# Patient Record
Sex: Female | Born: 1935
Health system: Southern US, Community
[De-identification: ages and names within clinical notes are randomized; demographics above are authoritative.]

## PROBLEM LIST (undated history)

## (undated) DIAGNOSIS — I10 Essential (primary) hypertension: Secondary | ICD-10-CM

## (undated) DIAGNOSIS — K5730 Diverticulosis of large intestine without perforation or abscess without bleeding: Secondary | ICD-10-CM

## (undated) DIAGNOSIS — M1990 Unspecified osteoarthritis, unspecified site: Secondary | ICD-10-CM

## (undated) DIAGNOSIS — E039 Hypothyroidism, unspecified: Secondary | ICD-10-CM

## (undated) DIAGNOSIS — E119 Type 2 diabetes mellitus without complications: Secondary | ICD-10-CM

## (undated) DIAGNOSIS — E7800 Pure hypercholesterolemia, unspecified: Secondary | ICD-10-CM

## (undated) DIAGNOSIS — K859 Acute pancreatitis without necrosis or infection, unspecified: Secondary | ICD-10-CM

## (undated) HISTORY — PX: ABDOMINAL HYSTERECTOMY: SHX81

## (undated) HISTORY — PX: OTHER SURGICAL HISTORY: SHX169

## (undated) HISTORY — PX: ESOPHAGOGASTRODUODENOSCOPY: SHX1529

## (undated) HISTORY — PX: EXCISION OF BREAST BIOPSY: SHX5822

## (undated) HISTORY — PX: VEIN SURGERY: SHX48

## (undated) HISTORY — PX: APPENDECTOMY: SHX54

---

## 1997-02-10 HISTORY — PX: CHOLECYSTECTOMY: SHX55

## 1997-05-15 ENCOUNTER — Ambulatory Visit (HOSPITAL_COMMUNITY): Admission: RE | Admit: 1997-05-15 | Discharge: 1997-05-15 | Payer: Self-pay | Admitting: Specialist

## 1997-07-03 ENCOUNTER — Ambulatory Visit (HOSPITAL_COMMUNITY): Admission: RE | Admit: 1997-07-03 | Discharge: 1997-07-03 | Payer: Self-pay | Admitting: Specialist

## 1999-02-25 ENCOUNTER — Encounter: Admission: RE | Admit: 1999-02-25 | Discharge: 1999-02-25 | Payer: Self-pay | Admitting: Family Medicine

## 1999-02-25 ENCOUNTER — Encounter: Payer: Self-pay | Admitting: Family Medicine

## 1999-12-10 ENCOUNTER — Encounter: Payer: Self-pay | Admitting: Family Medicine

## 1999-12-10 ENCOUNTER — Encounter: Admission: RE | Admit: 1999-12-10 | Discharge: 1999-12-10 | Payer: Self-pay | Admitting: Family Medicine

## 2000-03-16 ENCOUNTER — Ambulatory Visit (HOSPITAL_COMMUNITY): Admission: RE | Admit: 2000-03-16 | Discharge: 2000-03-16 | Payer: Self-pay | Admitting: Family Medicine

## 2000-03-16 ENCOUNTER — Encounter: Payer: Self-pay | Admitting: Family Medicine

## 2000-05-05 ENCOUNTER — Encounter: Admission: RE | Admit: 2000-05-05 | Discharge: 2000-05-05 | Payer: Self-pay | Admitting: Family Medicine

## 2000-05-05 ENCOUNTER — Encounter: Payer: Self-pay | Admitting: Family Medicine

## 2000-12-11 HISTORY — PX: COLONOSCOPY: SHX174

## 2001-04-21 ENCOUNTER — Ambulatory Visit (HOSPITAL_COMMUNITY): Admission: RE | Admit: 2001-04-21 | Discharge: 2001-04-21 | Payer: Self-pay | Admitting: Family Medicine

## 2001-04-21 ENCOUNTER — Encounter: Payer: Self-pay | Admitting: Family Medicine

## 2002-05-16 ENCOUNTER — Ambulatory Visit (HOSPITAL_COMMUNITY): Admission: RE | Admit: 2002-05-16 | Discharge: 2002-05-16 | Payer: Self-pay | Admitting: Family Medicine

## 2002-05-16 ENCOUNTER — Encounter: Payer: Self-pay | Admitting: Family Medicine

## 2003-02-01 ENCOUNTER — Encounter: Admission: RE | Admit: 2003-02-01 | Discharge: 2003-02-01 | Payer: Self-pay | Admitting: Internal Medicine

## 2003-06-14 ENCOUNTER — Ambulatory Visit (HOSPITAL_COMMUNITY): Admission: RE | Admit: 2003-06-14 | Discharge: 2003-06-14 | Payer: Self-pay | Admitting: Obstetrics & Gynecology

## 2004-06-17 ENCOUNTER — Ambulatory Visit (HOSPITAL_COMMUNITY): Admission: RE | Admit: 2004-06-17 | Discharge: 2004-06-17 | Payer: Self-pay | Admitting: Family Medicine

## 2004-12-31 ENCOUNTER — Ambulatory Visit (HOSPITAL_COMMUNITY): Admission: RE | Admit: 2004-12-31 | Discharge: 2004-12-31 | Payer: Self-pay | Admitting: Family Medicine

## 2005-06-18 ENCOUNTER — Ambulatory Visit (HOSPITAL_COMMUNITY): Admission: RE | Admit: 2005-06-18 | Discharge: 2005-06-18 | Payer: Self-pay | Admitting: Family Medicine

## 2006-06-19 ENCOUNTER — Ambulatory Visit (HOSPITAL_COMMUNITY): Admission: RE | Admit: 2006-06-19 | Discharge: 2006-06-19 | Payer: Self-pay | Admitting: Family Medicine

## 2006-09-28 ENCOUNTER — Encounter: Admission: RE | Admit: 2006-09-28 | Discharge: 2006-09-28 | Payer: Self-pay | Admitting: Family Medicine

## 2006-12-31 ENCOUNTER — Ambulatory Visit: Payer: Self-pay | Admitting: Internal Medicine

## 2007-01-11 HISTORY — PX: COLONOSCOPY: SHX174

## 2007-01-14 ENCOUNTER — Ambulatory Visit: Payer: Self-pay | Admitting: Internal Medicine

## 2007-06-23 ENCOUNTER — Ambulatory Visit (HOSPITAL_COMMUNITY): Admission: RE | Admit: 2007-06-23 | Discharge: 2007-06-23 | Payer: Self-pay | Admitting: Family

## 2007-07-02 ENCOUNTER — Encounter: Admission: RE | Admit: 2007-07-02 | Discharge: 2007-07-02 | Payer: Self-pay | Admitting: Family

## 2007-10-14 ENCOUNTER — Encounter: Payer: Self-pay | Admitting: Emergency Medicine

## 2007-10-15 ENCOUNTER — Inpatient Hospital Stay (HOSPITAL_COMMUNITY): Admission: EM | Admit: 2007-10-15 | Discharge: 2007-10-18 | Payer: Self-pay

## 2007-10-27 ENCOUNTER — Encounter: Admission: RE | Admit: 2007-10-27 | Discharge: 2007-10-27 | Payer: Self-pay | Admitting: Orthopedic Surgery

## 2008-07-04 ENCOUNTER — Ambulatory Visit (HOSPITAL_COMMUNITY): Admission: RE | Admit: 2008-07-04 | Discharge: 2008-07-04 | Payer: Self-pay | Admitting: Family Medicine

## 2009-07-12 ENCOUNTER — Ambulatory Visit (HOSPITAL_COMMUNITY): Admission: RE | Admit: 2009-07-12 | Discharge: 2009-07-12 | Payer: Self-pay | Admitting: Family Medicine

## 2010-06-06 ENCOUNTER — Other Ambulatory Visit (HOSPITAL_COMMUNITY): Payer: Self-pay | Admitting: Family Medicine

## 2010-06-06 DIAGNOSIS — Z1231 Encounter for screening mammogram for malignant neoplasm of breast: Secondary | ICD-10-CM

## 2010-06-06 DIAGNOSIS — Z780 Asymptomatic menopausal state: Secondary | ICD-10-CM

## 2010-06-25 NOTE — Consult Note (Signed)
NAMEJOURDIN, Kathleen [name]                 ACCOUNT NO.:  [id number]   MEDICAL RECORD NO.:  [id number]          PATIENT TYPE:  INP   LOCATION:  3030                         FACILITY:  [facility]   PHYSICIAN:  Doralee Albino. Carola Frost, M.D. DATE OF BIRTH:  28-Aug-1931   DATE OF CONSULTATION:  10/15/2007  DATE OF DISCHARGE:                                 CONSULTATION   REQUESTING PHYSICIAN:  Trauma Service.   REASON FOR CONSULTATION:  Left knee hematoma and contusion.   HISTORY OF PRESENT ILLNESS:  Kathleen [name] is a 77 year old Caucasian female  who was at home yesterday and fell down while working in the backyard.  The patient states that she stepped in a hole and landed essentially  flat on her face per the patient's own words.  She was brought initially  to [facility] Emergency Department for evaluation.  She was also placed  in a cervical collar for C-spine precautions.  After being evaluated in  the ED, the Trauma Service was called for consultation as well.  Afterwards, the patient was then transferred to [facility] to  3000 unit.  Based on physical and historical findings from the patient's  exam, it was requested that Orthopedics be consulted for evaluation of  her left knee injury with possible aspiration of the knee.   Currently, Kathleen [name] is in bed.  She appears to be relatively tired.  She does answer questions appropriately and is conversing well.  She  does complain of some left knee pain primarily over the medial aspect.  She does not complain of any numbness or tingling.  She does have  significant complaints of vomiting which she believes is secondary to  pain medication.  Kathleen [name] pain is located primarily about her left  knee.  There is no radiation of pain down her leg or up into her back.  The pain is pretty much sharp in nature and she does get some relief of  pain while she remains still.  Any type of movement does exacerbate the  pain.   PAST MEDICAL HISTORY:   Significant for diabetes, hypertension,  hypothyroidism, and dyslipidemia.   SOCIAL HISTORY:  The patient does not drink.  Does not smoke.  She does  live at home with her husband.   ALLERGIES:  No known drug allergies.   MEDICATIONS:  1. Felodipine.  2. Hydrochlorothiazide.  3. Levothyroxine.  4. Metformin.  5. Potassium chloride.  6. Simvastatin.   PAST SURGICAL HISTORY:  The patient denies.   REVIEW OF SYSTEMS:  As noted above in the HPI.   PHYSICAL EXAMINATION:  VITAL SIGNS:  Temperature 98.0, heart rate 74, BP  113/67, respirations 20, and 93% on room air.  GENERAL:  Upon initial encounter, Kathleen [name] is resting in bed.  She is  in an Aspen neck collar.  She does appear to be fatigued and tired.  She  does, however, answer questions appropriately and accurately.  LUNGS:  Clear.  HEART:  Regular rate and rhythm.  ABDOMEN:  Soft and nontender with positive bowel sounds.  EXTREMITIES:  Initial inspection of  the left knee shows significantly  swollen and ecchymotic knee.  She is exquisitely tender to palpation  along the medial aspect of her left knee.  She does not appear to have  any significant tenderness or palpation of the bony landmarks of the  knee.  Nontender with palpation over the medial and lateral epicondyles  and medial and lateral tibial plateaus.  She is also nontender with  palpation of the patella.  Again, she is most tender along the medial  aspect of her knee.  Kathleen [name] is somewhat able to straighten her knee  and to do complete extension but does have some discomfort with this.  It is very difficult to get an accurate ligamentous exam secondary to  the patient's pain.  Also, it is difficult to further evaluate the  menisci for any additional damage as well.  Deep peroneal nerve,  superficial peroneal nerve, and tibial nerve sensation is intact to  light touch distally.  Extensor hallucis longus, flexor hallucis longus,  anterior tibialis, posterior  tibialis, peroneals, and gastroc-soleus  complex motor function is also intact distally.  There is a palpable  dorsalis pedis pulse.  Extremities warm with appropriate color.  I do  not appreciate any large abrasions or lacerations above her left knee.   LABORATORY DATA:  Sodium 140, potassium 3.4, chloride 108, CO2 27, and  glucose 146.  BUN 11 and creatinine 0.69.  White blood cell is 9.8,  hemoglobin 10.3, hematocrit 29.7, and platelets 127.   Plain films of her left knee do not suggest any evidence of an acute  fracture.  She does have some mild tricompartmental disease of her left  knee.  I do appreciate marked soft tissue swelling and subcu swelling,  but this appears to be extraarticular.  There may be a minimal if that  is left knee joint effusion.   ASSESSMENT:  Left knee hematoma and contusion without fracture.   PLAN:  Primary goal is to begin mobilization of the patient with  physical therapy.  She will be weightbearing as tolerated and can also  perform range of motion as tolerated.  She should continue with some  rest, ice, elevation of her left knee and may even benefit from an Ace  wrap to her left knee as well.  Given the fact that she is in acute pain  secondary to her injury, it is difficult to fully evaluate her injuries  due to the pain.  We will re-examine Kathleen [name] in a few days for her  ligamentous stability as the pain subsides.  Depending on the findings,  we may get Kathleen [name] a hinged brace to provide additional support.  We  will see Kathleen [name] again on Tuesday.  Dr. Thurston Hole and Dr. Sherlean Foot will be  covering over the weekend and we will check on her over the weekend to  make sure she is progressing well with physical therapy.      Mearl Latin, PA      Doralee Albino. Carola Frost, M.D.  Electronically Signed    KWP/MEDQ  D:  10/15/2007  T:  10/16/2007  Job:  [id number]   cc:   Trauma Service

## 2010-06-25 NOTE — H&P (Signed)
Kathleen [name], Kathleen [name]                 ACCOUNT NO.:  [id number]   MEDICAL RECORD NO.:  [id number]          PATIENT TYPE:  INP   LOCATION:  3030                         FACILITY:  [facility]   PHYSICIAN:  Ardeth Sportsman, MD     DATE OF BIRTH:  08-17-1931   DATE OF ADMISSION:  10/15/2007  DATE OF DISCHARGE:                              HISTORY & PHYSICAL   PRIMARY CARE PHYSICIAN:  Dr. Renette Butters in [facility].   REQUESTING PHYSICIAN:  Dr. Estell Harpin at [facility] Emergency Department.   REASON FOR CONSULTATION:  Fall and rolling downhill with neck, abdomen  and knee pain.   HISTORY OF PRESENT ILLNESS:  [name] is a 77 year old female with  diabetes, hyperthyroidism with hypertension, hypothyroidism, question of  diabetes.  She was working out in her back yard helping feed deer by  throwing cantaloupes.  She saw some unusual spot in the yard, tripped  and fell due to a hole.  She started rolling down the hill and tried to  catch herself.  She does not recall passing out, but she says she might  have.  She felt kind of nauseated with some neck pain and belly pain,  especially some left knee pain and swelling.  Based on concerns, her  family brought her in.  She was hemodynamically stable as stated on  route.  Dr. Estell Harpin evaluated her and based on concerns requested a  trauma consultation.   She denies any lightheadedness or dizziness prior to the event.  She  notes that her abdominal pain has faded away and she feels more  comfortable now.  However, her left knee is swollen more and bruised,  and that concerns her.   PAST MEDICAL HISTORY:  1. Hypertension.  2. Hypothyroidism.  3. Hypercholesterolemia.  4. Question of glucose intolerance on oral hypoglycemics versus      diabetes.  5. Anemia.   PAST SURGICAL HISTORY:  She denies.   SOCIAL HISTORY:  No tobacco, alcohol or drug use.  She is married in a  stable relationship.  Here with her husband and her son.   ALLERGIES:   NONE.   MEDICATIONS:  1. Felodipine.  2. Hydrochlorothiazide.  3. Levothyroxine.  4. Metformin.  5. Potassium.  6. Simvastatin.   FAMILY HISTORY:  Negative for any cardiopulmonary disease or significant  stroke issues that she can recall, though she is not the best historian.   REVIEW OF SYSTEMS:  Noted per HPI.  CONSTITUTIONAL:  No fevers, chills,  sweats, weight gain, weight loss.  Eyes:  No vision changes.  No  scotoma.  Ears:  No ringing, tinnitus or hearing loss.  CARDIAC/PULMONARY:  Negative.  GI/GU/GYN/BREASTS/HEME/LYMPH:  Negative.  MUSCULOSKELETAL:  She does have strong knee pain on her left side, but  not on her right.  She has some soreness in her neck along the spinous  processes.  She has does have a history some degenerative joint disease  in her cervical and lumbar spine, but it is rather mild and this seems  to make it worse.  She has some chronic low  back pain which has not  really changed.  RENAL:  Negative.  ENDOCRINE:  She seemed surprised  with the diagnosis of diabetes despite being on metformin, so I do not  know.  She denies really any polyuria or polydipsia.   PHYSICAL EXAMINATION:  VITAL SIGNS:  Temperature 97.9, pulse 89,  respirations 18, blood pressure 130/73, sats any 93-97% on room air.  GENERAL:  She is a well-developed, well-nourished obese female lying in  bed, obviously uncomfortable, but not frankly toxic.  PSYCH:  She is pleasant, interactive with average-below average  intelligence and tends to be vague and quiet, but no evidence of  dementia in terms of psychosis, paranoia.  NEUROLOGICAL:  Cranial II-XII are intact.  Strength is 5/5 and  symmetrical.  No resting or tension tremors.  She is moving all 4  extremities with no sensory or motor focal deficits.  Eyes:  Pupils are  equal, round, and reactive to light.  Extraocular movements are intact.  Sclerae nonicteric or injected.  HEENT:  She is normocephalic.  Her nasopharynx and oropharynx  are clear.  She has some mild abrasions on her left forehead and zygomatic region.  She has no pain along her midface or her jaw.  There is no malocclusion.  She has upper and lower dentures.  Her tympanic membranes are clear.  NECK:  She was already out of a C-spine collar.  She had a decent range  of motion of flexion/extension, but soreness and tenderness when she  tried to turn her head to the right.  She has tenderness along most of  her cervical spine.  I recommended we get a collar back on her.  CHEST:  Clear to auscultation bilaterally.  No wheezes or rhonchi.  No  pain to rib or sternal compression or along her clavicles.  HEART:  Regular rate and rhythm.  No murmurs, clicks or rubs.  ABDOMEN:  Obese, but completely soft, nontender distended.  No umbilical  hernias.  GENITALIA/RECTAL:  Refused per patient's request.  Pelvis is stable.  EXTREMITIES:  She has normal range of motion of her shoulders and wrists  as well as right hip, knee and ankle, left hip and left ankle.  Her left  knee is very swollen with ecchymosis.  She has some tenderness along her  medial aspect of the knee, but not on the lateral aspect.  She is stiff  medially, but I cannot detect anterior or posterior drawer sign.  Her  distal neurovascular pulses and exam are intact.  LYMPHATIC:  No axillary or groin lymphadenopathy.  BREASTS:  No obvious sores, lesions or nipple discharge.   LABORATORY VALUES:  Her chemistries is in normal range except for  potassium of 3.4, but she has chronic hypokalemia.  White count is 10.6,  hemoglobin of 12.3.  Urinalysis negative.  INR is 1.1.   DIAGNOSTICS:  1. She has a left knee film which shows some mild osteopenia,      osteoarthritis, joint effusion with a large subcutaneous hematoma,      but no evidence of any acute fracture.  2. CT of the head shows no intracranial hemorrhage.  There is a little      bit of left mastoid opacification.  There is no definite  fracture,      although the radiologist notes this could be sign of delayed occult      fracture.  The radiologist I discussed it with wondered if she may      have  some subluxation of the left mandible at the TMJ.  Neck is      negative for any fracture acutely, but there are multilevel facet      degenerative changes.  3. CT of the pelvis.  There was some question of possible umbilical      hernia, although that was not really appreciated to my examination.      She has a moderate-sized liver cyst that simple and not lobulated.      She is status post cholecystectomy.  There is no free fluid or free      air.  No evidence of any solid organ injury.   ASSESSMENT:  A 77 year old obese female who is status post fall with  numerous mild concerning issues.   PLAN:  1. A 23-hour observation.  2. Return back to C. collar.  3. Get flexion and extension C-spine films, and reevaluate.  Perhaps      she has some distracting pain.  My instinct says she does not have      any significant injury, but the safest thing to do is put her back      in the collar for right now.  4. Get a chest x-ray.  I do not think that has been evaluated.  5. Keep the left leg elevated, ice and immobilize.  Consider      reevaluation if she is not better and maybe orthopedic      consultation.  6. PT evaluation and clearance.  7. Blood pressure control.  8. Get a hemoglobin A1c and put her on sliding-scale insulin for this      question of diabetes.  If her hemoglobin A1c is normal and her      glucose is normal, that is an erroneous diagnosis.  But if it is      correct, then we need to make sure it is under control.  9. Follow her mental status.  10.Follow her nausea.  She does not seem to be too nauseated right      now.      Ardeth Sportsman, MD  Electronically Signed     SCG/MEDQ  D:  10/14/2007  T:  10/15/2007  Job:  [id number]   cc:   Dr. Albin Fischer [facility]

## 2010-06-25 NOTE — Discharge Summary (Signed)
Kathleen [name], Kathleen [name]                 ACCOUNT NO.:  [id number]   MEDICAL RECORD NO.:  [id number]          PATIENT TYPE:  INP   LOCATION:  3030                         FACILITY:  [facility]   PHYSICIAN:  Wilmon Arms. Corliss Skains, M.D. DATE OF BIRTH:  October 17, 1931   DATE OF ADMISSION:  10/15/2007  DATE OF DISCHARGE:  10/18/2007                               DISCHARGE SUMMARY   DISCHARGE DIAGNOSES:  1. Fall.  2. Left knee injury with hemarthrosis.  3. Facial abrasion.  4. Cervical strain.  5. Hypothyroidism.  6. Hypertension.  7. Dyslipidemia.  8. Diabetes.  9. Acute blood loss anemia.   CONSULTANTS:  Dr. Carola Frost of Orthopedic Surgery.   PROCEDURES:  None.   HISTORY OF PRESENT ILLNESS:  This is a 77 year old white female who was  in her yard when she stepped in a hill, fell and then rolled down the  hill part way.  She comes in, transferred from [facility] while it was  felt the patient could not go home because of the knee swelling as well  as a fair amount of nausea the patient was having.   HOSPITAL COURSE:  The patient's hospital course was plagued by  intermittent nausea while she was here.  She notes that she has had a  lifelong sensitivity to medications, which often make her nauseated.  However, we were able to control her pain, mostly with ibuprofen and  Ultram for breakthrough.  She did get Phenergan at one point and  suffered some hallucinations with that medicine.  She worked with  physical and occupational therapy who recommended some home health  therapies as well as some equipment needs.  Eventually, she was able to  ambulate well enough and had her nausea under sufficient control that we  were able to discharge her home in good condition.   DISCHARGE MEDICATIONS:  1. Ibuprofen 800 mg tablets take 1 p.o. t.i.d. with food, #90 with no      refill.  2. Ultram 50 mg tablets take one half to one p.o. q.4 h. p.r.n. pain,      #30 with no refill.  3. Zofran 4 mg take 1 p.o. q.4 h.  p.r.n. nausea, #20 with no refill.   FOLLOWUP:  The patient will follow up with Dr. Carola Frost and will call his  office for an appointment.  She may call the Trauma Service with any  questions or concerns, but followup with [place] will be on an as needed  basis.      Earney Hamburg, P.A.      Wilmon Arms. Tsuei, M.D.  Electronically Signed    MJ/MEDQ  D:  10/18/2007  T:  10/18/2007  Job:  [id number]   cc:   Doralee Albino. Carola Frost, M.D.

## 2010-07-15 ENCOUNTER — Ambulatory Visit (HOSPITAL_COMMUNITY)
Admission: RE | Admit: 2010-07-15 | Discharge: 2010-07-15 | Disposition: A | Discharge status: Home / Self Care | Payer: Medicare Other | Source: Ambulatory Visit | Attending: Family Medicine | Admitting: Family Medicine

## 2010-07-15 DIAGNOSIS — Z1231 Encounter for screening mammogram for malignant neoplasm of breast: Secondary | ICD-10-CM

## 2010-07-15 DIAGNOSIS — Z13820 Encounter for screening for osteoporosis: Secondary | ICD-10-CM | POA: Insufficient documentation

## 2010-07-15 DIAGNOSIS — Z780 Asymptomatic menopausal state: Secondary | ICD-10-CM

## 2010-11-13 LAB — URINALYSIS, ROUTINE W REFLEX MICROSCOPIC
Bilirubin Urine: NEGATIVE
Nitrite: NEGATIVE
Urobilinogen, UA: 1
pH: 7

## 2010-11-13 LAB — GLUCOSE, CAPILLARY
Glucose-Capillary: 100 — ABNORMAL HIGH
Glucose-Capillary: 101 — ABNORMAL HIGH
Glucose-Capillary: 114 — ABNORMAL HIGH
Glucose-Capillary: 133 — ABNORMAL HIGH
Glucose-Capillary: 134 — ABNORMAL HIGH
Glucose-Capillary: 139 — ABNORMAL HIGH
Glucose-Capillary: 142 — ABNORMAL HIGH
Glucose-Capillary: 161 — ABNORMAL HIGH
Glucose-Capillary: 97

## 2010-11-13 LAB — CBC
HCT: 27.4 — ABNORMAL LOW
Hemoglobin: 10.3 — ABNORMAL LOW
MCHC: 34.7
MCV: 88.5
MCV: 89.8
Platelets: 123 — ABNORMAL LOW
Platelets: 197
RBC: 3.38 — ABNORMAL LOW
RDW: 13.8
RDW: 13.8

## 2010-11-13 LAB — BASIC METABOLIC PANEL
BUN: 11
Calcium: 8.4
Chloride: 108
Creatinine, Ser: 0.69
GFR calc non Af Amer: >60
Glucose, Bld: 146 — ABNORMAL HIGH

## 2010-11-13 LAB — HEMOGLOBIN A1C
Hgb A1c MFr Bld: 6.2 — ABNORMAL HIGH
Mean Plasma Glucose: 131

## 2010-11-13 LAB — DIFFERENTIAL
Basophils Absolute: 0
Basophils Relative: 0
Eosinophils Absolute: 0.1
Lymphs Abs: 1.8
Monocytes Absolute: 0.8
Monocytes Relative: 8
Neutrophils Relative %: 74

## 2010-11-13 LAB — PROTIME-INR
INR: 1.1
Prothrombin Time: 14

## 2010-11-13 LAB — POCT I-STAT, CHEM 8
BUN: 21
Glucose, Bld: 156 — ABNORMAL HIGH

## 2010-11-13 LAB — APTT: aPTT: 32

## 2010-11-13 LAB — URINE MICROSCOPIC-ADD ON

## 2011-06-24 ENCOUNTER — Other Ambulatory Visit (HOSPITAL_COMMUNITY): Payer: Self-pay | Admitting: Family Medicine

## 2011-06-24 DIAGNOSIS — Z1231 Encounter for screening mammogram for malignant neoplasm of breast: Secondary | ICD-10-CM

## 2011-07-24 ENCOUNTER — Ambulatory Visit (HOSPITAL_COMMUNITY)
Admission: RE | Admit: 2011-07-24 | Discharge: 2011-07-24 | Disposition: A | Discharge status: Home / Self Care | Payer: Medicare Other | Source: Ambulatory Visit | Attending: Family Medicine | Admitting: Family Medicine

## 2011-07-24 DIAGNOSIS — Z1231 Encounter for screening mammogram for malignant neoplasm of breast: Secondary | ICD-10-CM | POA: Insufficient documentation

## 2012-01-26 ENCOUNTER — Encounter (HOSPITAL_COMMUNITY): Payer: Self-pay | Admitting: *Deleted

## 2012-01-26 ENCOUNTER — Inpatient Hospital Stay (HOSPITAL_COMMUNITY)
Admission: EM | Admit: 2012-01-26 | Discharge: 2012-01-30 | DRG: 439 | Disposition: A | Discharge status: Home / Self Care | Payer: Medicare Other | Attending: Internal Medicine | Admitting: Internal Medicine

## 2012-01-26 DIAGNOSIS — Z79899 Other long term (current) drug therapy: Secondary | ICD-10-CM

## 2012-01-26 DIAGNOSIS — Z794 Long term (current) use of insulin: Secondary | ICD-10-CM

## 2012-01-26 DIAGNOSIS — I10 Essential (primary) hypertension: Secondary | ICD-10-CM | POA: Diagnosis present

## 2012-01-26 DIAGNOSIS — Z6831 Body mass index (BMI) 31.0-31.9, adult: Secondary | ICD-10-CM

## 2012-01-26 DIAGNOSIS — E119 Type 2 diabetes mellitus without complications: Secondary | ICD-10-CM | POA: Diagnosis present

## 2012-01-26 DIAGNOSIS — E669 Obesity, unspecified: Secondary | ICD-10-CM | POA: Diagnosis present

## 2012-01-26 DIAGNOSIS — R5381 Other malaise: Secondary | ICD-10-CM | POA: Diagnosis present

## 2012-01-26 DIAGNOSIS — Z833 Family history of diabetes mellitus: Secondary | ICD-10-CM

## 2012-01-26 DIAGNOSIS — E1159 Type 2 diabetes mellitus with other circulatory complications: Secondary | ICD-10-CM | POA: Diagnosis present

## 2012-01-26 DIAGNOSIS — R17 Unspecified jaundice: Secondary | ICD-10-CM | POA: Diagnosis present

## 2012-01-26 DIAGNOSIS — K859 Acute pancreatitis without necrosis or infection, unspecified: Principal | ICD-10-CM | POA: Diagnosis present

## 2012-01-26 DIAGNOSIS — R7402 Elevation of levels of lactic acid dehydrogenase (LDH): Secondary | ICD-10-CM | POA: Diagnosis present

## 2012-01-26 DIAGNOSIS — R7989 Other specified abnormal findings of blood chemistry: Secondary | ICD-10-CM | POA: Diagnosis present

## 2012-01-26 DIAGNOSIS — E039 Hypothyroidism, unspecified: Secondary | ICD-10-CM | POA: Diagnosis present

## 2012-01-26 DIAGNOSIS — R7401 Elevation of levels of liver transaminase levels: Secondary | ICD-10-CM | POA: Diagnosis present

## 2012-01-26 DIAGNOSIS — E876 Hypokalemia: Secondary | ICD-10-CM | POA: Diagnosis present

## 2012-01-26 DIAGNOSIS — E7800 Pure hypercholesterolemia, unspecified: Secondary | ICD-10-CM | POA: Diagnosis present

## 2012-01-26 DIAGNOSIS — E785 Hyperlipidemia, unspecified: Secondary | ICD-10-CM | POA: Diagnosis present

## 2012-01-26 HISTORY — DX: Diverticulosis of large intestine without perforation or abscess without bleeding: K57.30

## 2012-01-26 HISTORY — DX: Hypothyroidism, unspecified: E03.9

## 2012-01-26 HISTORY — DX: Type 2 diabetes mellitus without complications: E11.9

## 2012-01-26 HISTORY — DX: Pure hypercholesterolemia, unspecified: E78.00

## 2012-01-26 HISTORY — DX: Essential (primary) hypertension: I10

## 2012-01-26 LAB — CBC WITH DIFFERENTIAL/PLATELET
Eosinophils Relative: 0 % (ref 0–5)
Lymphocytes Relative: 4 % — ABNORMAL LOW (ref 12–46)
Monocytes Absolute: 0.2 10*3/uL (ref 0.1–1.0)
Monocytes Relative: 3 % (ref 3–12)
Neutrophils Relative %: 93 % — ABNORMAL HIGH (ref 43–77)
Platelets: 135 10*3/uL — ABNORMAL LOW (ref 150–400)
RBC: 4.7 MIL/uL (ref 3.87–5.11)
WBC Morphology: INCREASED
WBC: 6.6 10*3/uL (ref 4.0–10.5)

## 2012-01-26 LAB — COMPREHENSIVE METABOLIC PANEL
ALT: 68 U/L — ABNORMAL HIGH (ref 0–35)
AST: 103 U/L — ABNORMAL HIGH (ref 0–37)
CO2: 26 mEq/L (ref 19–32)
Calcium: 9 mg/dL (ref 8.4–10.5)
Chloride: 100 mEq/L (ref 96–112)
GFR calc non Af Amer: 83 mL/min — ABNORMAL LOW (ref >90)
Potassium: 3.4 mEq/L — ABNORMAL LOW (ref 3.5–5.1)
Sodium: 137 mEq/L (ref 135–145)

## 2012-01-26 MED ORDER — SODIUM CHLORIDE 0.9 % IV SOLN
Freq: Once | INTRAVENOUS | Status: AC
  Administered 2012-01-27: 01:00:00 via INTRAVENOUS

## 2012-01-26 MED ORDER — SODIUM CHLORIDE 0.9 % IV SOLN
Freq: Once | INTRAVENOUS | Status: AC
  Administered 2012-01-26: 23:00:00 via INTRAVENOUS

## 2012-01-26 MED ORDER — ONDANSETRON HCL 4 MG/2ML IJ SOLN: 4.0000 mg | Freq: Once | INTRAMUSCULAR | Status: DC

## 2012-01-26 MED ORDER — SODIUM CHLORIDE 0.9 % IV BOLUS (SEPSIS)
500.0000 mL | Freq: Once | INTRAVENOUS | Status: AC
  Administered 2012-01-26: 500 mL via INTRAVENOUS

## 2012-01-26 MED ORDER — DIPHENOXYLATE-ATROPINE 2.5-0.025 MG PO TABS
2.0000 | ORAL_TABLET | Freq: Once | ORAL | Status: AC
  Administered 2012-01-26: 2 via ORAL
  Filled 2012-01-26: qty 2

## 2012-01-26 MED ORDER — PANTOPRAZOLE SODIUM 40 MG IV SOLR
40.0000 mg | Freq: Once | INTRAVENOUS | Status: AC
  Administered 2012-01-26: 40 mg via INTRAVENOUS
  Filled 2012-01-26: qty 40

## 2012-01-26 MED ORDER — ONDANSETRON HCL 4 MG/2ML IJ SOLN
4.0000 mg | Freq: Once | INTRAMUSCULAR | Status: AC
  Administered 2012-01-26: 4 mg via INTRAVENOUS
  Filled 2012-01-26: qty 2

## 2012-01-26 MED ORDER — MORPHINE SULFATE 4 MG/ML IJ SOLN
4.0000 mg | Freq: Once | INTRAMUSCULAR | Status: AC
  Administered 2012-01-26: 4 mg via INTRAVENOUS
  Filled 2012-01-26: qty 1

## 2012-01-26 MED ORDER — MORPHINE SULFATE 4 MG/ML IJ SOLN
2.0000 mg | Freq: Once | INTRAMUSCULAR | Status: AC
  Administered 2012-01-26: 2 mg via INTRAVENOUS
  Filled 2012-01-26: qty 1

## 2012-01-26 NOTE — ED Notes (Signed)
Pt with abd pain with n/v/d and chills starting today

## 2012-01-26 NOTE — ED Notes (Signed)
Pt reporting pain and cramping in abdomen starting about 10 today.  Reporting n/v/d as well today.

## 2012-01-26 NOTE — ED Provider Notes (Signed)
History    This chart was scribed for [employer]. Colon Branch, MD, MD by Smitty Pluck, ED Scribe. The patient was seen in room APA18 and the patient's care was started at 11:17PM.   CSN: [id number]  Arrival date & time 01/26/12  2220      Chief Complaint  Patient presents with  . Emesis  . Diarrhea  . Abdominal Pain    (Consider location/radiation/quality/duration/timing/severity/associated sxs/prior treatment) The history is provided by the patient. No language interpreter was used.   Kathleen [name] is a 78 y.o. female with hx of DM, HTN and high cholesterol who presents to the Emergency Department complaining of constant, moderate emesis onset today around 10:30AM. Pt reports having upper abdominal cramping, nausea, chills and vomiting multiple times today. She reports taking Imodium without relief. Pt reports having sick contact with family members (same symptoms). Pt denies cough, sore throat, SOB, headache, chest pain, blood in stool, hemoptysis and any other pain.    PCP Dr. Arlyce Dice  Past Medical History  Diagnosis Date  . Hypertension   . High cholesterol   . Diabetes mellitus without complication     "borderline"    Past Surgical History  Procedure Date  . Appendectomy   . Vein surgery   . Cholecystectomy   . Bladder tac   . Abdominal hysterectomy   . Excision of breast biopsy     History reviewed. No pertinent family history.  History  Substance Use Topics  . Smoking status: Never Smoker   . Smokeless tobacco: Not on file  . Alcohol Use: No    OB History    Grav Para Term Preterm Abortions TAB SAB Ect Mult Living                  Review of Systems  Constitutional: Negative for fever.       10 Systems reviewed and are negative for acute change except as noted in the HPI.  HENT: Negative for congestion.   Eyes: Negative for discharge and redness.  Respiratory: Negative for cough and shortness of breath.   Cardiovascular: Negative for chest pain.   Gastrointestinal: Positive for nausea, vomiting, abdominal pain and diarrhea.  Musculoskeletal: Negative for back pain.  Skin: Negative for rash.  Neurological: Negative for syncope, numbness and headaches.  Psychiatric/Behavioral:       No behavior change.  10 Systems reviewed and all are negative for acute change except as noted in the HPI.    Allergies  Review of patient's allergies indicates no known allergies.  Home Medications  No current outpatient prescriptions on file.  BP 131/57  Pulse 88  Temp 97.5 F (36.4 C) (Oral)  Resp 20  Ht 5\' 3"  (1.6 m)  Wt 170 lb (77.111 kg)  BMI 30.11 kg/m2  SpO2 99%  Physical Exam  Nursing note and vitals reviewed. Constitutional: She is oriented to person, place, and time. She appears well-developed. No distress.       Pt is ill appearing   HENT:  Head: Normocephalic and atraumatic.  Eyes: Conjunctivae normal are normal. Right eye exhibits no discharge. Left eye exhibits no discharge.  Neck: Normal range of motion. Neck supple.  Cardiovascular: Normal rate, regular rhythm and normal heart sounds.   Pulmonary/Chest: Effort normal and breath sounds normal. No respiratory distress. She exhibits no tenderness.  Abdominal: Soft. She exhibits no distension. There is tenderness. There is no rebound.       Hyperactive bowel sounds, epigastric tenderness to palpation  Musculoskeletal: She exhibits no tenderness.       Baseline ROM, no obvious new focal weakness.  Neurological: She is alert and oriented to person, place, and time.       Mental status and motor strength appears baseline for patient and situation.  Skin: Skin is warm and dry. No rash noted.  Psychiatric: She has a normal mood and affect. Her behavior is normal.    ED Course  Procedures (including critical care time) DIAGNOSTIC STUDIES: Oxygen Saturation is 99% on room air, normal by my interpretation.    COORDINATION OF CARE: 11:21 PM Discussed ED treatment with pt   11:21 PM Ordered:     . diphenoxylate-atropine  2 tablet Oral Once  . ondansetron (ZOFRAN) IV  4 mg Intravenous Once  . pantoprazole (PROTONIX) IV  40 mg Intravenous Once    Results for orders placed during the hospital encounter of 01/26/12  CBC WITH DIFFERENTIAL      Component Value Range   WBC 6.6  4.0 - 10.5 K/uL   RBC 4.70  3.87 - 5.11 MIL/uL   Hemoglobin 14.1  12.0 - 15.0 g/dL   HCT 40.9  81.1 - 91.4 %   MCV 89.1  78.0 - 100.0 fL   MCH 30.0  26.0 - 34.0 pg   MCHC 33.7  30.0 - 36.0 g/dL   RDW 78.2  95.6 - 21.3 %   Platelets 135 (*) 150 - 400 K/uL   Neutrophils Relative 93 (*) 43 - 77 %   Lymphocytes Relative 4 (*) 12 - 46 %   Monocytes Relative 3  3 - 12 %   Eosinophils Relative 0  0 - 5 %   Basophils Relative 0  0 - 1 %   Neutro Abs 6.1  1.7 - 7.7 K/uL   Lymphs Abs 0.3 (*) 0.7 - 4.0 K/uL   Monocytes Absolute 0.2  0.1 - 1.0 K/uL   Eosinophils Absolute 0.0  0.0 - 0.7 K/uL   Basophils Absolute 0.0  0.0 - 0.1 K/uL   WBC Morphology INCREASED BANDS (>20% BANDS)    COMPREHENSIVE METABOLIC PANEL      Component Value Range   Sodium 137  135 - 145 mEq/L   Potassium 3.4 (*) 3.5 - 5.1 mEq/L   Chloride 100  96 - 112 mEq/L   CO2 26  19 - 32 mEq/L   Glucose, Bld 207 (*) 70 - 99 mg/dL   BUN 17  6 - 23 mg/dL   Creatinine, Ser 0.86  0.50 - 1.10 mg/dL   Calcium 9.0  8.4 - 57.8 mg/dL   Total Protein 7.1  6.0 - 8.3 g/dL   Albumin 3.9  3.5 - 5.2 g/dL   AST 469 (*) 0 - 37 U/L   ALT 68 (*) 0 - 35 U/L   Alkaline Phosphatase 77  39 - 117 U/L   Total Bilirubin 1.2  0.3 - 1.2 mg/dL   GFR calc non Af Amer 83 (*) >90 mL/min   GFR calc Af Amer >90  >90 mL/min  LIPASE, BLOOD      Component Value Range   Lipase 2119 (*) 11 - 59 U/L     12:13 AM:  T/C toDr. Orvan Falconer, hospitalist case discussed, including:  HPI, pertinent PM/SHx, VS/PE, dx testing, ED course and treatment.  Agreeable to admission.  Requests to write temporary orders, med surg bed.   MDM  Patient with sudden onset  abdominal pain, nausea and vomiting with development of diarrhea. Labs  indicate a pancreatitis. Will arrange for admission. Given analgesic, antiemetic, PPI and antidiarrheal.Spoke with Dr. Orvan Falconer, hospitalist who will admit the patient. Dx testing d/w pt and husband. Questions answered.  Verb understanding, agreeable to admission.Pt stable in ED with no significant deterioration in condition.The patient appears reasonably stabilized for admission considering the current resources, flow, and capabilities available in the ED at this time, and I doubt any other [facility] requiring further screening and/or treatment in the ED prior to admission.  I personally performed the services described in this documentation, which was scribed in my presence. The recorded information has been reviewed and considered.   MDM Reviewed: nursing note and vitals Interpretation: labs            Nicoletta Dress. Colon Branch, MD 01/27/12 1610

## 2012-01-27 ENCOUNTER — Encounter (HOSPITAL_COMMUNITY): Payer: Self-pay | Admitting: *Deleted

## 2012-01-27 ENCOUNTER — Inpatient Hospital Stay (HOSPITAL_COMMUNITY): Payer: Medicare Other

## 2012-01-27 DIAGNOSIS — E039 Hypothyroidism, unspecified: Secondary | ICD-10-CM | POA: Diagnosis present

## 2012-01-27 DIAGNOSIS — E876 Hypokalemia: Secondary | ICD-10-CM | POA: Diagnosis present

## 2012-01-27 DIAGNOSIS — K859 Acute pancreatitis without necrosis or infection, unspecified: Principal | ICD-10-CM | POA: Diagnosis present

## 2012-01-27 DIAGNOSIS — R109 Unspecified abdominal pain: Secondary | ICD-10-CM

## 2012-01-27 DIAGNOSIS — R197 Diarrhea, unspecified: Secondary | ICD-10-CM

## 2012-01-27 DIAGNOSIS — I10 Essential (primary) hypertension: Secondary | ICD-10-CM

## 2012-01-27 DIAGNOSIS — E785 Hyperlipidemia, unspecified: Secondary | ICD-10-CM | POA: Diagnosis present

## 2012-01-27 DIAGNOSIS — K838 Other specified diseases of biliary tract: Secondary | ICD-10-CM

## 2012-01-27 DIAGNOSIS — R17 Unspecified jaundice: Secondary | ICD-10-CM

## 2012-01-27 DIAGNOSIS — E1159 Type 2 diabetes mellitus with other circulatory complications: Secondary | ICD-10-CM | POA: Diagnosis present

## 2012-01-27 LAB — LIPID PANEL
LDL Cholesterol: 65 mg/dL (ref 0–99)
Triglycerides: 81 mg/dL (ref <150)
VLDL: 16 mg/dL (ref 0–40)

## 2012-01-27 LAB — CBC
MCV: 88.5 fL (ref 78.0–100.0)
Platelets: 116 10*3/uL — ABNORMAL LOW (ref 150–400)
RBC: 4.26 MIL/uL (ref 3.87–5.11)
RDW: 13.3 % (ref 11.5–15.5)
WBC: 4.3 10*3/uL (ref 4.0–10.5)

## 2012-01-27 LAB — URINALYSIS, ROUTINE W REFLEX MICROSCOPIC
Glucose, UA: NEGATIVE mg/dL
Protein, ur: NEGATIVE mg/dL
Specific Gravity, Urine: 1.02 (ref 1.005–1.030)

## 2012-01-27 LAB — GLUCOSE, CAPILLARY
Glucose-Capillary: 114 mg/dL — ABNORMAL HIGH (ref 70–99)
Glucose-Capillary: 109 mg/dL — ABNORMAL HIGH (ref 70–99)
Glucose-Capillary: 99 mg/dL (ref 70–99)
Glucose-Capillary: 79 mg/dL (ref 70–99)

## 2012-01-27 LAB — COMPREHENSIVE METABOLIC PANEL
ALT: 413 U/L — ABNORMAL HIGH (ref 0–35)
AST: 563 U/L — ABNORMAL HIGH (ref 0–37)
Albumin: 3.3 g/dL — ABNORMAL LOW (ref 3.5–5.2)
Albumin: 2.9 g/dL — ABNORMAL LOW (ref 3.5–5.2)
Alkaline Phosphatase: 107 U/L (ref 39–117)
BUN: 8 mg/dL (ref 6–23)
CO2: 25 mEq/L (ref 19–32)
Calcium: 8 mg/dL — ABNORMAL LOW (ref 8.4–10.5)
Chloride: 103 mEq/L (ref 96–112)
GFR calc Af Amer: >90 mL/min (ref >90)
GFR calc non Af Amer: 87 mL/min — ABNORMAL LOW (ref >90)
Glucose, Bld: 99 mg/dL (ref 70–99)
Potassium: 3.1 mEq/L — ABNORMAL LOW (ref 3.5–5.1)
Sodium: 137 mEq/L (ref 135–145)
Sodium: 137 mEq/L (ref 135–145)
Total Bilirubin: 2.1 mg/dL — ABNORMAL HIGH (ref 0.3–1.2)
Total Protein: 5.5 g/dL — ABNORMAL LOW (ref 6.0–8.3)

## 2012-01-27 LAB — HEMOGLOBIN A1C: Mean Plasma Glucose: 134 mg/dL — ABNORMAL HIGH (ref <117)

## 2012-01-27 LAB — BILIRUBIN, FRACTIONATED(TOT/DIR/INDIR)
Bilirubin, Direct: 1.6 mg/dL — ABNORMAL HIGH (ref 0.0–0.3)
Total Bilirubin: 2.1 mg/dL — ABNORMAL HIGH (ref 0.3–1.2)

## 2012-01-27 LAB — URINE MICROSCOPIC-ADD ON

## 2012-01-27 LAB — MAGNESIUM: Magnesium: 1.6 mg/dL (ref 1.5–2.5)

## 2012-01-27 MED ORDER — ENOXAPARIN SODIUM 40 MG/0.4ML ~~LOC~~ SOLN
40.0000 mg | SUBCUTANEOUS | Status: DC
  Administered 2012-01-27: 40 mg via SUBCUTANEOUS
  Filled 2012-01-27 (×2): qty 0.4

## 2012-01-27 MED ORDER — HYDROMORPHONE HCL PF 1 MG/ML IJ SOLN
0.5000 mg | INTRAMUSCULAR | Status: DC | PRN
  Administered 2012-01-27: 1 mg via INTRAVENOUS
  Filled 2012-01-27: qty 1

## 2012-01-27 MED ORDER — PANTOPRAZOLE SODIUM 40 MG IV SOLR
40.0000 mg | Freq: Every day | INTRAVENOUS | Status: DC
  Administered 2012-01-28: 40 mg via INTRAVENOUS
  Filled 2012-01-27: qty 40

## 2012-01-27 MED ORDER — ONDANSETRON HCL 4 MG/2ML IJ SOLN
4.0000 mg | INTRAMUSCULAR | Status: DC | PRN
  Administered 2012-01-27: 4 mg via INTRAVENOUS
  Filled 2012-01-27: qty 2

## 2012-01-27 MED ORDER — MORPHINE SULFATE 4 MG/ML IJ SOLN: 2.0000 mg | Freq: Once | INTRAMUSCULAR | Status: DC

## 2012-01-27 MED ORDER — INSULIN ASPART 100 UNIT/ML ~~LOC~~ SOLN
0.0000 [IU] | SUBCUTANEOUS | Status: DC
  Administered 2012-01-27 – 2012-01-29 (×3): 1 [IU] via SUBCUTANEOUS
  Administered 2012-01-29: 2 [IU] via SUBCUTANEOUS
  Administered 2012-01-29: 1 [IU] via SUBCUTANEOUS
  Administered 2012-01-29 (×2): 2 [IU] via SUBCUTANEOUS
  Administered 2012-01-29: 1 [IU] via SUBCUTANEOUS
  Administered 2012-01-30: 2 [IU] via SUBCUTANEOUS

## 2012-01-27 MED ORDER — SODIUM CHLORIDE 0.9 % IV SOLN
INTRAVENOUS | Status: AC
  Administered 2012-01-27: 02:00:00 via INTRAVENOUS

## 2012-01-27 MED ORDER — PANTOPRAZOLE SODIUM 40 MG IV SOLR
40.0000 mg | INTRAVENOUS | Status: DC
  Administered 2012-01-27: 40 mg via INTRAVENOUS
  Filled 2012-01-27: qty 40

## 2012-01-27 MED ORDER — IOHEXOL 300 MG/ML  SOLN
100.0000 mL | Freq: Once | INTRAMUSCULAR | Status: AC | PRN
  Administered 2012-01-27: 100 mL via INTRAVENOUS

## 2012-01-27 MED ORDER — POTASSIUM CHLORIDE IN NACL 20-0.9 MEQ/L-% IV SOLN
INTRAVENOUS | Status: DC
  Administered 2012-01-27 – 2012-01-28 (×3): via INTRAVENOUS

## 2012-01-27 MED ORDER — LEVOTHYROXINE SODIUM 100 MCG IV SOLR
12.5000 ug | Freq: Every day | INTRAVENOUS | Status: DC
  Administered 2012-01-27 – 2012-01-28 (×2): 12.5 ug via INTRAVENOUS
  Filled 2012-01-27 (×2): qty 0.63

## 2012-01-27 MED ORDER — POTASSIUM CHLORIDE 10 MEQ/100ML IV SOLN
10.0000 meq | INTRAVENOUS | Status: AC
  Administered 2012-01-27 (×2): 10 meq via INTRAVENOUS
  Filled 2012-01-27 (×2): qty 100

## 2012-01-27 MED ORDER — SODIUM CHLORIDE 0.9 % IV BOLUS (SEPSIS)
1000.0000 mL | Freq: Once | INTRAVENOUS | Status: AC
  Administered 2012-01-27: 1000 mL via INTRAVENOUS

## 2012-01-27 NOTE — Consult Note (Signed)
Pt clinically improved in setting of increasing liver enzymes: mixed pattern. I PERSONALLY REVIEWED CT with Dr. Tyron Russell. No definite CBD stone. CBD 8 mm. MILD FULLNESS AT HOP. CONTINUE NPO EXCEPT ICE CHIPS. RECHECK HFP/LIPASE IN AM. IF REMAIN ELEVATED WOULD PERFORM ERCP AS IP. IF TRENDING DOWN, ERCP AS OP. BEING UNASYN IF DEVELOPS A FEVER.  PLT COUNT DROPPING ON LOVENOX. STOP LOVENOX. BIL SCDs.

## 2012-01-27 NOTE — Progress Notes (Signed)
     Subjective: This lady is 78 years old was admitted yesterday with pancreatitis. She tells me that she has had her gallbladder taken out, but cannot tell me  when. She still appears to be in some discomfort.           Physical Exam: Blood pressure 136/62, pulse 77, temperature 98.8 F (37.1 C), temperature source Oral, resp. rate 18, height 5\' 3"  (1.6 m), weight 78.2 kg (172 lb 6.4 oz), SpO2 93.00%. She does look systemically well and is not toxic or septic. Her abdomen is actually soft and not significantly tender. There is no rigidity or rebound. Bowel sounds are heard. Lung fields are clear. Heart sounds are present without gallop rhythm. She is not jaundiced. She is alert and orientated without any focal neurological signs.   Investigations:  No results found for this or any previous visit (from the past 240 hour(s)).   Basic Metabolic Panel:  Basename 01/27/12 0449 01/26/12 2237  NA 137 137  K 3.1* 3.4*  CL 103 100  CO2 25 26  GLUCOSE 130* 207*  BUN 11 17  CREATININE 0.56 0.64  CALCIUM 8.3* 9.0  MG 1.6 --  PHOS -- --   Liver Function Tests:  [facility] 01/27/12 0449 01/26/12 2237  AST 563* 103*  ALT 413* 68*  ALKPHOS 107 77  BILITOT 2.1*2.1* 1.2  PROT 6.1 7.1  ALBUMIN 3.3* 3.9     CBC:  Basename 01/27/12 0449 01/26/12 2237  WBC 4.3 6.6  NEUTROABS -- 6.1  HGB 12.5 14.1  HCT 37.7 41.9  MCV 88.5 89.1  PLT 116* 135*    US Abdomen Complete  01/27/2012  *RADIOLOGY REPORT*  Clinical Data:  Acute pancreatitis, elevated LFTs; past history hypertension, diabetes, hypercholesterolemia, cholecystectomy, appendectomy  ULTRASOUND ABDOMEN:  Technique:  Sonography of upper abdominal structures was performed.  Comparison:  None  Gallbladder:  Surgically absent  Common bile duct:  8 mm diameter, which may be normal post cholecystectomy  Liver:  Echogenic, likely fatty infiltration, though this can be seen with cirrhosis and certain infiltrative disorders.  Cyst  right lobe 5.7 x 4.4 x 5.0 cm.  Hepatopetal portal venous flow.  No definite solid mass lesion.  IVC:  Normal appearance  Pancreas:  Heterogeneous echogenicity without mass  Spleen:  Normal morphology, 8.5 cm length.  Right kidney:  11.2 cm length. Normal morphology without mass or hydronephrosis.  Left kidney:  10.9 cm length. Normal morphology without mass or hydronephrosis.  Aorta:  Atherosclerotic plaque formation without aneurysmal dilatation.  Other:  No free fluid  IMPRESSION: Probable fatty infiltration of liver. Large hepatic cyst 5.7 cm diameter. Prominent CBD 8 mm diameter, potentially normal/physiologic post cholecystectomy, recommend correlation with LFTs. Atherosclerotic changes of the abdominal aorta.   Original Report Authenticated By: Ulyses Southward, M.D.       Medications: I have reviewed the patient's current medications.  Impression: 1. Acute pancreatitis, unclear etiology. Patient denies any alcohol use whatsoever. Her common bile duct is dilated, comment is that this may be postsurgical. 2. Hypertension. 3. Hypothyroidism. 4. Hypokalemia.     Plan: 1. Continue with n.p.o. and intravenous fluids. 2. Replete potassium. 3. Appreciate gastroenterology opinion/recommendations.     LOS: 1 day   Wilson Singer Pager [phone]  01/27/2012, 9:41 AM

## 2012-01-27 NOTE — ED Notes (Signed)
Dr. Orvan Falconer at bedside.  Report called to Med/Srg.  Pt to be admitted to room 306.

## 2012-01-27 NOTE — H&P (Signed)
[facility] History and Physical  [name]  ZOX:[id number]  DOB: 03-03-1931   DOA: 01/27/2012   PCP:   Karen Chafe,. nurse practitioner / Dr. Doristine Counter; [facility] [place]  Chief Complaint:  Nausea vomiting and diarrhea since yesterday  HPI: Kathleen [name] is an 78 y.o. female.   Occasional lady presents with her husband with the above complaints also associated with colic epigastric pain radiating through to her back; no aggravating or relieving factors, but because of greater than 10 stools per day and as well as greater than 10 episodes of vomiting per day she came to the emergency room for evaluation. She is unable to keep down any food or medication  He denies blood in vomitus or stool; she denies alcohol use she is status post cholecystectomy She is unsure of her medications but believes they do include HCTZ.  Review of records shows that in 2009 her medications included 1. Felodipine.  2. Hydrochlorothiazide.  3. Levothyroxine.  4. Metformin.  5. Potassium chloride.  6. Simvastatin.   She also now takes a Glaucoma medicine, medicine for Dry eyes (no preservatives, because she is allergic to this)   Rewiew of Systems:   All systems negative except as marked bold or noted in the HPI;  Constitutional: malaise, fever and chills. ;  Eyes: eye pain, redness and discharge. ;  ENMT: ear pain, hoarseness, nasal congestion, sinus pressure and sore throat. ;  Cardiovascular: chest pain, palpitations, diaphoresis, dyspnea and peripheral edema. ;  Respiratory: cough, hemoptysis, wheezing and stridor. ;  Gastrointestinal: nausea, vomiting, diarrhea, constipation, abdominal pain, melena, blood in stool, hematemesis, jaundice and rectal bleeding. unusual weight loss..  Genitourinary: frequency, dysuria, incontinence,flank pain and hematuria;  Musculoskeletal: back pain and neck pain. swelling and trauma.;  Skin: . pruritus, rash, abrasions, bruising and  skin lesion.; ulcerations  Neuro: headache, lightheadedness and neck stiffness. weakness, altered level of consciousness , altered mental status, extremity weakness, burning feet, involuntary movement, seizure and syncope.  Psych: anxiety, depression, insomnia, tearfulness, panic attacks, hallucinations, paranoia, suicidal or homicidal ideation     Past Medical History  Diagnosis Date  . Hypertension   . High cholesterol   . Diabetes mellitus without complication     "borderline"  . Hypothyroidism     Past Surgical History  Procedure Date  . Appendectomy   . Vein surgery   . Cholecystectomy   . Bladder tac   . Abdominal hysterectomy   . Excision of breast biopsy     Medications:  HOME MEDS: Prior to Admission medications   Not on File     Allergies:  No Known Allergies  Social History:   reports that she has never smoked. She does not have any smokeless tobacco history on file. She reports that she does not drink alcohol or use illicit drugs.  Family History: Family History  Problem Relation Age of Onset  . Diabetes Father   . Diabetes Mother   . Cancer Mother     ovarian  . Cancer Sister 110    breast  . Cancer Sister 60    bone     Physical Exam: Filed Vitals:   01/26/12 2229 01/27/12 0053 01/27/12 0144  BP: 131/57 118/47 141/43  Pulse: 88 79 83  Temp: 97.5 F (36.4 C) 98.8 F (37.1 C) 97.8 F (36.6 C)  TempSrc: Oral  Oral  Resp: 20 16   Height: 5\' 3"  (1.6 m)  5\' 3"  (1.6 m)  Weight: 77.111 kg (  170 lb)  78.2 kg (172 lb 6.4 oz)  SpO2: 99% 94% 99%   Blood pressure 141/43, pulse 83, temperature 97.8 F (36.6 C), temperature source Oral, resp. rate 16, height 5\' 3"  (1.6 m), weight 78.2 kg (172 lb 6.4 oz), SpO2 99.00%.  GEN:  Pleasant but ill-looking Caucasian lady lying in the stretcher; looks dehydrated and very uncomfortable;; cooperative with exam PSYCH:  alert and oriented x4;  affect is appropriate. HEENT: Mucous membranes pink dry and  anicteric; PERRLA; EOM intact; no cervical lymphadenopathy nor thyromegaly or carotid bruit; no JVD; Breasts:: Not examined CHEST WALL: No tenderness CHEST: Normal respiration, clear to auscultation bilaterally HEART: Regular rate and rhythm; no murmurs rubs or gallops BACK: No kyphosis or scoliosis; no CVA tenderness ABDOMEN: Obese, soft epigastric tenderness; no masses, no organomegaly,  tinkling abdominal bowel; no pannus; no intertriginous candida. Rectal Exam: Not done EXTREMITIES:  age-appropriate arthropathy of the hands and knees; no edema; no ulcerations. Genitalia: not examined PULSES: 2+ and symmetric SKIN: Normal hydration no rash or ulceration CNS: Cranial nerves 2-12 grossly intact no focal lateralizing neurologic deficit   Labs on Admission:  Basic Metabolic Panel:  Lab 01/26/12 1610  NA 137  K 3.4*  CL 100  CO2 26  GLUCOSE 207*  BUN 17  CREATININE 0.64  CALCIUM 9.0  MG --  PHOS --   Liver Function Tests:  Lab 01/26/12 2237  AST 103*  ALT 68*  ALKPHOS 77  BILITOT 1.2  PROT 7.1  ALBUMIN 3.9    Lab 01/26/12 2237  LIPASE 2119*  AMYLASE --   No results found for this basename: AMMONIA:5 in the last 168 hours CBC:  Lab 01/26/12 2237  WBC 6.6  NEUTROABS 6.1  HGB 14.1  HCT 41.9  MCV 89.1  PLT 135*   Cardiac Enzymes: No results found for this basename: CKTOTAL:5,CKMB:5,CKMBINDEX:5,TROPONINI:5 in the last 168 hours BNP: No components found with this basename: POCBNP:5 D-dimer: No components found with this basename: D-DIMER:5 CBG: No results found for this basename: GLUCAP:5 in the last 168 hours  Radiological Exams on Admission: No results found.   Assessment/Plan Present on Admission:  . acute Pancreatitis, unclear  . Hypertension . Hypothyroidism . Hyperlipidemia . Hypokalemia   PLAN: Will give this lady additional boluses, starting the patient on her potassium, and then admit to a MedSurg bed for continued IV hydration and pain  control. Will keep her n.p.o.  When necessary IV hydralazine for assistance with blood pressure control while n.p.o. Given low-dose intravenous Synthroid for hypothyroidism on too we have a definitive medication list  Because liver enzymes are elevated will still order an ultrasound to look at her biliary system and also her pancreas since this is relatively benign. She will likely need a CT scan of the abdomen eventually when she is better hydrated to rule out malignancy.  A GI consult placed  Other plans as per orders.  Code Status: FULL CODE, although she does not want to languish on life support  Family Communication: Husband present during interview and examination and plans discussed Disposition Plan: Depending on initial response to    Morgen Ritacco Nocturnist [facility] Pager [phone]   01/27/2012, 3:38 AM

## 2012-01-27 NOTE — Progress Notes (Signed)
UR Chart Review Completed  

## 2012-01-27 NOTE — Consult Note (Signed)
Referring Provider: Dr. Orvan Falconer ([facility]) Primary Care Physician:  Karen Chafe, NP at Dr Doristine Counter [facility]/[place]) Primary Gastroenterologist:  Dr. Julio Alm   Reason for Consultation:  Pancreatitis  HPI: Kathleen [name] is a 78 y.o. female admitted with acute pancreatitis.  She had some oatmeal, coffee & took her medicines yesterday.  Then she developed diarrhea, nausea & vomiting.  Now w/ severe pain in her upper abdomen.  Pain 10/10.  Now 0.  Pain radiates to back.  Lipase 2119, T bili 2.1 up from 1.2 yesterday.  AST 563 & ALT 413.  No hx etoh or illicit drug use.  Denies new medications.  She is s/p cholecystectomy years ago for cholelithiasis.  She has had an abdominal ultrasound that showed probable fatty infiltration of liver, large hepatic cyst 5.7 cm diameter & prominent CBD 8 mm diameter, potentially normal/physiologic post cholecystectomy, recommend correlation with LFTs. She has atherosclerotic changes of the abdominal aorta.  She took 1 ALEVE 2 nights ago for ankle edema.  Denies any other NSAIDs.  Denies fever or chills.  Denies jaundice.  She has mild thrombocytopenia.  EGD & colonoscopy years ago by Dr Arlyce Dice & more recently Dr Dickie La but pt cannot remember details & records unavailable.   Denies heartburn, indigestion, dysphagia, odynophagia or anorexia.   Denies constipation, rectal bleeding, melena or weight loss.  Past Medical History  Diagnosis Date  . Hypertension   . High cholesterol   . Diabetes mellitus without complication     "borderline"  . Hypothyroidism   . Diverticulosis of colon     Past Surgical History  Procedure Date  . Appendectomy   . Vein surgery   . Cholecystectomy 1999    cholelithiasis  . Bladder tac   . Abdominal hysterectomy     partial  . Excision of breast biopsy   . Colonoscopy     Dr Katheran James cannot remember  . Colonoscopy     Dr Arlyce Dice- diverticulosis, pt cannot remember dates  . Esophagogastroduodenoscopy      Dr Myles Lipps cannot remember    Prior to Admission medications   Not on File    Current Facility-Administered Medications  Medication Dose Route Frequency Provider Last Rate Last Dose  . 0.9 %  sodium chloride infusion   Intravenous STAT Nicoletta Dress. Colon Branch, MD 75 mL/hr at 01/27/12 0225    . 0.9 % NaCl with KCl 20 mEq/ L  infusion   Intravenous Continuous Wilson Singer, MD 150 mL/hr at 01/27/12 0450    . enoxaparin (LOVENOX) injection 40 mg  40 mg Subcutaneous Q24H Vania Rea, MD      . HYDROmorphone (DILAUDID) injection 0.5-1 mg  0.5-1 mg Intravenous Q2H PRN Vania Rea, MD   1 mg at 01/27/12 0433  . insulin aspart (novoLOG) injection 0-9 Units  0-9 Units Subcutaneous Q4H Vania Rea, MD   1 Units at 01/27/12 [phone]  . levothyroxine (SYNTHROID, LEVOTHROID) injection 12.5 mcg  12.5 mcg Intravenous Daily Vania Rea, MD      . ondansetron [facility]) injection 4 mg  4 mg Intravenous Q4H PRN Vania Rea, MD      . pantoprazole (PROTONIX) injection 40 mg  40 mg Intravenous Q24H Vania Rea, MD   40 mg at 01/27/12 0433    Allergies as of 01/26/2012  . (No Known Allergies)    Family History  Problem Relation Age of Onset  . Diabetes Father   . Diabetes Mother   . Cancer Mother  ovarian  . Cancer Sister 45    breast  . Cancer Sister 31    bone    History   Social History  . Marital Status: Married    Spouse Name: N/A    Number of Children: 1  . Years of Education: N/A   Occupational History  . retired, tobacco field    Social History Main Topics  . Smoking status: Never Smoker   . Smokeless tobacco: Not on file  . Alcohol Use: No  . Drug Use: No  . Sexually Active: Not on file   Other Topics Concern  . Not on file   Social History Narrative   Lost 1 son age 31-?MI1 living son    Review of Systems: Gen:see HPI CV: Denies chest pain, angina, palpitations, syncope, orthopnea, PND, peripheral edema, and claudication. Resp: Denies  dyspnea at rest, dyspnea with exercise, cough, sputum, wheezing, coughing up blood, and pleurisy. GI: Denies vomiting blood, jaundice, and fecal incontinence.   GU : Denies urinary burning, blood in urine, urinary frequency, urinary hesitancy, nocturnal urination, and urinary incontinence. MS: Denies joint pain, limitation of movement, and swelling, stiffness, low back pain, extremity pain. Denies muscle weakness, cramps, atrophy.  Derm: Denies rash, itching, dry skin, hives, moles, warts, or unhealing ulcers.  Psych: Denies depression, anxiety, memory loss, suicidal ideation, hallucinations, paranoia, and confusion. Heme: Denies bruising, bleeding, and enlarged lymph nodes. Neuro:  Denies any headaches, dizziness, paresthesias. Endo:  Denies any problems with DM or adrenal function.  Physical Exam: Vital signs in last 24 hours: Temp:  [97.5 F (36.4 C)-98.8 F (37.1 C)] 98.8 F (37.1 C) (12/17 0430) Pulse Rate:  [77-88] 77  (12/17 0430) Resp:  [16-20] 18  (12/17 0430) BP: (118-141)/(43-62) 136/62 mmHg (12/17 0430) SpO2:  [93 %-99 %] 93 % (12/17 0430) Weight:  [170 lb (77.111 kg)-172 lb 6.4 oz (78.2 kg)] 172 lb 6.4 oz (78.2 kg) (12/17 0144) Last BM Date: 01/26/12 No LMP recorded. Patient is postmenopausal. General:   Alert,  Well-developed, well-nourished, pleasant and cooperative in NAD Head:  Normocephalic and atraumatic. Eyes:  Sclera clear, no icterus.   Conjunctiva pink. Ears:  Normal auditory acuity. Nose:  No deformity, discharge, or lesions. Mouth:  No deformity or lesions,oropharynx pink & moist.  +subcentimeter left tongue dark lesion ?AVM. Neck:  Supple; no masses or thyromegaly. Lungs:  Clear throughout to auscultation.   No wheezes, crackles, or rhonchi. No acute distress. Heart:  Regular rate and rhythm; no murmurs, clicks, rubs,  or gallops. Abdomen:  Normal bowel sounds.  No bruits.  Small easily reducible non-tender umbilical hernia.  Soft, moderately distended  abdomen with generalized moderate tenderness. Unable to palpate masses, hepatosplenomegaly or hernias due to tenderness & body habitus.  +guarding.  No rebound tenderness.   Rectal:  Deferred. Msk:  Symmetrical without gross deformities.  Pulses:  Normal pulses noted. Extremities:  No edema. Neurologic:  Alert and oriented x4;  grossly normal neurologically. Skin:  Intact without significant lesions or rashes. Lymph Nodes:  No significant cervical adenopathy. Psych:  Alert and cooperative. Normal mood and affect.  Intake/Output from previous day: 12/16 0701 - 12/17 0700 In: -  Out: 750 [Urine:750] Intake/Output this shift:    Lab Results:  Basename 01/27/12 0449 01/26/12 2237  WBC 4.3 6.6  HGB 12.5 14.1  HCT 37.7 41.9  PLT 116* 135*   BMET  Basename 01/27/12 0449 01/26/12 2237  NA 137 137  K 3.1* 3.4*  CL 103 100  CO2  25 26  GLUCOSE 130* 207*  BUN 11 17  CREATININE 0.56 0.64  CALCIUM 8.3* 9.0   LFT  Basename 01/27/12 0449 01/26/12 2237  PROT 6.1 7.1  ALBUMIN 3.3* 3.9  AST 563* 103*  ALT 413* 68*  ALKPHOS 107 77  BILITOT 2.1*2.1* 1.2  BILIDIR 1.6* --  IBILI 0.5 --  LIPASE -- 2119*  AMYLASE -- --   Impression: Kathleen [name] is a pleasant 78 y.o. female with acute pancreatitis (lipase 2119), transaminitis,direct hyperbilirubinemia & CBD 8mm ON ULTRASOUND.  She is s/p cholecystectomy years ago for cholelithiasis.  She also has mild hypokalemia to be addressed per attending.  I discussed w/ Dr Darrick Penna.  Plan: 1. STAT CT abd with IV contrast  2. Aggressive IVFs discussed w/ Dr Karilyn Cota 3. Continue supportive measures, pain control, anti-emetics 4. K repletion per attending 5. NPO 6. PPI daily   LOS: 1 day   Lorenza Burton  01/27/2012, 9:46 AM [facility]

## 2012-01-27 NOTE — Progress Notes (Signed)
Patient states she is too weak to stand and refuses to have orthostatic BP done.

## 2012-01-27 NOTE — Care Management Note (Signed)
    Page 1 of 1   01/30/2012     10:57:07 AM   CARE MANAGEMENT NOTE 01/30/2012  Patient:  Kathleen [name], Kathleen [name]   Account Number:  [id number]  Date Initiated:  01/27/2012  Documentation initiated by:  Rosemary Holms  Subjective/Objective Assessment:   Pt admitted from home where she lives with spouse. C/o N, V, D. When medically clear, will return to home     Action/Plan:   Anticipated DC Date:  01/28/2012   Anticipated DC Plan:  HOME/SELF CARE      DC Planning Services  CM consult      Choice offered to / List presented to:          [place] arranged  HH-2 PT      [facility] agency  [facility]   Status of service:  Completed, signed off Medicare Important Message given?  YES (If response is "NO", the following Medicare IM given date fields will be blank) Date Medicare IM given:  01/30/2012 Date Additional Medicare IM given:    Discharge Disposition:  HOME W HOME HEALTH SERVICES  Per UR Regulation:    If discussed at Long Length of Stay Meetings, dates discussed:    Comments:  01/30/12 1055 Arlyss Queen, RN BSN CM Pt discharged home today with [facility] PT. [name] of [place] is aware and will collect the pts information from the chart. Pt has walker for home use. No other HH or DME needs noted. Pt and pts nurse aware of discharge arrangements.  01/27/12 Rosemary Holms RN BSN CM

## 2012-01-28 DIAGNOSIS — E785 Hyperlipidemia, unspecified: Secondary | ICD-10-CM

## 2012-01-28 DIAGNOSIS — K859 Acute pancreatitis without necrosis or infection, unspecified: Secondary | ICD-10-CM

## 2012-01-28 LAB — COMPREHENSIVE METABOLIC PANEL
ALT: 268 U/L — ABNORMAL HIGH (ref 0–35)
AST: 214 U/L — ABNORMAL HIGH (ref 0–37)
Albumin: 3 g/dL — ABNORMAL LOW (ref 3.5–5.2)
Alkaline Phosphatase: 114 U/L (ref 39–117)
Chloride: 105 mEq/L (ref 96–112)
Creatinine, Ser: 0.55 mg/dL (ref 0.50–1.10)
Potassium: 2.9 mEq/L — ABNORMAL LOW (ref 3.5–5.1)
Sodium: 138 mEq/L (ref 135–145)
Total Bilirubin: 0.8 mg/dL (ref 0.3–1.2)

## 2012-01-28 LAB — CBC
Platelets: 117 10*3/uL — ABNORMAL LOW (ref 150–400)
RBC: 3.94 MIL/uL (ref 3.87–5.11)
RDW: 13.5 % (ref 11.5–15.5)
WBC: 6.9 10*3/uL (ref 4.0–10.5)

## 2012-01-28 LAB — GLUCOSE, CAPILLARY
Glucose-Capillary: 65 mg/dL — ABNORMAL LOW (ref 70–99)
Glucose-Capillary: 82 mg/dL (ref 70–99)
Glucose-Capillary: 128 mg/dL — ABNORMAL HIGH (ref 70–99)

## 2012-01-28 MED ORDER — POTASSIUM CHLORIDE 10 MEQ/100ML IV SOLN
10.0000 meq | INTRAVENOUS | Status: AC
  Administered 2012-01-28 (×2): 10 meq via INTRAVENOUS
  Filled 2012-01-28 (×2): qty 100

## 2012-01-28 MED ORDER — LEVOTHYROXINE SODIUM 100 MCG PO TABS: 100.0000 ug | ORAL_TABLET | Freq: Every day | ORAL | Status: DC

## 2012-01-28 MED ORDER — POTASSIUM CHLORIDE IN NACL 40-0.9 MEQ/L-% IV SOLN
INTRAVENOUS | Status: DC
  Administered 2012-01-28 – 2012-01-29 (×2): via INTRAVENOUS

## 2012-01-28 MED ORDER — POTASSIUM CHLORIDE 10 MEQ/100ML IV SOLN
INTRAVENOUS | Status: AC
  Filled 2012-01-28: qty 100

## 2012-01-28 MED ORDER — LEVOTHYROXINE SODIUM 100 MCG PO TABS
100.0000 ug | ORAL_TABLET | Freq: Every day | ORAL | Status: DC
  Administered 2012-01-29 – 2012-01-30 (×2): 100 ug via ORAL
  Filled 2012-01-28 (×2): qty 1

## 2012-01-28 NOTE — Progress Notes (Signed)
Chart reviewed.  Subjective: No pain. No nausea vomiting or diarrhea. Feels weak.  Objective: Vital signs in last 24 hours: Filed Vitals:   01/27/12 1352 01/27/12 2032 01/28/12 0449 01/28/12 0952  BP: 91/54 114/67 128/71 125/70  Pulse: 71 67 88 84  Temp: 99 F (37.2 C) 98.5 F (36.9 C) 98.8 F (37.1 C) 98.4 F (36.9 C)  TempSrc:  Oral Oral Oral  Resp: 16 18 18 18   Height:      Weight:      SpO2: 91% 90% 90% 91%   Weight change:   Intake/Output Summary (Last 24 hours) at 01/28/12 1027 Last data filed at 01/28/12 0700  Gross per 24 hour  Intake 3043.75 ml  Output   2650 ml  Net 393.75 ml   General: Weak appearing. Alert and oriented. Lungs clear to auscultation bilaterally without wheezes rhonchi or rales Cardiovascular regular rate rhythm without murmurs gallops rubs Abdomen soft nontender nondistended Extremities no clubbing cyanosis or edema  Lab Results: Basic Metabolic Panel:  Lab 01/28/12 4098 01/27/12 1728 01/27/12 0449  NA 138 137 --  K 2.9* 2.9* --  CL 105 105 --  CO2 22 25 --  GLUCOSE 73 99 --  BUN 5* 8 --  CREATININE 0.55 0.64 --  CALCIUM 8.1* 8.0* --  MG -- -- 1.6  PHOS -- -- --   Liver Function Tests:  Lab 01/28/12 0445 01/27/12 1728  AST 214* 322*  ALT 268* 329*  ALKPHOS 114 115  BILITOT 0.8 2.1*  PROT 5.7* 5.5*  ALBUMIN 3.0* 2.9*    Lab 01/28/12 0445 01/26/12 2237  LIPASE 58 2119*  AMYLASE -- --   No results found for this basename: AMMONIA:2 in the last 168 hours CBC:  Lab 01/28/12 0445 01/27/12 0449 01/26/12 2237  WBC 6.9 4.3 --  NEUTROABS -- -- 6.1  HGB 11.6* 12.5 --  HCT 35.2* 37.7 --  MCV 89.3 88.5 --  PLT 117* 116* --   Cardiac Enzymes: No results found for this basename: CKTOTAL:3,CKMB:3,CKMBINDEX:3,TROPONINI:3 in the last 168 hours BNP: No results found for this basename: PROBNP:3 in the last 168 hours D-Dimer: No results found for this basename: DDIMER:2 in the last 168 hours CBG:  Lab 01/28/12 0735 01/28/12  0350 01/27/12 2346 01/27/12 2029 01/27/12 1655 01/27/12 1123  GLUCAP 65* 78 79 79 99 109*   Hemoglobin A1C:  Lab 01/27/12 0449  HGBA1C 6.3*   Fasting Lipid Panel:  Lab 01/27/12 0021  CHOL 130  HDL 49  LDLCALC 65  TRIG 81  CHOLHDL 2.7  LDLDIRECT --   Thyroid Function Tests:  Lab 01/27/12 0449  TSH 0.577  T4TOTAL --  FREET4 --  T3FREE --  THYROIDAB --   Coagulation: No results found for this basename: LABPROT:4,INR:4 in the last 168 hours Anemia Panel: No results found for this basename: VITAMINB12,FOLATE,FERRITIN,TIBC,IRON,RETICCTPCT in the last 168 hours Urine Drug Screen: Drugs of Abuse  No results found for this basename: labopia, cocainscrnur, labbenz, amphetmu, thcu, labbarb    Alcohol Level: No results found for this basename: ETH:2 in the last 168 hours Urinalysis:  Lab 01/27/12 0454  COLORURINE YELLOW  LABSPEC 1.020  PHURINE 6.0  GLUCOSEU NEGATIVE  HGBUR TRACE*  BILIRUBINUR NEGATIVE  KETONESUR NEGATIVE  PROTEINUR NEGATIVE  UROBILINOGEN 2.0*  NITRITE NEGATIVE  LEUKOCYTESUR NEGATIVE   Micro Results: No results found for this or any previous visit (from the past 240 hour(s)). Studies/Results: Ct Abdomen W Contrast  01/27/2012  *RADIOLOGY REPORT*  Clinical Data: Epigastric pain  radiating into the back with nausea, vomiting and diarrhea for 2 days.  History of hypertension, diabetes, cholecystectomy, appendectomy and hysterectomy.  CT ABDOMEN WITH CONTRAST  Technique:  Multidetector CT imaging of the abdomen was performed following the standard protocol during bolus administration of intravenous contrast.  Contrast: OMNIPAQUE IOHEXOL 300 MG/ML  SOLN  Comparison: Abdominal ultrasound 01/27/2012.  Abdominal pelvic CT 10/14/2007.  Findings: There are small bilateral pleural effusions.  There is mild dependent atelectasis in both lower lobes. Coronary artery calcifications are noted. An asymmetric density laterally in the right breast on image 14 is  unchanged.  A central hepatic cyst has not significantly changed from the prior CT, measuring up to 4.8 cm on image 29.  The gallbladder is surgically absent. Mild intra and extrahepatic biliary dilatation has developed compared with the prior CT.  The common bile duct measures up to 9 mm in diameter and appears dilated to the ampulla. No pancreatic mass, pancreatic ductal dilatation or ampullary lesion is identified.  There is no surrounding inflammatory change. No other hepatic abnormalities are seen.  The spleen, adrenal glands and kidneys appear normal.  The stomach is incompletely distended.  No small bowel or colonic abnormalities are seen within the abdomen.  There is mild aorto iliac atherosclerosis and lumbar spondylosis.  The pelvis was not imaged.  IMPRESSION:  1.  Progressive intra and extrahepatic biliary dilatation status post cholecystectomy.  A specific etiology is not demonstrated by this examination.  Distal biliary stricture, small ampullary lesion and choledocholithiasis cannot be excluded. 2.  Small bilateral pleural effusions with associated bibasilar atelectasis. 3.  Stable central hepatic cyst.   Original Report Authenticated By: Carey Bullocks, M.D.    US Abdomen Complete  01/27/2012  *RADIOLOGY REPORT*  Clinical Data:  Acute pancreatitis, elevated LFTs; past history hypertension, diabetes, hypercholesterolemia, cholecystectomy, appendectomy  ULTRASOUND ABDOMEN:  Technique:  Sonography of upper abdominal structures was performed.  Comparison:  None  Gallbladder:  Surgically absent  Common bile duct:  8 mm diameter, which may be normal post cholecystectomy  Liver:  Echogenic, likely fatty infiltration, though this can be seen with cirrhosis and certain infiltrative disorders.  Cyst right lobe 5.7 x 4.4 x 5.0 cm.  Hepatopetal portal venous flow.  No definite solid mass lesion.  IVC:  Normal appearance  Pancreas:  Heterogeneous echogenicity without mass  Spleen:  Normal morphology, 8.5 cm  length.  Right kidney:  11.2 cm length. Normal morphology without mass or hydronephrosis.  Left kidney:  10.9 cm length. Normal morphology without mass or hydronephrosis.  Aorta:  Atherosclerotic plaque formation without aneurysmal dilatation.  Other:  No free fluid  IMPRESSION: Probable fatty infiltration of liver. Large hepatic cyst 5.7 cm diameter. Prominent CBD 8 mm diameter, potentially normal/physiologic post cholecystectomy, recommend correlation with LFTs. Atherosclerotic changes of the abdominal aorta.   Original Report Authenticated By: Ulyses Southward, M.D.    Scheduled Meds:   . insulin aspart  0-9 Units Subcutaneous Q4H  . levothyroxine  12.5 mcg Intravenous Daily  . pantoprazole (PROTONIX) IV  40 mg Intravenous QAC breakfast   Continuous Infusions:   . 0.9 % NaCl with KCl 20 mEq / L 125 mL/hr at 01/28/12 0236   PRN Meds:.HYDROmorphone (DILAUDID) injection, ondansetron (ZOFRAN) IV Assessment/Plan: Principal Problem:  *Pancreatitis: Etiology not clear. Improving. Start clears. Outpatient ERCP. Active Problems:  Hypertension  Hypothyroidism  Hyperlipidemia, statin held  Hypokalemia replete IV. Increase activity   LOS: 2 days   Kyre Jeffries L 01/28/2012, 10:27 AM

## 2012-01-28 NOTE — Progress Notes (Signed)
Subjective: Denies N/V, abdominal pain. No complaints. CT yesterday with progressive intra and extrahepatic biliary dilatation status post cholecystectomy. Films reviewed by Dr. Darrick Penna with Dr. Tyron Russell as noted yesterday. No definite CBD identified, mild fullness at HOP.  Objective: Vital signs in last 24 hours: Temp:  [98.5 F (36.9 C)-99 F (37.2 C)] 98.8 F (37.1 C) (12/18 0449) Pulse Rate:  [67-88] 88  (12/18 0449) Resp:  [16-18] 18  (12/18 0449) BP: (91-128)/(54-71) 128/71 mmHg (12/18 0449) SpO2:  [90 %-91 %] 90 % (12/18 0449) Last BM Date: 01/26/12 General:   Alert and oriented, pleasant Head:  Normocephalic and atraumatic. Eyes:  No icterus, sclera clear. Conjuctiva pink.  Heart:  S1, S2 present, no murmurs noted.  Lungs: Clear to auscultation bilaterally, without wheezing, rales, or rhonchi.  Abdomen:  Bowel sounds present, soft, non-tender, non-distended. Small umbilical hernia noted.  Msk:  Symmetrical without gross deformities. Normal posture. Extremities:  Without clubbing or edema. Neurologic:  Alert and  oriented x4;  grossly normal neurologically. Skin:  Warm and dry, intact without significant lesions. Marland Kitchen Psych:  Alert and cooperative. Normal mood and affect.  Intake/Output from previous day: 12/17 0701 - 12/18 0700 In: 3043.8 [I.V.:3043.8] Out: 2650 [Urine:2650] Intake/Output this shift: Total I/O In: 1381.3 [I.V.:1381.3] Out: 2000 [Urine:2000]  Lab Results:  [facility] 01/28/12 0445 01/27/12 0449 01/26/12 2237  WBC 6.9 4.3 6.6  HGB 11.6* 12.5 14.1  HCT 35.2* 37.7 41.9  PLT 117* 116* 135*   BMET  Basename 01/28/12 0445 01/27/12 1728 01/27/12 0449  NA 138 137 137  K 2.9* 2.9* 3.1*  CL 105 105 103  CO2 22 25 25   GLUCOSE 73 99 130*  BUN 5* 8 11  CREATININE 0.55 0.64 0.56  CALCIUM 8.1* 8.0* 8.3*   LFT  Basename 01/28/12 0445 01/27/12 1728 01/27/12 0449  PROT 5.7* 5.5* 6.1  ALBUMIN 3.0* 2.9* 3.3*  AST 214* 322* 563*  ALT 268* 329* 413*  ALKPHOS 114  115 107  BILITOT 0.8 2.1* 2.1*2.1*  BILIDIR -- -- 1.6*  IBILI -- -- 0.5   LIPASE: 58  Studies/Results: Ct Abdomen W Contrast  01/27/2012  *RADIOLOGY REPORT*  Clinical Data: Epigastric pain radiating into the back with nausea, vomiting and diarrhea for 2 days.  History of hypertension, diabetes, cholecystectomy, appendectomy and hysterectomy.  CT ABDOMEN WITH CONTRAST  Technique:  Multidetector CT imaging of the abdomen was performed following the standard protocol during bolus administration of intravenous contrast.  Contrast: OMNIPAQUE IOHEXOL 300 MG/ML  SOLN  Comparison: Abdominal ultrasound 01/27/2012.  Abdominal pelvic CT 10/14/2007.  Findings: There are small bilateral pleural effusions.  There is mild dependent atelectasis in both lower lobes. Coronary artery calcifications are noted. An asymmetric density laterally in the right breast on image 14 is unchanged.  A central hepatic cyst has not significantly changed from the prior CT, measuring up to 4.8 cm on image 29.  The gallbladder is surgically absent. Mild intra and extrahepatic biliary dilatation has developed compared with the prior CT.  The common bile duct measures up to 9 mm in diameter and appears dilated to the ampulla. No pancreatic mass, pancreatic ductal dilatation or ampullary lesion is identified.  There is no surrounding inflammatory change. No other hepatic abnormalities are seen.  The spleen, adrenal glands and kidneys appear normal.  The stomach is incompletely distended.  No small bowel or colonic abnormalities are seen within the abdomen.  There is mild aorto iliac atherosclerosis and lumbar spondylosis.  The pelvis was not  imaged.  IMPRESSION:  1.  Progressive intra and extrahepatic biliary dilatation status post cholecystectomy.  A specific etiology is not demonstrated by this examination.  Distal biliary stricture, small ampullary lesion and choledocholithiasis cannot be excluded. 2.  Small bilateral pleural effusions  with associated bibasilar atelectasis. 3.  Stable central hepatic cyst.   Original Report Authenticated By: Carey Bullocks, M.D.    US Abdomen Complete  01/27/2012  *RADIOLOGY REPORT*  Clinical Data:  Acute pancreatitis, elevated LFTs; past history hypertension, diabetes, hypercholesterolemia, cholecystectomy, appendectomy  ULTRASOUND ABDOMEN:  Technique:  Sonography of upper abdominal structures was performed.  Comparison:  None  Gallbladder:  Surgically absent  Common bile duct:  8 mm diameter, which may be normal post cholecystectomy  Liver:  Echogenic, likely fatty infiltration, though this can be seen with cirrhosis and certain infiltrative disorders.  Cyst right lobe 5.7 x 4.4 x 5.0 cm.  Hepatopetal portal venous flow.  No definite solid mass lesion.  IVC:  Normal appearance  Pancreas:  Heterogeneous echogenicity without mass  Spleen:  Normal morphology, 8.5 cm length.  Right kidney:  11.2 cm length. Normal morphology without mass or hydronephrosis.  Left kidney:  10.9 cm length. Normal morphology without mass or hydronephrosis.  Aorta:  Atherosclerotic plaque formation without aneurysmal dilatation.  Other:  No free fluid  IMPRESSION: Probable fatty infiltration of liver. Large hepatic cyst 5.7 cm diameter. Prominent CBD 8 mm diameter, potentially normal/physiologic post cholecystectomy, recommend correlation with LFTs. Atherosclerotic changes of the abdominal aorta.   Original Report Authenticated By: Ulyses Southward, M.D.     Assessment: 78 year old female admitted with acute pancreatitis and admitting lipase 2119, transaminitis with a direct hyperbilirubinemia. CT without specific evidence for CBD stones, but there was  progressive intra and extrahepatic biliary dilatation status post cholecystectomy. Currently, lipase has normalized to 58, Tbili normalized, LFTs improving overall. Pt clinically improved without abdominal pain, N/V. Remains NPO. Would consider clear liquids today, hold off on inpatient  ERCP currently. Will need ERCP as outpatient to further assess.  As separate note, thrombocytopenia noted, worsening while on Lovenox. Lovenox has been held and SCDs placed.   Plan: Consider advancing to clears ERCP as outpatient Monitor for any development of fever, Unasyn if febrile Repeat LFTs in am Potassium repletion per hospitalist Hopeful d/c in near future if continues to clinically improve and LFTs continue to trend down   LOS: 2 days   Gerrit Halls  01/28/2012, 6:56 AM  Patient seen and examined this afternoon. Patient states abdominal pain pretty much resolving. Her abdomen is soft with minimal epigastric tenderness. If she continues to progress overnight, would consider dancing her to a low-fat diet tomorrow morning.  Further imaging of her pancreatico-biliary tree as an outpatient per Dr. Darrick Penna

## 2012-01-29 DIAGNOSIS — R5381 Other malaise: Secondary | ICD-10-CM | POA: Diagnosis present

## 2012-01-29 DIAGNOSIS — R7989 Other specified abnormal findings of blood chemistry: Secondary | ICD-10-CM

## 2012-01-29 LAB — COMPREHENSIVE METABOLIC PANEL WITH GFR
ALT: 176 U/L — ABNORMAL HIGH (ref 0–35)
AST: 83 U/L — ABNORMAL HIGH (ref 0–37)
Albumin: 3 g/dL — ABNORMAL LOW (ref 3.5–5.2)
Alkaline Phosphatase: 105 U/L (ref 39–117)
BUN: 3 mg/dL — ABNORMAL LOW (ref 6–23)
CO2: 23 meq/L (ref 19–32)
Calcium: 8.4 mg/dL (ref 8.4–10.5)
Chloride: 110 meq/L (ref 96–112)
Creatinine, Ser: 0.5 mg/dL (ref 0.50–1.10)
GFR calc Af Amer: >90 mL/min
GFR calc non Af Amer: >90 mL/min
Glucose, Bld: 165 mg/dL — ABNORMAL HIGH (ref 70–99)
Potassium: 3.4 meq/L — ABNORMAL LOW (ref 3.5–5.1)
Sodium: 142 meq/L (ref 135–145)
Total Bilirubin: 0.8 mg/dL (ref 0.3–1.2)
Total Protein: 6.2 g/dL (ref 6.0–8.3)

## 2012-01-29 LAB — GLUCOSE, CAPILLARY
Glucose-Capillary: 111 mg/dL — ABNORMAL HIGH (ref 70–99)
Glucose-Capillary: 128 mg/dL — ABNORMAL HIGH (ref 70–99)
Glucose-Capillary: 133 mg/dL — ABNORMAL HIGH (ref 70–99)
Glucose-Capillary: 164 mg/dL — ABNORMAL HIGH (ref 70–99)
Glucose-Capillary: 168 mg/dL — ABNORMAL HIGH (ref 70–99)
Glucose-Capillary: 176 mg/dL — ABNORMAL HIGH (ref 70–99)

## 2012-01-29 MED ORDER — AMLODIPINE BESYLATE 5 MG PO TABS
5.0000 mg | ORAL_TABLET | Freq: Every day | ORAL | Status: DC
  Administered 2012-01-29 – 2012-01-30 (×2): 5 mg via ORAL
  Filled 2012-01-29 (×2): qty 1

## 2012-01-29 MED ORDER — DORZOLAMIDE HCL-TIMOLOL MAL 2-0.5 % OP SOLN
1.0000 [drp] | Freq: Two times a day (BID) | OPHTHALMIC | Status: DC
  Administered 2012-01-29: 1 [drp] via OPHTHALMIC
  Filled 2012-01-29: qty 10

## 2012-01-29 MED ORDER — TAFLUPROST 0.0015 % OP SOLN: 1.0000 [drp] | Freq: Every day | OPHTHALMIC | Status: DC

## 2012-01-29 MED ORDER — LISINOPRIL 5 MG PO TABS
2.5000 mg | ORAL_TABLET | Freq: Every day | ORAL | Status: DC
  Administered 2012-01-29 – 2012-01-30 (×2): 2.5 mg via ORAL
  Filled 2012-01-29 (×2): qty 1

## 2012-01-29 MED ORDER — ASPIRIN EC 81 MG PO TBEC
81.0000 mg | DELAYED_RELEASE_TABLET | Freq: Every day | ORAL | Status: DC
  Administered 2012-01-29 – 2012-01-30 (×2): 81 mg via ORAL
  Filled 2012-01-29 (×2): qty 1

## 2012-01-29 MED ORDER — POTASSIUM CHLORIDE CRYS ER 20 MEQ PO TBCR
30.0000 meq | EXTENDED_RELEASE_TABLET | Freq: Every day | ORAL | Status: DC
  Administered 2012-01-29 – 2012-01-30 (×2): 30 meq via ORAL
  Filled 2012-01-29 (×2): qty 1

## 2012-01-29 NOTE — Evaluation (Signed)
Physical Therapy Evaluation Patient Details Name: Kathleen [name] MRN: [id number] DOB: 1931/12/22 Today's Date: 01/29/2012 Time: 4098-1191 PT Time Calculation (min): 38 min  PT Assessment / Plan / Recommendation Clinical Impression  Pt was found to be deconditioned upon eval, but is functionallly ambualtory with a walker.  Recommend HHPT at d/c.    PT Assessment  Patient needs continued PT services    Follow Up Recommendations  Home health PT    Does the patient have the potential to tolerate intense rehabilitation      Barriers to Discharge None      Equipment Recommendations  None recommended by PT    Recommendations for Other Services     Frequency Min 3X/week    Precautions / Restrictions Precautions Precautions: Fall Restrictions Weight Bearing Restrictions: No   Pertinent Vitals/Pain       Mobility  Bed Mobility Bed Mobility: Supine to Sit;Sit to Supine Supine to Sit: 4: Min guard;HOB elevated Sit to Supine: Not Tested (comment) Transfers Transfers: Sit to Stand;Stand to Sit Sit to Stand: 6: Modified independent (Device/Increase time);With upper extremity assist;From bed Stand to Sit: 6: Modified independent (Device/Increase time);With upper extremity assist;To chair/3-in-1 Ambulation/Gait Ambulation/Gait Assistance: 5: Supervision Ambulation Distance (Feet): 160 Feet Assistive device: Rolling walker Gait Pattern: Within Functional Limits Gait velocity: WNL Stairs: No Wheelchair Mobility Wheelchair Mobility: No    Shoulder Instructions     Exercises General Exercises - Lower Extremity Ankle Circles/Pumps: Strengthening;Both;5 reps;Supine Heel Slides: Strengthening;Both;5 reps;Supine Hip ABduction/ADduction: Strengthening;Both;5 reps;Supine   PT Diagnosis: Difficulty walking;Generalized weakness  PT Problem List: Decreased strength;Decreased activity tolerance;Decreased mobility;Decreased knowledge of use of DME PT Treatment Interventions: DME  instruction;Gait training;Stair training;Therapeutic exercise   PT Goals Acute Rehab PT Goals PT Goal Formulation: With patient/family Time For Goal Achievement: 02/05/12 Potential to Achieve Goals: Good Pt will Ambulate: >150 feet;with modified independence;with least restrictive assistive device PT Goal: Ambulate - Progress: Goal set today Pt will Go Up / Down Stairs: 1-2 stairs;with supervision;with rail(s) PT Goal: Up/Down Stairs - Progress: Goal set today  Visit Information  Last PT Received On: 01/29/12    Subjective Data  Subjective: I feel very weak Patient Stated Goal: return home   Prior Functioning  Home Living Lives With: Spouse Available Help at Discharge: Family;Available 24 hours/day Type of Home: House Home Access: Stairs to enter [employer] of Steps: 2 Entrance Stairs-Rails: Right Home Layout: One level Bathroom Shower/Tub: Engineer, manufacturing systems: Standard Home Adaptive Equipment: Walker - rolling;Bedside commode/3-in-1;Straight cane;Shower chair without back Prior Function Level of Independence: Independent Able to Take Stairs?: Yes Driving: Yes Vocation: Retired Musician: No difficulties    Cognition  Overall Cognitive Status: Appears within functional limits for tasks assessed/performed Arousal/Alertness: Awake/alert Orientation Level: Appears intact for tasks assessed Behavior During Session: [facility] for tasks performed    Extremity/Trunk Assessment Right Lower Extremity Assessment RLE ROM/Strength/Tone: WFL for tasks assessed RLE Sensation: WFL - Light Touch RLE Coordination: WFL - gross motor Left Lower Extremity Assessment LLE ROM/Strength/Tone: WFL for tasks assessed LLE Sensation: WFL - Light Touch LLE Coordination: WFL - gross motor Trunk Assessment Trunk Assessment: Kyphotic   Balance    End of Session PT - End of Session Equipment Utilized During Treatment: Gait belt Activity Tolerance: Patient  tolerated treatment well Patient left: in chair;with call bell/phone within reach;with family/visitor present  GP     Myrlene Broker L 01/29/2012, 4:07 PM

## 2012-01-29 NOTE — Progress Notes (Signed)
Ambulated from the bed to the bedroom doorway. Assist x2. Pt has unsteady gait. Assisted back to bed.

## 2012-01-29 NOTE — Progress Notes (Addendum)
Subjective: No pain. Tolerating clear liquids. Had a few loose stools yesterday. Felt dizzy and weak when she stood up this morning.  Objective: Vital signs in last 24 hours: Filed Vitals:   01/29/12 0243 01/29/12 0419 01/29/12 0423 01/29/12 0426  BP: 143/61 149/65 172/80 174/80  Pulse: 79 84 95 99  Temp: 98.1 F (36.7 C) 98 F (36.7 C)    TempSrc: Oral Oral    Resp: 18 18    Height:      Weight:    81.647 kg (180 lb)  SpO2: 97% 96% 95% 93%   Weight change:   Intake/Output Summary (Last 24 hours) at 01/29/12 0925 Last data filed at 01/29/12 0840  Gross per 24 hour  Intake   1785 ml  Output   5400 ml  Net  -3615 ml    Lungs clear to auscultation bilaterally without wheezes rhonchi or rales Cardiovascular regular rate rhythm without murmurs gallops rubs Abdomen soft nontender nondistended Extremities no clubbing cyanosis or edema Psychiatric Affect flat Lab Results: Basic Metabolic Panel:  Lab 01/29/12 0981 01/28/12 0445 01/27/12 0449  NA 142 138 --  K 3.4* 2.9* --  CL 110 105 --  CO2 23 22 --  GLUCOSE 165* 73 --  BUN 3* 5* --  CREATININE 0.50 0.55 --  CALCIUM 8.4 8.1* --  MG -- -- 1.6  PHOS -- -- --   Liver Function Tests:  Lab 01/29/12 0444 01/28/12 0445  AST 83* 214*  ALT 176* 268*  ALKPHOS 105 114  BILITOT 0.8 0.8  PROT 6.2 5.7*  ALBUMIN 3.0* 3.0*    Lab 01/28/12 0445 01/26/12 2237  LIPASE 58 2119*  AMYLASE -- --   No results found for this basename: AMMONIA:2 in the last 168 hours CBC:  Lab 01/28/12 0445 01/27/12 0449 01/26/12 2237  WBC 6.9 4.3 --  NEUTROABS -- -- 6.1  HGB 11.6* 12.5 --  HCT 35.2* 37.7 --  MCV 89.3 88.5 --  PLT 117* 116* --   Cardiac Enzymes: No results found for this basename: CKTOTAL:3,CKMB:3,CKMBINDEX:3,TROPONINI:3 in the last 168 hours BNP: No results found for this basename: PROBNP:3 in the last 168 hours D-Dimer: No results found for this basename: DDIMER:2 in the last 168 hours CBG:  Lab 01/29/12 0727  01/29/12 0430 01/29/12 0133 01/28/12 2039 01/28/12 1657 01/28/12 1148  GLUCAP 128* 148* 111* 128* 110* 134*   Hemoglobin A1C:  Lab 01/27/12 0449  HGBA1C 6.3*   Fasting Lipid Panel:  Lab 01/27/12 0021  CHOL 130  HDL 49  LDLCALC 65  TRIG 81  CHOLHDL 2.7  LDLDIRECT --   Thyroid Function Tests:  Lab 01/27/12 0449  TSH 0.577  T4TOTAL --  FREET4 --  T3FREE --  THYROIDAB --   Coagulation: No results found for this basename: LABPROT:4,INR:4 in the last 168 hours Anemia Panel: No results found for this basename: VITAMINB12,FOLATE,FERRITIN,TIBC,IRON,RETICCTPCT in the last 168 hours Urine Drug Screen: Drugs of Abuse  No results found for this basename: labopia,  cocainscrnur,  labbenz,  amphetmu,  thcu,  labbarb    Alcohol Level: No results found for this basename: ETH:2 in the last 168 hours Urinalysis:  Lab 01/27/12 0454  COLORURINE YELLOW  LABSPEC 1.020  PHURINE 6.0  GLUCOSEU NEGATIVE  HGBUR TRACE*  BILIRUBINUR NEGATIVE  KETONESUR NEGATIVE  PROTEINUR NEGATIVE  UROBILINOGEN 2.0*  NITRITE NEGATIVE  LEUKOCYTESUR NEGATIVE   Micro Results: No results found for this or any previous visit (from the past 240 hour(s)). Studies/Results: Ct Abdomen  W Contrast  01/27/2012  *RADIOLOGY REPORT*  Clinical Data: Epigastric pain radiating into the back with nausea, vomiting and diarrhea for 2 days.  History of hypertension, diabetes, cholecystectomy, appendectomy and hysterectomy.  CT ABDOMEN WITH CONTRAST  Technique:  Multidetector CT imaging of the abdomen was performed following the standard protocol during bolus administration of intravenous contrast.  Contrast: OMNIPAQUE IOHEXOL 300 MG/ML  SOLN  Comparison: Abdominal ultrasound 01/27/2012.  Abdominal pelvic CT 10/14/2007.  Findings: There are small bilateral pleural effusions.  There is mild dependent atelectasis in both lower lobes. Coronary artery calcifications are noted. An asymmetric density laterally in the right  breast on image 14 is unchanged.  A central hepatic cyst has not significantly changed from the prior CT, measuring up to 4.8 cm on image 29.  The gallbladder is surgically absent. Mild intra and extrahepatic biliary dilatation has developed compared with the prior CT.  The common bile duct measures up to 9 mm in diameter and appears dilated to the ampulla. No pancreatic mass, pancreatic ductal dilatation or ampullary lesion is identified.  There is no surrounding inflammatory change. No other hepatic abnormalities are seen.  The spleen, adrenal glands and kidneys appear normal.  The stomach is incompletely distended.  No small bowel or colonic abnormalities are seen within the abdomen.  There is mild aorto iliac atherosclerosis and lumbar spondylosis.  The pelvis was not imaged.  IMPRESSION:  1.  Progressive intra and extrahepatic biliary dilatation status post cholecystectomy.  A specific etiology is not demonstrated by this examination.  Distal biliary stricture, small ampullary lesion and choledocholithiasis cannot be excluded. 2.  Small bilateral pleural effusions with associated bibasilar atelectasis. 3.  Stable central hepatic cyst.   Original Report Authenticated By: Carey Bullocks, M.D.    Scheduled Meds:    . insulin aspart  0-9 Units Subcutaneous Q4H  . levothyroxine  100 mcg Oral QAC breakfast   Continuous Infusions:    . 0.9 % NaCl with KCl 40 mEq / L 100 mL/hr at 01/29/12 0919   PRN Meds:.HYDROmorphone (DILAUDID) injection, ondansetron (ZOFRAN) IV Assessment/Plan: Principal Problem:  *Pancreatitis Active Problems:  Hypertension  Hypokalemia  Elevated LFTs  Hypothyroidism  Hyperlipidemia  Physical deconditioning  Improving. Agree with advancing diet. Will stop IV fluids. Physical therapy consult for weakness. Hypokalemia improving. Liver function tests improving. Anticipate discharge tomorrow if patient continues to improve. Resume antihypertensives. Eyedrops.   LOS: 3 days    Julian Askin L 01/29/2012, 9:25 AM

## 2012-01-29 NOTE — Progress Notes (Signed)
UR Chart Review Completed  

## 2012-01-29 NOTE — Progress Notes (Addendum)
Subjective: Denies abdominal pain, no N/V, states loose stool yesterday and last night, 2 total. Tolerating clear liquids.   Objective: Vital signs in last 24 hours: Temp:  [97.8 F (36.6 C)-98.6 F (37 C)] 98 F (36.7 C) (12/19 0419) Pulse Rate:  [74-99] 99  (12/19 0426) Resp:  [18] 18  (12/19 0419) BP: (122-174)/(50-80) 174/80 mmHg (12/19 0426) SpO2:  [91 %-97 %] 93 % (12/19 0426) Weight:  [180 lb (81.647 kg)] 180 lb (81.647 kg) (12/19 0426) Last BM Date: 01/26/12 General:   Alert and oriented, pleasant Head:  Normocephalic and atraumatic. Eyes:  No icterus, sclera clear. Conjuctiva pink.  Heart:  S1, S2 present, no murmurs noted.  Lungs: Clear to auscultation bilaterally, without wheezing, rales, or rhonchi.  Abdomen:  Bowel sounds present, soft, non-tender, non-distended. Possible small umbilical hernia. Extremities:  Without clubbing or edema. Neurologic:  Alert and  oriented x4;  grossly normal neurologically. Skin:  Warm and dry, intact without significant lesions.  Psych:  Alert and cooperative. Normal mood and affect.  Intake/Output from previous day: 12/18 0701 - 12/19 0700 In: 1160 [P.O.:1160] Out: 5400 [Urine:5400] Intake/Output this shift:    Lab Results:  Basename 01/28/12 0445 01/27/12 0449 01/26/12 2237  WBC 6.9 4.3 6.6  HGB 11.6* 12.5 14.1  HCT 35.2* 37.7 41.9  PLT 117* 116* 135*   BMET  Basename 01/29/12 0444 01/28/12 0445 01/27/12 1728  NA 142 138 137  K 3.4* 2.9* 2.9*  CL 110 105 105  CO2 23 22 25   GLUCOSE 165* 73 99  BUN 3* 5* 8  CREATININE 0.50 0.55 0.64  CALCIUM 8.4 8.1* 8.0*   LFT  Basename 01/29/12 0444 01/28/12 0445 01/27/12 1728 01/27/12 0449  PROT 6.2 5.7* 5.5* --  ALBUMIN 3.0* 3.0* 2.9* --  AST 83* 214* 322* --  ALT 176* 268* 329* --  ALKPHOS 105 114 115 --  BILITOT 0.8 0.8 2.1* --  BILIDIR -- -- -- 1.6*  IBILI -- -- -- 0.5   LIPASE: normal 12/18 Admitting lipase 2119   Studies/Results: Ct Abdomen W  Contrast  01/27/2012  *RADIOLOGY REPORT*  Clinical Data: Epigastric pain radiating into the back with nausea, vomiting and diarrhea for 2 days.  History of hypertension, diabetes, cholecystectomy, appendectomy and hysterectomy.  CT ABDOMEN WITH CONTRAST  Technique:  Multidetector CT imaging of the abdomen was performed following the standard protocol during bolus administration of intravenous contrast.  Contrast: OMNIPAQUE IOHEXOL 300 MG/ML  SOLN  Comparison: Abdominal ultrasound 01/27/2012.  Abdominal pelvic CT 10/14/2007.  Findings: There are small bilateral pleural effusions.  There is mild dependent atelectasis in both lower lobes. Coronary artery calcifications are noted. An asymmetric density laterally in the right breast on image 14 is unchanged.  A central hepatic cyst has not significantly changed from the prior CT, measuring up to 4.8 cm on image 29.  The gallbladder is surgically absent. Mild intra and extrahepatic biliary dilatation has developed compared with the prior CT.  The common bile duct measures up to 9 mm in diameter and appears dilated to the ampulla. No pancreatic mass, pancreatic ductal dilatation or ampullary lesion is identified.  There is no surrounding inflammatory change. No other hepatic abnormalities are seen.  The spleen, adrenal glands and kidneys appear normal.  The stomach is incompletely distended.  No small bowel or colonic abnormalities are seen within the abdomen.  There is mild aorto iliac atherosclerosis and lumbar spondylosis.  The pelvis was not imaged.  IMPRESSION:  1.  Progressive  intra and extrahepatic biliary dilatation status post cholecystectomy.  A specific etiology is not demonstrated by this examination.  Distal biliary stricture, small ampullary lesion and choledocholithiasis cannot be excluded. 2.  Small bilateral pleural effusions with associated bibasilar atelectasis. 3.  Stable central hepatic cyst.   Original Report Authenticated By: Carey Bullocks,  M.D.    US Abdomen Complete  01/27/2012  *RADIOLOGY REPORT*  Clinical Data:  Acute pancreatitis, elevated LFTs; past history hypertension, diabetes, hypercholesterolemia, cholecystectomy, appendectomy  ULTRASOUND ABDOMEN:  Technique:  Sonography of upper abdominal structures was performed.  Comparison:  None  Gallbladder:  Surgically absent  Common bile duct:  8 mm diameter, which may be normal post cholecystectomy  Liver:  Echogenic, likely fatty infiltration, though this can be seen with cirrhosis and certain infiltrative disorders.  Cyst right lobe 5.7 x 4.4 x 5.0 cm.  Hepatopetal portal venous flow.  No definite solid mass lesion.  IVC:  Normal appearance  Pancreas:  Heterogeneous echogenicity without mass  Spleen:  Normal morphology, 8.5 cm length.  Right kidney:  11.2 cm length. Normal morphology without mass or hydronephrosis.  Left kidney:  10.9 cm length. Normal morphology without mass or hydronephrosis.  Aorta:  Atherosclerotic plaque formation without aneurysmal dilatation.  Other:  No free fluid  IMPRESSION: Probable fatty infiltration of liver. Large hepatic cyst 5.7 cm diameter. Prominent CBD 8 mm diameter, potentially normal/physiologic post cholecystectomy, recommend correlation with LFTs. Atherosclerotic changes of the abdominal aorta.   Original Report Authenticated By: Ulyses Southward, M.D.     Assessment: 78 year old female admitted with acute pancreatitis and admitting lipase 2119, transaminitis with a direct hyperbilirubinemia. Continues to progress appropriately. CT without specific evidence for CBD stones, but there was progressive intra and extrahepatic biliary dilatation status post cholecystectomy. Currently, lipase has normalized to 58, Tbili normalized, LFTs improving overall. As noted per Dr. Darrick Penna, further imaging as outpatient as pt continues to progress clinically.   In addition, Lovenox continues to be held due to prior evidence of worsening thrombocytopenia; SCDs for DVT  prophylaxis. Plts stable.   ?Consider ERCP vs EUS, at Dr. Darrick Penna' discretion as outpatient.   Plan: Advance diet to low-fat Outpatient imaging per Dr. Darrick Penna Anticipate d/c in near future Will follow peripherally   LOS: 3 days   Kathleen [name]  01/29/2012, 7:54 AM    She is doing very well. Abdominal pain has resolved. Tolerating a regular diet. I anticipate discharge within the next 24 hours. Followup as planned.

## 2012-01-30 ENCOUNTER — Telehealth: Payer: Self-pay | Admitting: Gastroenterology

## 2012-01-30 LAB — GLUCOSE, CAPILLARY
Glucose-Capillary: 126 mg/dL — ABNORMAL HIGH (ref 70–99)
Glucose-Capillary: 120 mg/dL — ABNORMAL HIGH (ref 70–99)

## 2012-01-30 NOTE — Progress Notes (Signed)
Pt discharged home today per Dr. Lendell Caprice. Pt's IV site D/C'd and WNL. Pt's VS stable at this time. Pt provided with home medication list and discharge instructions. Verbalized understanding. Pt left floor via WC in stable condition accompanied by NT.

## 2012-01-30 NOTE — Telephone Encounter (Signed)
PLEASE CALL PT.  SHE NEEDS AN OPV W/ SLF IN 2-3 WEEKS E30.

## 2012-01-30 NOTE — Discharge Summary (Signed)
Physician Discharge Summary  Patient ID: Kathleen [name] MRN: [id number] DOB/AGE: February 05, 1932 78 y.o.  Admit date: 01/26/2012 Discharge date: 01/30/2012  Discharge Diagnoses:  Principal Problem:  *Pancreatitis Active Problems:  Hypertension  Hypokalemia  Elevated LFTs  Hypothyroidism  Hyperlipidemia  Physical deconditioning     Medication List     As of 01/30/2012 11:07 AM    STOP taking these medications         simvastatin 20 MG tablet   Commonly known as: ZOCOR      TAKE these medications         amLODipine 5 MG tablet   Commonly known as: NORVASC   Take 5 mg by mouth daily.      aspirin EC 81 MG tablet   Take 81 mg by mouth daily.      CALTRATE 600 PO   Take 300-600 tablets by mouth 2 (two) times daily. Patient takes 1 tab (600mg ) in the morning and takes 1/2 tablet (300 mg) at night      Cinnamon 500 MG capsule   Take 500 mg by mouth daily.      cyanocobalamin 1000 MCG/ML injection   Commonly known as: (VITAMIN B-12)   Inject 1,000 mcg into the muscle every 30 (thirty) days.      dorzolamide-timolol 22.3-6.8 MG/ML ophthalmic solution   Commonly known as: COSOPT   Place 1 drop into both eyes 2 (two) times daily.      hydrochlorothiazide 25 MG tablet   Commonly known as: HYDRODIURIL   Take 25 mg by mouth daily.      levothyroxine 100 MCG tablet   Commonly known as: SYNTHROID, LEVOTHROID   Take 100 mcg by mouth daily.      lisinopril 2.5 MG tablet   Commonly known as: PRINIVIL,ZESTRIL   Take 2.5 mg by mouth daily.      metFORMIN 500 MG 24 hr tablet   Commonly known as: GLUCOPHAGE-XR   Take 500 mg by mouth 2 (two) times daily.      multivitamin with minerals Tabs   Take 1 tablet by mouth daily.      potassium chloride SA 20 MEQ tablet   Commonly known as: K-DUR,KLOR-CON   Take 30 mEq by mouth daily. Takes 1 tab ( ) at breakfast and takes 1/2 tab (10 meq) at night      PURALUBE 85-15 % Oint   Apply 1 application to eye at bedtime.       REFRESH OPTIVE ADVANCED 0.5-1-0.5 % Soln   Generic drug: Carboxymeth-Glycerin-Polysorb   Apply 1 drop to eye 4 (four) times daily as needed. Dry eyes      ZIOPTAN 0.0015 % Soln   Generic drug: Tafluprost   Apply 1 drop to eye at bedtime.            Discharge Orders    Future Orders Please Complete By Expires   Increase activity slowly      Discharge instructions      Comments:   Low fat diabetic diet.      Follow-up Information    Follow up with [facility].   Contact information:   [address] [place] [address] [phone]      Follow up with Jonette Eva, MD.   Contact information:   [address] [place] [address] [phone]          Disposition: home with home PT  Discharged Condition: stable  Consults:  Treatment Team:  [place], MD  Labs:    Sodium   137 138 142        Potassium   3.1 2.9 3.4        Chloride   103 105 110        CO2   25 22 23         Mean Plasma Glucose   134          BUN   11 5 3         Creatinine, Ser   0.56 0.55 0.50        Calcium   8.3 8.1 8.4        GFR calc non Af Amer   87 88 >90        GFR calc Af Amer   >90 >90 >90        Glucose, Bld   130 73 165        Magnesium   1.6          Alkaline Phosphatase   107 114 105        Albumin   3.3 3.0 3.0        Lipase   2119 58         AST   563 214 83        ALT   413 268 176        Total Protein   6.1 5.7 6.2        Bilirubin, Direct   1.6          Indirect Bilirubin   0.5          Total Bilirubin   2.1 2.1 0.8 0.8        LIPID PROFILE   Cholesterol   130          Triglycerides   81          HDL   49          LDL Cholesterol   65          VLDL   16          Total CHOL/HDL Ratio   2.7          CBC   WBC   4.3 6.9         RBC   4.26 3.94         Hemoglobin   12.5 11.6         HCT   37.7 35.2         MCV   88.5 89.3         MCH   29.3 29.4         MCHC   33.2 33.0         RDW   13.3 13.5          Platelets   116 117         DIFFERENTIAL   Neutrophils Relative   93          Lymphocytes Relative   4          Monocytes Relative   3          Eosinophils Relative   0          Basophils Relative   0          Neutro Abs   6.1          Lymphs Abs   0.3  Monocytes Absolute   0.2          Eosinophils Absolute   0.0          Basophils Absolute   0.0          WBC Morphology   INCREASED BANDS (>20% BANDS)          DIABETES   Hemoglobin A1C   6.3          Glucose, Bld   130 73 165        THYROID   TSH   0.577          URINALYSIS   Color, Urine   YELLOW          APPearance   CLEAR          Specific Gravity, Urine   1.020          pH   6.0          Glucose, UA   NEGATIVE          Bilirubin Urine   NEGATIVE          Ketones, ur   NEGATIVE          Protein, ur   NEGATIVE          Urobilinogen, UA   2.0          Nitrite   NEGATIVE          Leukocytes, UA   NEGATIVE          Hgb urine dipstick   TRACE          RBC / HPF   0-2          STOOL TESTS   C difficile by pcr      NEGATIVE   Diagnostics:  Ct Abdomen W Contrast  01/27/2012  *RADIOLOGY REPORT*  Clinical Data: Epigastric pain radiating into the back with nausea, vomiting and diarrhea for 2 days.  History of hypertension, diabetes, cholecystectomy, appendectomy and hysterectomy.  CT ABDOMEN WITH CONTRAST  Technique:  Multidetector CT imaging of the abdomen was performed following the standard protocol during bolus administration of intravenous contrast.  Contrast: OMNIPAQUE IOHEXOL 300 MG/ML  SOLN  Comparison: Abdominal ultrasound 01/27/2012.  Abdominal pelvic CT 10/14/2007.  Findings: There are small bilateral pleural effusions.  There is mild dependent atelectasis in both lower lobes. Coronary artery calcifications are noted. An asymmetric density laterally in the right breast on image 14 is unchanged.  A central hepatic cyst has not significantly changed from the prior CT, measuring up to 4.8 cm on image 29.  The  gallbladder is surgically absent. Mild intra and extrahepatic biliary dilatation has developed compared with the prior CT.  The common bile duct measures up to 9 mm in diameter and appears dilated to the ampulla. No pancreatic mass, pancreatic ductal dilatation or ampullary lesion is identified.  There is no surrounding inflammatory change. No other hepatic abnormalities are seen.  The spleen, adrenal glands and kidneys appear normal.  The stomach is incompletely distended.  No small bowel or colonic abnormalities are seen within the abdomen.  There is mild aorto iliac atherosclerosis and lumbar spondylosis.  The pelvis was not imaged.  IMPRESSION:  1.  Progressive intra and extrahepatic biliary dilatation status post cholecystectomy.  A specific etiology is not demonstrated by this examination.  Distal biliary stricture, small ampullary lesion and choledocholithiasis cannot be excluded. 2.  Small bilateral pleural effusions with associated bibasilar atelectasis. 3.  Stable  central hepatic cyst.   Original Report Authenticated By: Carey Bullocks, M.D.    US Abdomen Complete  01/27/2012  *RADIOLOGY REPORT*  Clinical Data:  Acute pancreatitis, elevated LFTs; past history hypertension, diabetes, hypercholesterolemia, cholecystectomy, appendectomy  ULTRASOUND ABDOMEN:  Technique:  Sonography of upper abdominal structures was performed.  Comparison:  None  Gallbladder:  Surgically absent  Common bile duct:  8 mm diameter, which may be normal post cholecystectomy  Liver:  Echogenic, likely fatty infiltration, though this can be seen with cirrhosis and certain infiltrative disorders.  Cyst right lobe 5.7 x 4.4 x 5.0 cm.  Hepatopetal portal venous flow.  No definite solid mass lesion.  IVC:  Normal appearance  Pancreas:  Heterogeneous echogenicity without mass  Spleen:  Normal morphology, 8.5 cm length.  Right kidney:  11.2 cm length. Normal morphology without mass or hydronephrosis.  Left kidney:  10.9 cm length.  Normal morphology without mass or hydronephrosis.  Aorta:  Atherosclerotic plaque formation without aneurysmal dilatation.  Other:  No free fluid  IMPRESSION: Probable fatty infiltration of liver. Large hepatic cyst 5.7 cm diameter. Prominent CBD 8 mm diameter, potentially normal/physiologic post cholecystectomy, recommend correlation with LFTs. Atherosclerotic changes of the abdominal aorta.   Original Report Authenticated By: Ulyses Southward, M.D.     Procedures: None  Full Code   Hospital Course: See H&P for complete admission details. The patient is a pleasant 78 year old white female who presented with vomiting and diarrhea for a day. She also had epigastric pain radiating to her back. She denies alcohol use. She is status post cholecystectomy. In the emergency room, she had normal vital signs. She appeared to hydrated and uncomfortable. Dry mucous membranes. She had a soft abdomen with epigastric tenderness. Lipase was 2119. AST 103 ALT 68. Normal alkaline phosphatase. Patient was started on bowel rest, IV fluids, pain medication and anti-emetics. GI was consulted. Her symptoms improved. Her diet was advanced. Her pancreatic enzymes improved. Her abdominal pain resolved. Her vomiting and diarrhea resolved. GI recommends an outpatient endoscopic ultrasound. They will follow her up in the office. Total time on the day of discharge greater than 30 minutes.  Discharge Exam:  Blood pressure 137/59, pulse 78, temperature 97.8 F (36.6 C), temperature source Oral, resp. rate 18, height 5\' 3"  (1.6 m), weight 79.516 kg (175 lb 4.8 oz), SpO2 95.00%.  General: Abdomen soft nontender nondistended  Signed: Ralphael Southgate L 01/30/2012, 11:07 AM

## 2012-02-06 NOTE — Telephone Encounter (Signed)
Pt is aware of OV on 1/23 at 0900 with SF in E30

## 2012-03-04 ENCOUNTER — Encounter: Payer: Self-pay | Admitting: Gastroenterology

## 2012-03-04 ENCOUNTER — Ambulatory Visit (INDEPENDENT_AMBULATORY_CARE_PROVIDER_SITE_OTHER): Payer: Medicare Other | Admitting: Gastroenterology

## 2012-03-04 VITALS — BP 170/90 | HR 99 | Temp 97.8°F | Ht 63.0 in | Wt 162.2 lb

## 2012-03-04 DIAGNOSIS — K859 Acute pancreatitis without necrosis or infection, unspecified: Secondary | ICD-10-CM

## 2012-03-04 LAB — HEPATIC FUNCTION PANEL
ALT: 15 U/L (ref 0–35)
AST: 19 U/L (ref 0–37)
Bilirubin, Direct: 0.1 mg/dL (ref 0.0–0.3)
Indirect Bilirubin: 0.4 mg/dL (ref 0.0–0.9)
Total Protein: 7.3 g/dL (ref 6.0–8.3)

## 2012-03-04 NOTE — Progress Notes (Signed)
Referral has been faxed to Dr. Aleatha Borer and Dr. Dulce Sellar will review notes and contact the patient with date & time

## 2012-03-04 NOTE — Progress Notes (Signed)
Pt is aware of OV on 5/1 at 10 with SF and pt has appt card

## 2012-03-04 NOTE — Assessment & Plan Note (Addendum)
ACUTE, IDIOPATHIC & SX NOW RESOLVED. PT HDA GB REMOVED & TAKES HCTZ.  EUS AS OP TO EVALUATE HOP/BILE DUCT WITHIN THE NEXT 2 WEEKS. EXPLAINED PROCEDURE TO PT. HFP TODAY. LOW FAT DIET OPV IN 4 MOS. CONSIDER D/C HCTZ IF HOP NL AND NO CBD STONES. PT ENCOURAGED TO CALLALERGY MD RE: HIVES/ALLERGY SHOTS.

## 2012-03-04 NOTE — Progress Notes (Signed)
Faxed to PCP

## 2012-03-04 NOTE — Progress Notes (Signed)
Subjective:    Patient ID: Kathleen [name], female    DOB: May 28, 1931, 79 y.o.   MRN: [id number]  PCP: KAPLAN  HPI C/O HIVES & DIARRHEA WHEN SHE LEFT THE HOSPITAL. SAW CHRISTINE MANNING. WAS GIVEN STEROID SHOT/PREDNISONE. EVERY TIME GETS ALLERGY SHOTS SHE BREAKS OUT. GOT ONE YESTERDAY. SUFFERED FOR 4 DAYS-THOUGHT IT WAS A VIRUS BUT HAD A UTI. FEVER UP TO 103.5 F. FEELING BETTER FROM THAT. THEN GOT A CYST ON HER BOTTOM. NOW IT'S BETTER. SAW DR. Doristine Counter. BEEN ON HCTZ FOR OVER A YEAR. NO PROBLEMS WITH FEVER, ABD PAIN, NAUSEA, OR VOMITING. BMS: ONCE, SOFT (BROWN). APPETITE: GOOD. LOST 8 LBS SINCE DEC 2013.  Past Medical History  Diagnosis Date  . Hypertension   . High cholesterol   . Diabetes mellitus without complication     "borderline"  . Hypothyroidism   . Diverticulosis of colon    Past Surgical History  Procedure Date  . Appendectomy   . Vein surgery   . Cholecystectomy 1999    cholelithiasis  . Bladder tac   . Abdominal hysterectomy     partial  . Excision of breast biopsy   . Colonoscopy     Dr Katheran James cannot remember  . Colonoscopy     Dr Arlyce Dice- diverticulosis, pt cannot remember dates  . Esophagogastroduodenoscopy     Dr Myles Lipps cannot remember    No Known Allergies  Current Outpatient Prescriptions  Medication Sig Dispense Refill  . amLODipine (NORVASC) 5 MG tablet Take 5 mg by mouth daily.      Marland Kitchen aspirin EC 81 MG tablet Take 81 mg by mouth daily.      . Calcium Carbonate (CALTRATE 600 PO) Take 300-600 tablets by mouth 2 (two) times daily. Patient takes 1 tab (600mg ) in the morning and takes 1/2 tablet (300 mg) at night      . Carboxymeth-Glycerin-Polysorb (REFRESH OPTIVE ADVANCED) 0.5-1-0.5 % SOLN Apply 1 drop to eye 4 (four) times daily as needed. Dry eyes      . Cinnamon 500 MG capsule Take 500 mg by mouth daily.      . cyanocobalamin (,VITAMIN B-12,) 1000 MCG/ML injection Inject 1,000 mcg into the muscle every 30 (thirty) days.      . dorzolamide-timolol  (COSOPT) 22.3-6.8 MG/ML ophthalmic solution Place 1 drop into both eyes 2 (two) times daily.      . hydrochlorothiazide (HYDRODIURIL) 25 MG tablet Take 25 mg by mouth daily.      Marland Kitchen levothyroxine (SYNTHROID, LEVOTHROID) 100 MCG tablet Take 100 mcg by mouth daily.      Marland Kitchen lisinopril (PRINIVIL,ZESTRIL) 2.5 MG tablet Take 2.5 mg by mouth daily.      . metFORMIN (GLUCOPHAGE-XR) 500 MG 24 hr tablet Take 500 mg by mouth 2 (two) times daily.      . Multiple Vitamin (MULTIVITAMIN WITH MINERALS) TABS Take 1 tablet by mouth daily.      . potassium chloride SA (K-DUR,KLOR-CON) 20 MEQ tablet Take 30 mEq by mouth daily. Takes 1 tab ( ) at breakfast and takes 1/2 tab (10 meq) at night      . Tafluprost (ZIOPTAN) 0.0015 % SOLN Apply 1 drop to eye at bedtime.      Cliffton Asters Petrolatum-Mineral Oil (PURALUBE) 85-15 % OINT Apply 1 application to eye at bedtime.          Review of Systems HAS BAD KNEES    Objective:   Physical Exam  Vitals reviewed. Constitutional: She is oriented to person, place, and time.  She appears well-nourished. No distress.  HENT:  Head: Normocephalic and atraumatic.  Mouth/Throat: Oropharynx is clear and moist.  Eyes: Pupils are equal, round, and reactive to light. No scleral icterus.  Neck: Normal range of motion. Neck supple.  Cardiovascular: Normal rate, regular rhythm and normal heart sounds.   Pulmonary/Chest: Effort normal and breath sounds normal. No respiratory distress.  Abdominal: Soft. Bowel sounds are normal. She exhibits no distension. There is no tenderness.       OBESE   Musculoskeletal: She exhibits no edema.  Lymphadenopathy:    She has no cervical adenopathy.  Neurological: She is alert and oriented to person, place, and time.       NO  NEW FOCAL DEFICITS  ABLE TO GET ON AND OFF EXAM TABLE WITH ASSISTANCE  Psychiatric:       FLAT AFFECT, NL MOOD           Assessment & Plan:

## 2012-03-04 NOTE — Patient Instructions (Addendum)
FOLLOW A LOW FAT DIET. SEE INFO BELOW.  YOU WILL BE SCHEDULED TO SEE DR. Dulce Sellar FOR AN ENDOSCOPIC ULTRASOUND WITHIN THE NEXT 2 WEEKS. I WILL CALL YOU AFTER THE ENDOSCOPIC ULTRASOUND IS COMPLETE.  FOLLOW UP WITH DR. FIELDS IN 4 MOS.  Low-Fat Diet BREADS, CEREALS, PASTA, RICE, DRIED PEAS, AND BEANS These products are high in carbohydrates and most are low in fat. Therefore, they can be increased in the diet as substitutes for fatty foods. They too, however, contain calories and should not be eaten in excess. Cereals can be eaten for snacks as well as for breakfast.   FRUITS AND VEGETABLES It is good to eat fruits and vegetables. Besides being sources of fiber, both are rich in vitamins and some minerals. They help you get the daily allowances of these nutrients. Fruits and vegetables can be used for snacks and desserts.  MEATS Limit lean meat, chicken, [place], and fish to no more than 6 ounces per day. Beef, Pork, and Lamb Use lean cuts of beef, pork, and lamb. Lean cuts include:  Extra-lean ground beef.  Arm roast.  Sirloin tip.  Center-cut ham.  Round steak.  Loin chops.  Rump roast.  Tenderloin.  Trim all fat off the outside of meats before cooking. It is not necessary to severely decrease the intake of red meat, but lean choices should be made. Lean meat is rich in protein and contains a highly absorbable form of iron. Premenopausal women, in particular, should avoid reducing lean red meat because this could increase the risk for low red blood cells (iron-deficiency anemia).  Chicken and [place] These are good sources of protein. The fat of poultry can be reduced by removing the skin and underlying fat layers before cooking. Chicken and [place] can be substituted for lean red meat in the diet. Poultry should not be fried or covered with high-fat sauces. Fish and Shellfish Fish is a good source of protein. Shellfish contain cholesterol, but they usually are low in saturated fatty acids.  The preparation of fish is important. Like chicken and [place], they should not be fried or covered with high-fat sauces. EGGS Egg whites contain no fat or cholesterol. They can be eaten often. Try 1 to 2 egg whites instead of whole eggs in recipes or use egg substitutes that do not contain yolk. MILK AND DAIRY PRODUCTS Use skim or 1% milk instead of 2% or whole milk. Decrease whole milk, natural, and processed cheeses. Use nonfat or low-fat (2%) cottage cheese or low-fat cheeses made from vegetable oils. Choose nonfat or low-fat (1 to 2%) yogurt. Experiment with evaporated skim milk in recipes that call for heavy cream. Substitute low-fat yogurt or low-fat cottage cheese for sour cream in dips and salad dressings. Have at least 2 servings of low-fat dairy products, such as 2 glasses of skim (or 1%) milk each day to help get your daily calcium intake. FATS AND OILS Reduce the total intake of fats, especially saturated fat. Butterfat, lard, and beef fats are high in saturated fat and cholesterol. These should be avoided as much as possible. Vegetable fats do not contain cholesterol, but certain vegetable fats, such as coconut oil, palm oil, and palm kernel oil are very high in saturated fats. These should be limited. These fats are often used in bakery goods, processed foods, popcorn, oils, and nondairy creamers. Vegetable shortenings and some peanut butters contain hydrogenated oils, which are also saturated fats. Read the labels on these foods and check for saturated vegetable oils.  Unsaturated vegetable oils and fats do not raise blood cholesterol. However, they should be limited because they are fats and are high in calories. Total fat should still be limited to 30% of your daily caloric intake. Desirable liquid vegetable oils are corn oil, cottonseed oil, olive oil, canola oil, safflower oil, soybean oil, and sunflower oil. Peanut oil is not as good, but small amounts are acceptable. Buy a heart-healthy  tub margarine that has no partially hydrogenated oils in the ingredients. Mayonnaise and salad dressings often are made from unsaturated fats, but they should also be limited because of their high calorie and fat content. Seeds, nuts, peanut butter, olives, and avocados are high in fat, but the fat is mainly the unsaturated type. These foods should be limited mainly to avoid excess calories and fat. OTHER EATING TIPS Snacks  Most sweets should be limited as snacks. They tend to be rich in calories and fats, and their caloric content outweighs their nutritional value. Some good choices in snacks are graham crackers, melba toast, soda crackers, bagels (no egg), English muffins, fruits, and vegetables. These snacks are preferable to snack crackers, [place] fries, TORTILLA CHIPS, and POTATO chips. Popcorn should be air-popped or cooked in small amounts of liquid vegetable oil. Desserts Eat fruit, low-fat yogurt, and fruit ices instead of pastries, cake, and cookies. Sherbet, angel food cake, gelatin dessert, frozen low-fat yogurt, or other frozen products that do not contain saturated fat (pure fruit juice bars, frozen ice pops) are also acceptable.  COOKING METHODS Choose those methods that use little or no fat. They include: Poaching.  Braising.  Steaming.  Grilling.  Baking.  Stir-frying.  Broiling.  Microwaving.  Foods can be cooked in a nonstick pan without added fat, or use a nonfat cooking spray in regular cookware. Limit fried foods and avoid frying in saturated fat. Add moisture to lean meats by using water, broth, cooking wines, and other nonfat or low-fat sauces along with the cooking methods mentioned above. Soups and stews should be chilled after cooking. The fat that forms on top after a few hours in the refrigerator should be skimmed off. When preparing meals, avoid using excess salt. Salt can contribute to raising blood pressure in some people.  EATING AWAY FROM HOME Order entres,  potatoes, and vegetables without sauces or butter. When meat exceeds the size of a deck of cards (3 to 4 ounces), the rest can be taken home for another meal. Choose vegetable or fruit salads and ask for low-calorie salad dressings to be served on the side. Use dressings sparingly. Limit high-fat toppings, such as bacon, crumbled eggs, cheese, sunflower seeds, and olives. Ask for heart-healthy tub margarine instead of butter.

## 2012-03-05 NOTE — Progress Notes (Signed)
PLEASE CALL PT. HER LIVER TESTS ARE NORMAL.  

## 2012-03-05 NOTE — Progress Notes (Signed)
Informed pt of normal liver tests.

## 2012-03-15 ENCOUNTER — Encounter (HOSPITAL_COMMUNITY): Payer: Self-pay | Admitting: *Deleted

## 2012-03-15 NOTE — Pre-Procedure Instructions (Signed)
Your procedure is scheduled on:Wednesday, March 24, 2012 Report to [facility] Admitting [id number] Call this number if you have problems morning of your procedure:[phone]  Follow all bowel prep instructions per your doctor's orders.  Do not eat or drink anything after midnight the night before your procedure. You may brush your teeth, rinse out your mouth, but no water, no food, no chewing gum, no mints, no candies, no chewing tobacco.     Take these medicines the morning of your procedure with A SIP OF WATER:Norvasc and Levothyroid,use eye drop Cosopt   Please make arrangements for a responsible person to drive you home after the procedure. You cannot go home by cab/taxi. We recommend you have someone with you at home the first 24 hours after your procedure. Driver for procedure is spouse Lorenia Hoston [phone]  LEAVE ALL VALUABLES, JEWELRY, BILLFOLD AT HOME.  NO DENTURES, CONTACT LENSES ALLOWED IN THE ENDOSCOPY ROOM.   YOU MAY WEAR DEODORANT, PLEASE REMOVE ALL JEWELRY, WATCHES RINGS, BODY PIERCINGS AND LEAVE AT HOME.   WOMEN: NO MAKE-UP, LOTIONS PERFUMES

## 2012-03-19 ENCOUNTER — Encounter (HOSPITAL_COMMUNITY): Payer: Self-pay | Admitting: Pharmacy Technician

## 2012-03-24 ENCOUNTER — Ambulatory Visit (HOSPITAL_COMMUNITY): Payer: Medicare Other | Admitting: Anesthesiology

## 2012-03-24 ENCOUNTER — Encounter (HOSPITAL_COMMUNITY): Payer: Self-pay | Admitting: Anesthesiology

## 2012-03-24 ENCOUNTER — Encounter (HOSPITAL_COMMUNITY): Payer: Self-pay | Admitting: *Deleted

## 2012-03-24 ENCOUNTER — Ambulatory Visit (HOSPITAL_COMMUNITY)
Admission: RE | Admit: 2012-03-24 | Discharge: 2012-03-24 | Disposition: A | Discharge status: Home / Self Care | Payer: Medicare Other | Source: Ambulatory Visit | Attending: Gastroenterology | Admitting: Gastroenterology

## 2012-03-24 ENCOUNTER — Encounter (HOSPITAL_COMMUNITY)
Admission: RE | Disposition: A | Discharge status: Home / Self Care | Payer: Self-pay | Source: Ambulatory Visit | Attending: Gastroenterology

## 2012-03-24 DIAGNOSIS — K859 Acute pancreatitis without necrosis or infection, unspecified: Secondary | ICD-10-CM | POA: Insufficient documentation

## 2012-03-24 DIAGNOSIS — R7989 Other specified abnormal findings of blood chemistry: Secondary | ICD-10-CM | POA: Insufficient documentation

## 2012-03-24 DIAGNOSIS — Z9089 Acquired absence of other organs: Secondary | ICD-10-CM | POA: Insufficient documentation

## 2012-03-24 HISTORY — DX: Acute pancreatitis without necrosis or infection, unspecified: K85.90

## 2012-03-24 HISTORY — DX: Unspecified osteoarthritis, unspecified site: M19.90

## 2012-03-24 HISTORY — PX: EUS: SHX5427

## 2012-03-24 LAB — GLUCOSE, CAPILLARY: Glucose-Capillary: 125 mg/dL — ABNORMAL HIGH (ref 70–99)

## 2012-03-24 LAB — BASIC METABOLIC PANEL
BUN: 12 mg/dL (ref 6–23)
Chloride: 103 mEq/L (ref 96–112)
Creatinine, Ser: 0.56 mg/dL (ref 0.50–1.10)
GFR calc Af Amer: >90 mL/min (ref >90)
Glucose, Bld: 132 mg/dL — ABNORMAL HIGH (ref 70–99)

## 2012-03-24 SURGERY — ESOPHAGEAL ENDOSCOPIC ULTRASOUND (EUS) RADIAL
Anesthesia: Monitor Anesthesia Care

## 2012-03-24 MED ORDER — LACTATED RINGERS IV SOLN: INTRAVENOUS | Status: DC

## 2012-03-24 MED ORDER — LIDOCAINE HCL (CARDIAC) 20 MG/ML IV SOLN
INTRAVENOUS | Status: DC | PRN
  Administered 2012-03-24: 50 mg via INTRAVENOUS

## 2012-03-24 MED ORDER — ONDANSETRON HCL 4 MG/2ML IJ SOLN
INTRAMUSCULAR | Status: DC | PRN
  Administered 2012-03-24: 4 mg via INTRAVENOUS

## 2012-03-24 MED ORDER — FENTANYL CITRATE 0.05 MG/ML IJ SOLN
INTRAMUSCULAR | Status: DC | PRN
  Administered 2012-03-24: 50 ug via INTRAVENOUS

## 2012-03-24 MED ORDER — SODIUM CHLORIDE 0.9 % IV SOLN
INTRAVENOUS | Status: DC
  Administered 2012-03-24: 11:00:00 via INTRAVENOUS

## 2012-03-24 MED ORDER — MIDAZOLAM HCL 5 MG/5ML IJ SOLN
INTRAMUSCULAR | Status: DC | PRN
  Administered 2012-03-24: 1 mg via INTRAVENOUS

## 2012-03-24 MED ORDER — BUTAMBEN-TETRACAINE-BENZOCAINE 2-2-14 % EX AERO
INHALATION_SPRAY | CUTANEOUS | Status: DC | PRN
  Administered 2012-03-24: 2 via TOPICAL

## 2012-03-24 MED ORDER — PROPOFOL INFUSION 10 MG/ML OPTIME
INTRAVENOUS | Status: DC | PRN
  Administered 2012-03-24: 140 ug/kg/min via INTRAVENOUS

## 2012-03-24 MED ORDER — FENTANYL CITRATE 0.05 MG/ML IJ SOLN: 25.0000 ug | INTRAMUSCULAR | Status: DC | PRN

## 2012-03-24 NOTE — Interval H&P Note (Signed)
History and Physical Interval Note:  03/24/2012 11:01 AM  Kathleen [name]  has presented today for surgery, with the diagnosis of acute p[ancreatitis/elevated enzyme  The various methods of treatment have been discussed with the patient and family. After consideration of risks, benefits and other options for treatment, the patient has consented to  Procedure(s): ESOPHAGEAL ENDOSCOPIC ULTRASOUND (EUS) RADIAL (N/A) as a surgical intervention .  The patient's history has been reviewed, patient examined, no change in status, stable for surgery.  I have reviewed the patient's chart and labs.  Questions were answered to the patient's satisfaction.     Sharvil Hoey M  Assessment:  1.  Idiopathic pancreatitis.  Post-cholecystectomy.  No alcohol.  2.  Prominent bile duct (9mm) post-cholecystectomy. 3.  Elevated LFTs at time of pancreatitis, now resolved.  Plan:  1.  Endoscopic ultrasound with possible fine needle aspiration. 2.  Risks (bleeding, infection, bowel perforation that could require surgery, sedation-related changes in cardiopulmonary systems), benefits (identification and possible treatment of source of symptoms, exclusion of certain causes of symptoms), and alternatives (watchful waiting, radiographic imaging studies, empiric medical treatment) of upper endoscopy with ultrasound (EUS +/- FNA) were explained to patient/family in detail and patient wishes to proceed.

## 2012-03-24 NOTE — Op Note (Signed)
[facility] [address] [place], [address]   ENDOSCOPIC ULTRASOUND PROCEDURE REPORT  PATIENT: Kathleen, [name]  MR#: [id number] BIRTHDATE: 1931-09-23  GENDER: Female ENDOSCOPIST: Willis Modena, MD REFERRED BY:  Jonette Eva, M.D.  [name], [place] PROCEDURE DATE:  03/24/2012 PROCEDURE:   Upper EUS ASA CLASS:      Class III INDICATIONS:   1.  idiopathic pancreatitis, elevated LFTs. MEDICATIONS: Cetacaine spray x 2 and MAC sedation, administered by CRNA  DESCRIPTION OF PROCEDURE:   After the risks benefits and alternatives of the procedure were  explained, informed consent was obtained. The patient was then placed in the left, lateral, decubitus postion and IV sedation was administered. Throughout the procedure, the patients blood pressure, pulse and oxygen saturations were monitored continuously.  Under direct visualization, the Pentax Radial EUS [id number]  endoscope was introduced through the mouth  and advanced to the    .  Water was used as necessary to provide an acoustic interface.  Upon completion of the imaging, water was removed and the patient was sent to the recovery room in satisfactory condition.   FINDINGS:      Bile duct prominent, maximally   9 mm, tapering to normal caliber by the level of the ampulla.  No bile duct wall thickening, mass or choledocholithiasis was seen. Post-cholecystectomy.  Diffusely hyperechoic liver, consistent with hepatic steatosis.  Ampulla normal endoscopically and ultrasonographically. Pancreas had diffuse and prominent lobularity, hyperechoic strands, hyperechoic foci, hyperechoic pancreatitic duct walls and visible pancreatic ductal side branches; all these findings are most typical of chronic pancreatitis; findings such as these can linger from prior bout of acute pancreatitis, but this is less favored given the significant lobularity identified.  No pancreatic mass seen, but sensitivity of endoscopic ultrasound  in detecting pancreatic masses in setting of diffuse chronic pancreatitis is relatively low.  IMPRESSION:     As above.  Suspect passed CBD stone as cause of patient's prior acute pancreatitis (she is currently asymptomatic and her liver tests have normalized).  Multiple findings suggestive of chronic pancreatitis, though the clinical significance, since she is asymptomatic, is not clear.  No evidence of pancreatic mass, mindful of EUS limitations in setting of chronic pancreatitis (as above).  RECOMMENDATIONS:     1.  Watch for potential complications of procedure. 2.  As long as patient stays asymptomatic, would not do any further investigation into her prior acute pancreatitis. 3.  Will discuss with Dr. Darrick Penna.   _______________________________ Rosalie DoctorWillis Modena, MD 03/24/2012 11:57 AM CC:

## 2012-03-24 NOTE — H&P (View-Only) (Signed)
PLEASE CALL PT. HER LIVER TESTS ARE NORMAL.  

## 2012-03-24 NOTE — Anesthesia Preprocedure Evaluation (Addendum)
Anesthesia Evaluation  Patient identified by MRN, date of birth, ID band Patient awake    Reviewed: Allergy & Precautions, H&P , NPO status , Patient's Chart, lab work & pertinent test results  Airway Mallampati: II TM Distance: >3 FB Neck ROM: full    Dental  (+) Edentulous Upper, Edentulous Lower and Dental Advisory Given   Pulmonary neg pulmonary ROS,  breath sounds clear to auscultation  Pulmonary exam normal       Cardiovascular Exercise Tolerance: Good hypertension, Pt. on medications Rhythm:regular Rate:Normal     Neuro/Psych negative neurological ROS  negative psych ROS   GI/Hepatic negative GI ROS, Neg liver ROS, Elevated LFT's   Endo/Other  diabetes, Well Controlled, Type 2, Oral Hypoglycemic AgentsHypothyroidism   Renal/GU negative Renal ROS  negative genitourinary   Musculoskeletal   Abdominal   Peds  Hematology negative hematology ROS (+)   Anesthesia Other Findings   Reproductive/Obstetrics negative OB ROS                          Anesthesia Physical Anesthesia Plan  ASA: III  Anesthesia Plan: MAC   Post-op Pain Management:    Induction:   Airway Management Planned:   Additional Equipment:   Intra-op Plan:   Post-operative Plan:   Informed Consent: I have reviewed the patients History and Physical, chart, labs and discussed the procedure including the risks, benefits and alternatives for the proposed anesthesia with the patient or authorized representative who has indicated his/her understanding and acceptance.   Dental Advisory Given  Plan Discussed with: CRNA and Surgeon  Anesthesia Plan Comments:         Anesthesia Quick Evaluation

## 2012-03-24 NOTE — Transfer of Care (Signed)
Immediate Anesthesia Transfer of Care Note  Patient: Kathleen [name]  Procedure(s) Performed: Procedure(s): ESOPHAGEAL ENDOSCOPIC ULTRASOUND (EUS) RADIAL (N/A)  Patient Location: PACU  Anesthesia Type:MAC  Level of Consciousness: awake, alert , oriented and patient cooperative  Airway & Oxygen Therapy: Patient Spontanous Breathing and Patient connected to nasal cannula oxygen  Post-op Assessment: Report given to PACU RN, Post -op Vital signs reviewed and stable and Patient moving all extremities X 4  Post vital signs: stable  Complications: No apparent anesthesia complications

## 2012-03-24 NOTE — Anesthesia Postprocedure Evaluation (Signed)
  Anesthesia Post-op Note  Patient: Kathleen [name]  Procedure(s) Performed: Procedure(s) (LRB): ESOPHAGEAL ENDOSCOPIC ULTRASOUND (EUS) RADIAL (N/A)  Patient Location: PACU  Anesthesia Type: MAC  Level of Consciousness: awake and alert   Airway and Oxygen Therapy: Patient Spontanous Breathing  Post-op Pain: mild  Post-op Assessment: Post-op Vital signs reviewed, Patient's Cardiovascular Status Stable, Respiratory Function Stable, Patent Airway and No signs of Nausea or vomiting  Last Vitals:  Filed Vitals:   03/24/12 1220  BP: 130/59  Temp:   Resp: 19    Post-op Vital Signs: stable   Complications: No apparent anesthesia complications

## 2012-03-27 ENCOUNTER — Encounter (HOSPITAL_COMMUNITY): Payer: Self-pay | Admitting: Gastroenterology

## 2012-05-23 ENCOUNTER — Emergency Department (HOSPITAL_COMMUNITY)
Admission: EM | Admit: 2012-05-23 | Discharge: 2012-05-23 | Disposition: A | Discharge status: Home / Self Care | Payer: Medicare Other | Attending: Emergency Medicine | Admitting: Emergency Medicine

## 2012-05-23 ENCOUNTER — Encounter (HOSPITAL_COMMUNITY): Payer: Self-pay | Admitting: Emergency Medicine

## 2012-05-23 DIAGNOSIS — I809 Phlebitis and thrombophlebitis of unspecified site: Secondary | ICD-10-CM | POA: Insufficient documentation

## 2012-05-23 DIAGNOSIS — L02419 Cutaneous abscess of limb, unspecified: Secondary | ICD-10-CM | POA: Insufficient documentation

## 2012-05-23 DIAGNOSIS — I10 Essential (primary) hypertension: Secondary | ICD-10-CM | POA: Insufficient documentation

## 2012-05-23 DIAGNOSIS — E7800 Pure hypercholesterolemia, unspecified: Secondary | ICD-10-CM | POA: Insufficient documentation

## 2012-05-23 DIAGNOSIS — E119 Type 2 diabetes mellitus without complications: Secondary | ICD-10-CM | POA: Insufficient documentation

## 2012-05-23 DIAGNOSIS — I82409 Acute embolism and thrombosis of unspecified deep veins of unspecified lower extremity: Secondary | ICD-10-CM | POA: Insufficient documentation

## 2012-05-23 DIAGNOSIS — L03119 Cellulitis of unspecified part of limb: Secondary | ICD-10-CM | POA: Insufficient documentation

## 2012-05-23 DIAGNOSIS — L0390 Cellulitis, unspecified: Secondary | ICD-10-CM

## 2012-05-23 DIAGNOSIS — Z7982 Long term (current) use of aspirin: Secondary | ICD-10-CM | POA: Insufficient documentation

## 2012-05-23 DIAGNOSIS — Z8719 Personal history of other diseases of the digestive system: Secondary | ICD-10-CM | POA: Insufficient documentation

## 2012-05-23 DIAGNOSIS — Z79899 Other long term (current) drug therapy: Secondary | ICD-10-CM | POA: Insufficient documentation

## 2012-05-23 DIAGNOSIS — M129 Arthropathy, unspecified: Secondary | ICD-10-CM | POA: Insufficient documentation

## 2012-05-23 DIAGNOSIS — E039 Hypothyroidism, unspecified: Secondary | ICD-10-CM | POA: Insufficient documentation

## 2012-05-23 LAB — POCT I-STAT, CHEM 8
BUN: 12 mg/dL (ref 6–23)
Calcium, Ion: 1.25 mmol/L (ref 1.13–1.30)
Chloride: 103 mEq/L (ref 96–112)
Creatinine, Ser: 0.7 mg/dL (ref 0.50–1.10)
Glucose, Bld: 117 mg/dL — ABNORMAL HIGH (ref 70–99)
TCO2: 26 mmol/L (ref 0–100)

## 2012-05-23 LAB — CBC WITH DIFFERENTIAL/PLATELET
Eosinophils Relative: 1 % (ref 0–5)
HCT: 39.6 % (ref 36.0–46.0)
Hemoglobin: 13.4 g/dL (ref 12.0–15.0)
Lymphocytes Relative: 16 % (ref 12–46)
Lymphs Abs: 1.5 10*3/uL (ref 0.7–4.0)
MCV: 87 fL (ref 78.0–100.0)
Monocytes Absolute: 1.1 10*3/uL — ABNORMAL HIGH (ref 0.1–1.0)
Monocytes Relative: 11 % (ref 3–12)
Neutro Abs: 7.1 10*3/uL (ref 1.7–7.7)
RBC: 4.55 MIL/uL (ref 3.87–5.11)
RDW: 13.3 % (ref 11.5–15.5)
WBC: 9.8 10*3/uL (ref 4.0–10.5)

## 2012-05-23 MED ORDER — CLINDAMYCIN HCL 300 MG PO CAPS: 300.0000 mg | ORAL_CAPSULE | Freq: Three times a day (TID) | ORAL | Status: DC

## 2012-05-23 NOTE — Progress Notes (Addendum)
VASCULAR LAB PRELIMINARY  PRELIMINARY  PRELIMINARY  PRELIMINARY  Left lower extremity venous Doppler completed.    Preliminary report:  There is no DVT or SVT noted in the left lower extremity.  There are varicose veins noted throughout the calf, but none appear thrombosed, possibly consistent with phlebitis.  Yenifer Saccente, RVT 05/23/2012, 5:29 PM

## 2012-05-23 NOTE — ED Notes (Signed)
Patient presents with left leg pain, redness, and warmth since Wednesday.

## 2012-05-23 NOTE — ED Provider Notes (Signed)
History     CSN: [id number]  Arrival date & time 05/23/12  1424   First MD Initiated Contact with Patient 05/23/12 1501      Chief Complaint  Patient presents with  . DVT    R/O    (Consider location/radiation/quality/duration/timing/severity/associated sxs/prior treatment) HPI Comments: Pt states that 5 days ago she developed a small area of redness to her left lower leg:pt states that the redness and warmth has increased:pt denies any known injury:pt states that she had a clot about 25 years ago and was on blood thinner for a short period of time:pt states that she has not had any cp or sob  The history is provided by the patient. No language interpreter was used.    Past Medical History  Diagnosis Date  . Hypertension   . High cholesterol   . Diabetes mellitus without complication     "borderline"  . Hypothyroidism   . Diverticulosis of colon   . Arthritis   . Pancreatitis     Past Surgical History  Procedure Laterality Date  . Appendectomy    . Vein surgery    . Cholecystectomy  1999    cholelithiasis  . Bladder tac    . Abdominal hysterectomy      partial  . Excision of breast biopsy    . Colonoscopy      Dr Katheran James cannot remember  . Colonoscopy      Dr Arlyce Dice- diverticulosis, pt cannot remember dates  . Esophagogastroduodenoscopy      Dr Myles Lipps cannot remember  . Eus N/A 03/24/2012    Procedure: ESOPHAGEAL ENDOSCOPIC ULTRASOUND (EUS) RADIAL;  Surgeon: Willis Modena, MD;  Location: [facility] ENDOSCOPY;  Service: Endoscopy;  Laterality: N/A;    Family History  Problem Relation Age of Onset  . Diabetes Father   . Diabetes Mother   . Cancer Mother     ovarian  . Cancer Sister 37    breast  . Cancer Sister 35    bone    History  Substance Use Topics  . Smoking status: Never Smoker   . Smokeless tobacco: Never Used  . Alcohol Use: No    OB History   Grav Para Term Preterm Abortions TAB SAB Ect Mult Living                  Review of Systems   Constitutional: Negative.   Respiratory: Negative.   Cardiovascular: Negative.     Allergies  Review of patient's allergies indicates no known allergies.  Home Medications   Current Outpatient Rx  Name  Route  Sig  Dispense  Refill  . amLODipine (NORVASC) 5 MG tablet   Oral   Take 5 mg by mouth every morning.          Marland Kitchen aspirin EC 81 MG tablet   Oral   Take 81 mg by mouth at bedtime.          . Calcium Carbonate (CALTRATE 600 PO)   Oral   Take 300-600 tablets by mouth 2 (two) times daily. Patient takes 1 tab (600mg ) in the morning and takes 1/2 tablet (300 mg) at night         . Carboxymeth-Glycerin-Polysorb (REFRESH OPTIVE ADVANCED) 0.5-1-0.5 % SOLN   Ophthalmic   Apply 1 drop to eye 4 (four) times daily as needed. Dry eyes         . Cinnamon 500 MG capsule   Oral   Take 500 mg by mouth daily.         Marland Kitchen  cyanocobalamin (,VITAMIN B-12,) 1000 MCG/ML injection   Intramuscular   Inject 1,000 mcg into the muscle every 30 (thirty) days.         . dorzolamide-timolol (COSOPT) 22.3-6.8 MG/ML ophthalmic solution   Both Eyes   Place 1 drop into both eyes 2 (two) times daily.         . fish oil-omega-3 fatty acids 1000 MG capsule   Oral   Take 1 g by mouth daily.         . hydrochlorothiazide (HYDRODIURIL) 25 MG tablet   Oral   Take 25 mg by mouth every morning.          Marland Kitchen levothyroxine (SYNTHROID, LEVOTHROID) 100 MCG tablet   Oral   Take 100 mcg by mouth every morning.          Marland Kitchen lisinopril (PRINIVIL,ZESTRIL) 2.5 MG tablet   Oral   Take 2.5 mg by mouth at bedtime.          . metFORMIN (GLUCOPHAGE-XR) 500 MG 24 hr tablet   Oral   Take 500 mg by mouth 2 (two) times daily.         . Multiple Vitamin (MULTIVITAMIN WITH MINERALS) TABS   Oral   Take 1 tablet by mouth daily.         . potassium chloride SA (K-DUR,KLOR-CON) 20 MEQ tablet   Oral   Take 10-20 mEq by mouth daily. Takes 1 tab ( ) at breakfast and takes 1/2 tab (10 meq) at  night         . Tafluprost (ZIOPTAN) 0.0015 % SOLN   Ophthalmic   Apply 1 drop to eye at bedtime.         Cliffton Asters Petrolatum-Mineral Oil (PURALUBE) 85-15 % OINT   Ophthalmic   Apply 1 application to eye at bedtime.           BP 145/61  Pulse 97  Temp(Src) 98.9 F (37.2 C) (Oral)  Resp 18  SpO2 95%  Physical Exam  Nursing note and vitals reviewed. Constitutional: She is oriented to person, place, and time. She appears well-developed and well-nourished.  HENT:  Head: Normocephalic and atraumatic.  Eyes: Conjunctivae and EOM are normal.  Neck: Neck supple.  Cardiovascular: Normal rate and regular rhythm.   Pulmonary/Chest: Effort normal and breath sounds normal.  Musculoskeletal: Normal range of motion.  Neurological: She is alert and oriented to person, place, and time.  Skin:  Pt has redness and warmth noted to left medial leg with small bruised area    ED Course  Procedures (including critical care time)  Labs Reviewed  CBC WITH DIFFERENTIAL - Abnormal; Notable for the following:    Monocytes Absolute 1.1 (*)    All other components within normal limits  POCT I-STAT, CHEM 8 - Abnormal; Notable for the following:    Glucose, Bld 117 (*)    All other components within normal limits   No results found.   1. Phlebitis   2. Cellulitis       MDM  Pt is okay to go home:dopler tech states likely phlebitis:will treat for a possible cellulitis:pt instructed on thing to look for that would warrant return        Teressa Lower, NP 05/23/12 1801

## 2012-05-23 NOTE — ED Notes (Signed)
[id number]<GN> Expected date:05/23/12<BR> Expected time: 2:20 PM<BR> Means of arrival:Ambulance<BR> Comments:<BR> DVT

## 2012-05-23 NOTE — ED Provider Notes (Signed)
Medical screening examination/treatment/procedure(s) were performed by non-physician practitioner and as supervising physician I was immediately available for consultation/collaboration.   Rolan Bucco, MD 05/23/12 2103

## 2012-06-10 ENCOUNTER — Ambulatory Visit: Payer: Medicare Other | Admitting: Gastroenterology

## 2012-06-17 ENCOUNTER — Other Ambulatory Visit (HOSPITAL_COMMUNITY): Payer: Self-pay | Admitting: Family Medicine

## 2012-06-17 DIAGNOSIS — M8580 Other specified disorders of bone density and structure, unspecified site: Secondary | ICD-10-CM

## 2012-06-17 DIAGNOSIS — Z1231 Encounter for screening mammogram for malignant neoplasm of breast: Secondary | ICD-10-CM

## 2012-07-26 ENCOUNTER — Other Ambulatory Visit (HOSPITAL_COMMUNITY): Payer: Self-pay | Admitting: Family Medicine

## 2012-07-26 ENCOUNTER — Ambulatory Visit (HOSPITAL_COMMUNITY)
Admission: RE | Admit: 2012-07-26 | Discharge: 2012-07-26 | Disposition: A | Discharge status: Home / Self Care | Payer: Medicare Other | Source: Ambulatory Visit | Attending: Family Medicine | Admitting: Family Medicine

## 2012-07-26 DIAGNOSIS — M8580 Other specified disorders of bone density and structure, unspecified site: Secondary | ICD-10-CM

## 2012-07-26 DIAGNOSIS — Z13820 Encounter for screening for osteoporosis: Secondary | ICD-10-CM | POA: Insufficient documentation

## 2012-07-26 DIAGNOSIS — Z780 Asymptomatic menopausal state: Secondary | ICD-10-CM | POA: Insufficient documentation

## 2012-07-26 DIAGNOSIS — Z1231 Encounter for screening mammogram for malignant neoplasm of breast: Secondary | ICD-10-CM

## 2013-03-29 ENCOUNTER — Encounter (HOSPITAL_COMMUNITY): Payer: Self-pay | Admitting: Emergency Medicine

## 2013-03-29 ENCOUNTER — Emergency Department (HOSPITAL_COMMUNITY): Payer: Medicare Other

## 2013-03-29 ENCOUNTER — Emergency Department (HOSPITAL_COMMUNITY)
Admission: EM | Admit: 2013-03-29 | Discharge: 2013-03-29 | Disposition: A | Discharge status: Home / Self Care | Payer: Medicare Other | Attending: Emergency Medicine | Admitting: Emergency Medicine

## 2013-03-29 DIAGNOSIS — R197 Diarrhea, unspecified: Secondary | ICD-10-CM | POA: Insufficient documentation

## 2013-03-29 DIAGNOSIS — Z90710 Acquired absence of both cervix and uterus: Secondary | ICD-10-CM | POA: Insufficient documentation

## 2013-03-29 DIAGNOSIS — Z9089 Acquired absence of other organs: Secondary | ICD-10-CM | POA: Insufficient documentation

## 2013-03-29 DIAGNOSIS — Z79899 Other long term (current) drug therapy: Secondary | ICD-10-CM | POA: Insufficient documentation

## 2013-03-29 DIAGNOSIS — E039 Hypothyroidism, unspecified: Secondary | ICD-10-CM | POA: Insufficient documentation

## 2013-03-29 DIAGNOSIS — E119 Type 2 diabetes mellitus without complications: Secondary | ICD-10-CM | POA: Insufficient documentation

## 2013-03-29 DIAGNOSIS — I10 Essential (primary) hypertension: Secondary | ICD-10-CM | POA: Insufficient documentation

## 2013-03-29 DIAGNOSIS — M129 Arthropathy, unspecified: Secondary | ICD-10-CM | POA: Insufficient documentation

## 2013-03-29 DIAGNOSIS — Z7982 Long term (current) use of aspirin: Secondary | ICD-10-CM | POA: Insufficient documentation

## 2013-03-29 DIAGNOSIS — R109 Unspecified abdominal pain: Secondary | ICD-10-CM | POA: Insufficient documentation

## 2013-03-29 DIAGNOSIS — Z8719 Personal history of other diseases of the digestive system: Secondary | ICD-10-CM | POA: Insufficient documentation

## 2013-03-29 LAB — CBC WITH DIFFERENTIAL/PLATELET
BASOS PCT: 0 % (ref 0–1)
Basophils Absolute: 0 10*3/uL (ref 0.0–0.1)
Eosinophils Absolute: 0.1 10*3/uL (ref 0.0–0.7)
Eosinophils Relative: 2 % (ref 0–5)
HCT: 41.8 % (ref 36.0–46.0)
HEMOGLOBIN: 14.1 g/dL (ref 12.0–15.0)
LYMPHS ABS: 1.6 10*3/uL (ref 0.7–4.0)
Lymphocytes Relative: 22 % (ref 12–46)
MCH: 29.8 pg (ref 26.0–34.0)
MCHC: 33.7 g/dL (ref 30.0–36.0)
MCV: 88.4 fL (ref 78.0–100.0)
MONOS PCT: 8 % (ref 3–12)
Monocytes Absolute: 0.6 10*3/uL (ref 0.1–1.0)
NEUTROS ABS: 5.1 10*3/uL (ref 1.7–7.7)
NEUTROS PCT: 69 % (ref 43–77)
Platelets: 197 10*3/uL (ref 150–400)
RBC: 4.73 MIL/uL (ref 3.87–5.11)
RDW: 13.1 % (ref 11.5–15.5)
WBC: 7.4 10*3/uL (ref 4.0–10.5)

## 2013-03-29 LAB — URINALYSIS, ROUTINE W REFLEX MICROSCOPIC
Bilirubin Urine: NEGATIVE
Glucose, UA: NEGATIVE mg/dL
KETONES UR: NEGATIVE mg/dL
Leukocytes, UA: NEGATIVE
Nitrite: NEGATIVE
PROTEIN: NEGATIVE mg/dL
Specific Gravity, Urine: 1.01 (ref 1.005–1.030)
Urobilinogen, UA: 0.2 mg/dL (ref 0.0–1.0)
pH: 6.5 (ref 5.0–8.0)

## 2013-03-29 LAB — COMPREHENSIVE METABOLIC PANEL
ALBUMIN: 4.3 g/dL (ref 3.5–5.2)
ALK PHOS: 73 U/L (ref 39–117)
ALT: 18 U/L (ref 0–35)
AST: 24 U/L (ref 0–37)
BILIRUBIN TOTAL: 0.4 mg/dL (ref 0.3–1.2)
BUN: 15 mg/dL (ref 6–23)
CHLORIDE: 101 meq/L (ref 96–112)
CO2: 28 mEq/L (ref 19–32)
Calcium: 9.9 mg/dL (ref 8.4–10.5)
Creatinine, Ser: 0.64 mg/dL (ref 0.50–1.10)
GFR calc Af Amer: >90 mL/min (ref >90)
GFR calc non Af Amer: 83 mL/min — ABNORMAL LOW (ref >90)
Glucose, Bld: 129 mg/dL — ABNORMAL HIGH (ref 70–99)
POTASSIUM: 3.6 meq/L — AB (ref 3.7–5.3)
Sodium: 142 mEq/L (ref 137–147)
Total Protein: 8.1 g/dL (ref 6.0–8.3)

## 2013-03-29 LAB — URINE MICROSCOPIC-ADD ON

## 2013-03-29 LAB — LIPASE, BLOOD: Lipase: 54 U/L (ref 11–59)

## 2013-03-29 MED ORDER — ONDANSETRON HCL 4 MG/2ML IJ SOLN
4.0000 mg | Freq: Once | INTRAMUSCULAR | Status: DC
  Filled 2013-03-29: qty 2

## 2013-03-29 MED ORDER — SODIUM CHLORIDE 0.9 % IV BOLUS (SEPSIS)
500.0000 mL | Freq: Once | INTRAVENOUS | Status: AC
  Administered 2013-03-29: 500 mL via INTRAVENOUS

## 2013-03-29 MED ORDER — IOHEXOL 300 MG/ML  SOLN
100.0000 mL | Freq: Once | INTRAMUSCULAR | Status: AC | PRN
  Administered 2013-03-29: 100 mL via INTRAVENOUS

## 2013-03-29 MED ORDER — MORPHINE SULFATE 4 MG/ML IJ SOLN
2.0000 mg | Freq: Once | INTRAMUSCULAR | Status: DC
  Filled 2013-03-29: qty 1

## 2013-03-29 MED ORDER — IOHEXOL 300 MG/ML  SOLN
50.0000 mL | Freq: Once | INTRAMUSCULAR | Status: AC | PRN
  Administered 2013-03-29: 50 mL via ORAL

## 2013-03-29 NOTE — ED Notes (Addendum)
Pt reports "i would rather be in pain than to take morphine." Pt reports bad past experience with morphine due to vomiting. Pt educated on effects of zofran with pain med admin. Pt verbalized understanding but denies wanting morphine or anything for pain at this time. EDP aware and reported to d/c current order and re-assess pt's pain as needed.

## 2013-03-29 NOTE — ED Notes (Signed)
Patient c/o generalized abd pain with diarrhea that started this morning. Denies any nausea or vomiting. Unsure of any fever.

## 2013-03-29 NOTE — ED Provider Notes (Signed)
CSN: [id number]     Arrival date & time 03/29/13  1235 History   First MD Initiated Contact with Patient 03/29/13 1253     Chief Complaint  Patient presents with  . Abdominal Pain     (Consider location/radiation/quality/duration/timing/severity/associated sxs/prior Treatment) HPI  This is a 80 year old female with a history of hypertension, hyperlipidemia, diabetes, and pancreatitis who presents with abdominal pain. Patient reports onset of symptoms this morning. She reports diffuse nonradiating abdominal pain. She's had multiple episodes of nonbloody diarrhea, 6+ total. She denies any nausea or vomiting. Pain is worse with food. She denies any fevers. She states this feels like which is diagnosed with pancreatitis. Current pain is 5/10. She denies any chest pain, cough, shortness of breath, urinary symptoms.  Past Medical History  Diagnosis Date  . Hypertension   . High cholesterol   . Diabetes mellitus without complication     "borderline"  . Hypothyroidism   . Diverticulosis of colon   . Arthritis   . Pancreatitis    Past Surgical History  Procedure Laterality Date  . Appendectomy    . Vein surgery    . Cholecystectomy  1999    cholelithiasis  . Bladder tac    . Abdominal hysterectomy      partial  . Excision of breast biopsy    . Colonoscopy      Dr Reesa Chew cannot remember  . Colonoscopy      Dr Deatra Ina- diverticulosis, pt cannot remember dates  . Esophagogastroduodenoscopy      Dr Damaris Hippo cannot remember  . Eus N/A 03/24/2012    Procedure: ESOPHAGEAL ENDOSCOPIC ULTRASOUND (EUS) RADIAL;  Surgeon: Arta Silence, MD;  Location: [facility];  Service: Endoscopy;  Laterality: N/A;   Family History  Problem Relation Age of Onset  . Diabetes Father   . Diabetes Mother   . Cancer Mother     ovarian  . Cancer Sister 49    breast  . Cancer Sister 33    bone   History  Substance Use Topics  . Smoking status: Never Smoker   . Smokeless tobacco: Never Used  .  Alcohol Use: No   OB History   Grav Para Term Preterm Abortions TAB SAB Ect Mult Living   2 2 2       1      Review of Systems  Constitutional: Negative for fever.  Respiratory: Negative for cough, chest tightness and shortness of breath.   Cardiovascular: Negative for chest pain.  Gastrointestinal: Positive for abdominal pain and diarrhea. Negative for nausea, vomiting and blood in stool.  Genitourinary: Negative for dysuria.  Musculoskeletal: Negative for back pain.  Skin: Negative for wound.  Neurological: Negative for headaches.  All other systems reviewed and are negative.      Allergies  Review of patient's allergies indicates no known allergies.  Home Medications   Current Outpatient Rx  Name  Route  Sig  Dispense  Refill  . amLODipine (NORVASC) 5 MG tablet   Oral   Take 5 mg by mouth every morning.          Marland Kitchen aspirin EC 81 MG tablet   Oral   Take 81 mg by mouth at bedtime.          . Calcium Carbonate (CALTRATE 600 PO)   Oral   Take 300-600 tablets by mouth 2 (two) times daily.          . cyanocobalamin (,VITAMIN B-12,) 1000 MCG/ML injection   Intramuscular  Inject 1,000 mcg into the muscle every 30 (thirty) days.         . fish oil-omega-3 fatty acids 1000 MG capsule   Oral   Take 1 g by mouth daily.         . hydrochlorothiazide (HYDRODIURIL) 25 MG tablet   Oral   Take 25 mg by mouth every morning.          Marland Kitchen. levothyroxine (SYNTHROID, LEVOTHROID) 100 MCG tablet   Oral   Take 100 mcg by mouth every morning.          Marland Kitchen. lisinopril (PRINIVIL,ZESTRIL) 2.5 MG tablet   Oral   Take 2.5 mg by mouth at bedtime.          . metFORMIN (GLUCOPHAGE-XR) 500 MG 24 hr tablet   Oral   Take 500 mg by mouth 2 (two) times daily.         . Multiple Vitamin (MULTIVITAMIN WITH MINERALS) TABS   Oral   Take 1 tablet by mouth daily.         . potassium chloride SA (K-DUR,KLOR-CON) 20 MEQ tablet   Oral   Take 30 mEq by mouth daily.            BP 140/78  Pulse 93  Temp(Src) 98.8 F (37.1 C) (Oral)  Resp 18  Ht 5\' 3"  (1.6 m)  Wt 160 lb (72.576 kg)  BMI 28.35 kg/m2  SpO2 93% Physical Exam  Nursing note and vitals reviewed. Constitutional: She is oriented to person, place, and time. No distress.  Elderly, ill-appearing but nontoxic  HENT:  Head: Normocephalic and atraumatic.  Mucous membranes dry  Eyes: Pupils are equal, round, and reactive to light.  Neck: Neck supple. JVD present.  Cardiovascular: Normal rate, regular rhythm and normal heart sounds.   No murmur heard. Pulmonary/Chest: Effort normal and breath sounds normal. No respiratory distress. She has no wheezes.  Abdominal: Soft. Bowel sounds are normal. There is no rebound and no guarding.  EPigastric tenderness  Musculoskeletal: She exhibits no edema.  Neurological: She is alert and oriented to person, place, and time.  Skin: Skin is warm and dry.  Psychiatric: She has a normal mood and affect.    ED Course  Procedures (including critical care time) Labs Review Labs Reviewed  COMPREHENSIVE METABOLIC PANEL - Abnormal; Notable for the following:    Potassium 3.6 (*)    Glucose, Bld 129 (*)    GFR calc non Af Amer 83 (*)    All other components within normal limits  URINALYSIS, ROUTINE W REFLEX MICROSCOPIC - Abnormal; Notable for the following:    Hgb urine dipstick TRACE (*)    All other components within normal limits  CBC WITH DIFFERENTIAL  LIPASE, BLOOD  URINE MICROSCOPIC-ADD ON   Imaging Review Ct Abdomen Pelvis W Contrast  03/29/2013   CLINICAL DATA:  Generalized abdominal pain and diarrhea.  EXAM: CT ABDOMEN AND PELVIS WITH CONTRAST  TECHNIQUE: Multidetector CT imaging of the abdomen and pelvis was performed using the standard protocol following bolus administration of intravenous contrast.  CONTRAST:  50mL OMNIPAQUE IOHEXOL 300 MG/ML SOLN, 100mL OMNIPAQUE IOHEXOL 300 MG/ML SOLN  COMPARISON:  CT of the abdomen and pelvis 01/27/2012.  FINDINGS:  Lung Bases: Linear opacity in the right lower lobe may reflect an area of scarring or subsegmental atelectasis. Partially calcified lymph node in the the anterior to the heart incidentally noted.  Abdomen/Pelvis: Well-defined 2.7 x 3.2 cm low-attenuation lesion in the central aspect of the  right lobe of the liver it is slightly smaller than the prior study, likely to represent a partially involuted cyst. No new hepatic lesions are otherwise noted. Status post cholecystectomy. Common bile duct appear smaller than the prior study, currently within normal limits for the patient's age and post cholecystectomy status measuring 7 mm. No intrahepatic biliary ductal dilatation. Pancreatic duct is not dilated. The appearance of the pancreas, spleen, bilateral adrenal glands and bilateral kidneys is unremarkable. No significant volume of ascites. No pneumoperitoneum. No pathologic distention of small bowel. No definite lymphadenopathy identified within the abdomen or pelvis. Small umbilical hernia containing only omental fat incidentally noted. Status post hysterectomy. Ovaries are not confidently identified may be surgically absent or atrophic.  Musculoskeletal: There are no aggressive appearing lytic or blastic lesions noted in the visualized portions of the skeleton.  IMPRESSION: 1. No acute findings in the abdomen or pelvis to account for the patient's symptoms. 2. Status post hysterectomy and cholecystectomy. 3. Resolution of previously noted intra and extrahepatic biliary ductal dilatation. 4. Small umbilical hernia containing only omental fat.   Electronically Signed   By: Vinnie Langton M.D.   On: 03/29/2013 17:08    EKG Interpretation   None       MDM   Final diagnoses:  Abdominal pain  Diarrhea    Patient presents with abdominal pain and diarrhea. She feels this is consistent with her prior episodes of pancreatitis. Basic labwork was obtained and is largely unremarkable. CT scan of the abdomen does  not show any abnormality that would explain the patient's pain. Patient reports improvement of pain with pain medication and fluids. She may have a viral gastroenteritis. Discussed dehydration precautions with patient. She's to followup with primary care physician.  After history, exam, and medical workup I feel the patient has been appropriately medically screened and is safe for discharge home. Pertinent diagnoses were discussed with the patient. Patient was given return precautions.     Merryl Hacker, MD 03/29/13 [phone]

## 2013-03-29 NOTE — Discharge Instructions (Signed)
Abdominal Pain, Adult You were evaluated for abdominal pain and diarrhea.  You likely have a GI virus.  All labwork and imaging is reassuring.  You should follow-up with your PCP in 1-2 days if symptoms persist.  Many things can cause abdominal pain. Usually, abdominal pain is not caused by a disease and will improve without treatment. It can often be observed and treated at home. Your health care provider will do a physical exam and possibly order blood tests and X-rays to help determine the seriousness of your pain. However, in many cases, more time must pass before a clear cause of the pain can be found. Before that point, your health care provider may not know if you need more testing or further treatment. HOME CARE INSTRUCTIONS  Monitor your abdominal pain for any changes. The following actions may help to alleviate any discomfort you are experiencing:  Only take over-the-counter or prescription medicines as directed by your health care provider.  Do not take laxatives unless directed to do so by your health care provider.  Try a clear liquid diet (broth, tea, or water) as directed by your health care provider. Slowly move to a bland diet as tolerated. SEEK MEDICAL CARE IF:  You have unexplained abdominal pain.  You have abdominal pain associated with nausea or diarrhea.  You have pain when you urinate or have a bowel movement.  You experience abdominal pain that wakes you in the night.  You have abdominal pain that is worsened or improved by eating food.  You have abdominal pain that is worsened with eating fatty foods. SEEK IMMEDIATE MEDICAL CARE IF:   Your pain does not go away within 2 hours.  You have a fever.  You keep throwing up (vomiting).  Your pain is felt only in portions of the abdomen, such as the right side or the left lower portion of the abdomen.  You pass bloody or black tarry stools. MAKE SURE YOU:  Understand these instructions.   Will watch your  condition.   Will get help right away if you are not doing well or get worse.  Document Released: 11/06/2004 Document Revised: 11/17/2012 Document Reviewed: 10/06/2012 [facility] Patient Information 2014 [place].

## 2013-03-29 NOTE — ED Notes (Signed)
Pt given meal tray prior to d/c. Pt tolerated well.

## 2013-06-29 ENCOUNTER — Other Ambulatory Visit (HOSPITAL_COMMUNITY): Payer: Self-pay | Admitting: Family Medicine

## 2013-06-29 DIAGNOSIS — Z1231 Encounter for screening mammogram for malignant neoplasm of breast: Secondary | ICD-10-CM

## 2013-07-28 ENCOUNTER — Ambulatory Visit (HOSPITAL_COMMUNITY): Payer: Medicare Other

## 2013-08-10 ENCOUNTER — Emergency Department (HOSPITAL_COMMUNITY)
Admission: EM | Admit: 2013-08-10 | Discharge: 2013-08-10 | Disposition: A | Discharge status: Home / Self Care | Payer: Medicare Other | Attending: Emergency Medicine | Admitting: Emergency Medicine

## 2013-08-10 ENCOUNTER — Encounter (HOSPITAL_COMMUNITY): Payer: Self-pay | Admitting: Emergency Medicine

## 2013-08-10 DIAGNOSIS — I10 Essential (primary) hypertension: Secondary | ICD-10-CM | POA: Insufficient documentation

## 2013-08-10 DIAGNOSIS — Z8719 Personal history of other diseases of the digestive system: Secondary | ICD-10-CM | POA: Insufficient documentation

## 2013-08-10 DIAGNOSIS — Z79899 Other long term (current) drug therapy: Secondary | ICD-10-CM | POA: Insufficient documentation

## 2013-08-10 DIAGNOSIS — R5381 Other malaise: Secondary | ICD-10-CM | POA: Insufficient documentation

## 2013-08-10 DIAGNOSIS — E119 Type 2 diabetes mellitus without complications: Secondary | ICD-10-CM | POA: Insufficient documentation

## 2013-08-10 DIAGNOSIS — R197 Diarrhea, unspecified: Secondary | ICD-10-CM | POA: Insufficient documentation

## 2013-08-10 DIAGNOSIS — R5383 Other fatigue: Secondary | ICD-10-CM

## 2013-08-10 DIAGNOSIS — E039 Hypothyroidism, unspecified: Secondary | ICD-10-CM | POA: Insufficient documentation

## 2013-08-10 DIAGNOSIS — Z9889 Other specified postprocedural states: Secondary | ICD-10-CM | POA: Insufficient documentation

## 2013-08-10 DIAGNOSIS — Z7982 Long term (current) use of aspirin: Secondary | ICD-10-CM | POA: Insufficient documentation

## 2013-08-10 DIAGNOSIS — M129 Arthropathy, unspecified: Secondary | ICD-10-CM | POA: Insufficient documentation

## 2013-08-10 DIAGNOSIS — Z9089 Acquired absence of other organs: Secondary | ICD-10-CM | POA: Insufficient documentation

## 2013-08-10 LAB — CBC WITH DIFFERENTIAL/PLATELET
Basophils Absolute: 0 10*3/uL (ref 0.0–0.1)
Basophils Relative: 1 % (ref 0–1)
Eosinophils Absolute: 0.1 10*3/uL (ref 0.0–0.7)
Eosinophils Relative: 1 % (ref 0–5)
HCT: 39.7 % (ref 36.0–46.0)
Hemoglobin: 13.6 g/dL (ref 12.0–15.0)
Lymphocytes Relative: 25 % (ref 12–46)
Lymphs Abs: 1.6 10*3/uL (ref 0.7–4.0)
MCH: 29.4 pg (ref 26.0–34.0)
MCHC: 34.3 g/dL (ref 30.0–36.0)
MCV: 85.9 fL (ref 78.0–100.0)
Monocytes Absolute: 0.5 10*3/uL (ref 0.1–1.0)
Monocytes Relative: 8 % (ref 3–12)
Neutro Abs: 4.2 10*3/uL (ref 1.7–7.7)
Neutrophils Relative %: 65 % (ref 43–77)
Platelets: 206 10*3/uL (ref 150–400)
RBC: 4.62 MIL/uL (ref 3.87–5.11)
RDW: 13.1 % (ref 11.5–15.5)
WBC: 6.4 10*3/uL (ref 4.0–10.5)

## 2013-08-10 LAB — COMPREHENSIVE METABOLIC PANEL
ALT: 14 U/L (ref 0–35)
AST: 18 U/L (ref 0–37)
Albumin: 4.1 g/dL (ref 3.5–5.2)
Alkaline Phosphatase: 69 U/L (ref 39–117)
Anion gap: 13 (ref 5–15)
BUN: 11 mg/dL (ref 6–23)
CO2: 26 mEq/L (ref 19–32)
Calcium: 9.5 mg/dL (ref 8.4–10.5)
Chloride: 96 mEq/L (ref 96–112)
Creatinine, Ser: 0.52 mg/dL (ref 0.50–1.10)
GFR calc Af Amer: >90 mL/min (ref >90)
GFR calc non Af Amer: 89 mL/min — ABNORMAL LOW (ref >90)
Glucose, Bld: 98 mg/dL (ref 70–99)
Potassium: 3.7 mEq/L (ref 3.7–5.3)
Sodium: 135 mEq/L — ABNORMAL LOW (ref 137–147)
Total Bilirubin: 0.4 mg/dL (ref 0.3–1.2)
Total Protein: 7.4 g/dL (ref 6.0–8.3)

## 2013-08-10 MED ORDER — SODIUM CHLORIDE 0.9 % IV BOLUS (SEPSIS)
1000.0000 mL | Freq: Once | INTRAVENOUS | Status: AC
  Administered 2013-08-10: 1000 mL via INTRAVENOUS

## 2013-08-10 NOTE — Discharge Instructions (Signed)
Diarrhea  Diarrhea is frequent loose and watery bowel movements. It can cause you to feel weak and dehydrated. Dehydration can cause you to become tired and thirsty, have a dry mouth, and have decreased urination that often is dark yellow. Diarrhea is a sign of another problem, most often an infection that will not last long. In most cases, diarrhea typically lasts 2-3 days. However, it can last longer if it is a sign of something more serious. It is important to treat your diarrhea as directed by your caregive to lessen or prevent future episodes of diarrhea.  CAUSES   Some common causes include:   Gastrointestinal infections caused by viruses, bacteria, or parasites.   Food poisoning or food allergies.   Certain medicines, such as antibiotics, chemotherapy, and laxatives.   Artificial sweeteners and fructose.   Digestive disorders.  HOME CARE INSTRUCTIONS   Ensure adequate fluid intake (hydration): have 1 cup (8 oz) of fluid for each diarrhea episode. Avoid fluids that contain simple sugars or sports drinks, fruit juices, whole milk products, and sodas. Your urine should be clear or pale yellow if you are drinking enough fluids. Hydrate with an oral rehydration solution that you can purchase at pharmacies, retail stores, and online. You can prepare an oral rehydration solution at home by mixing the following ingredients together:    - tsp table salt.    tsp baking soda.    tsp salt substitute containing potassium chloride.   1  tablespoons sugar.   1 L (34 oz) of water.   Certain foods and beverages may increase the speed at which food moves through the gastrointestinal (GI) tract. These foods and beverages should be avoided and include:   Caffeinated and alcoholic beverages.   High-fiber foods, such as raw fruits and vegetables, nuts, seeds, and whole grain breads and cereals.   Foods and beverages sweetened with sugar alcohols, such as xylitol, sorbitol, and mannitol.   Some foods may be well  tolerated and may help thicken stool including:   Starchy foods, such as rice, toast, pasta, low-sugar cereal, oatmeal, grits, baked potatoes, crackers, and bagels.   Bananas.   Applesauce.   Add probiotic-rich foods to help increase healthy bacteria in the GI tract, such as yogurt and fermented milk products.   Wash your hands well after each diarrhea episode.   Only take over-the-counter or prescription medicines as directed by your caregiver.   Take a warm bath to relieve any burning or pain from frequent diarrhea episodes.  SEEK IMMEDIATE MEDICAL CARE IF:    You are unable to keep fluids down.   You have persistent vomiting.   You have blood in your stool, or your stools are black and tarry.   You do not urinate in 6-8 hours, or there is only a small amount of very dark urine.   You have abdominal pain that increases or localizes.   You have weakness, dizziness, confusion, or lightheadedness.   You have a severe headache.   Your diarrhea gets worse or does not get better.   You have a fever or persistent symptoms for more than 2-3 days.   You have a fever and your symptoms suddenly get worse.  MAKE SURE YOU:    Understand these instructions.   Will watch your condition.   Will get help right away if you are not doing well or get worse.  Document Released: 01/17/2002 Document Revised: 01/14/2012 Document Reviewed: 10/05/2011  [employer] Patient Information 2015 [employer]. This information   is not intended to replace advice given to you by your health care provider. Make sure you discuss any questions you have with your health care provider.

## 2013-08-10 NOTE — ED Notes (Signed)
Per pt, has had diarrhea for 2 months.  Pt states she went to MD on Friday and she was told that the pancreas wasn't making enough enzymes to get the fat out of her body.  Placed on meds.  No improvement.  Pt now with weakness and leg pain for past 3 days.

## 2013-08-11 NOTE — ED Provider Notes (Signed)
CSN: [id number]     Arrival date & time 08/10/13  1109 History   First MD Initiated Contact with Patient 08/10/13 1145     Chief Complaint  Patient presents with  . Diarrhea  . Weakness     (Consider location/radiation/quality/duration/timing/severity/associated sxs/prior Treatment) HPI  81yf with diarrhea. Intermittent for past two months. Has tried imodium. "It will plug me up for days" though. Reports PCP has done stool studies. Saw GI recently and given trial of Creon. Reports hasn't seemed to help. No fever or chills. No blood in stool. No melena. No sick contacts. On abx, but not sure of what specifically, for bronchitis about a week ago. No weight loss. Feels fatigued.   Past Medical History  Diagnosis Date  . Hypertension   . High cholesterol   . Diabetes mellitus without complication     "borderline"  . Hypothyroidism   . Diverticulosis of colon   . Arthritis   . Pancreatitis    Past Surgical History  Procedure Laterality Date  . Appendectomy    . Vein surgery    . Cholecystectomy  1999    cholelithiasis  . Bladder tac    . Abdominal hysterectomy      partial  . Excision of breast biopsy    . Colonoscopy      Dr Reesa Chew cannot remember  . Colonoscopy      Dr Deatra Ina- diverticulosis, pt cannot remember dates  . Esophagogastroduodenoscopy      Dr Damaris Hippo cannot remember  . Eus N/A 03/24/2012    Procedure: ESOPHAGEAL ENDOSCOPIC ULTRASOUND (EUS) RADIAL;  Surgeon: Arta Silence, MD;  Location: [facility];  Service: Endoscopy;  Laterality: N/A;   Family History  Problem Relation Age of Onset  . Diabetes Father   . Diabetes Mother   . Cancer Mother     ovarian  . Cancer Sister 52    breast  . Cancer Sister 40    bone   History  Substance Use Topics  . Smoking status: Never Smoker   . Smokeless tobacco: Never Used  . Alcohol Use: No   OB History   Grav Para Term Preterm Abortions TAB SAB Ect Mult Living   2 2 2       1      Review of  Systems  All systems reviewed and negative, other than as noted in HPI.   Allergies  Clindamycin/lincomycin  Home Medications   Prior to Admission medications   Medication Sig Start Date End Date Taking? Authorizing Provider  amLODipine (NORVASC) 5 MG tablet Take 5 mg by mouth every morning.    Yes Historical Provider, MD  aspirin EC 81 MG tablet Take 81 mg by mouth at bedtime.    Yes Historical Provider, MD  Calcium Carbonate (CALTRATE 600 PO) Take 300-600 tablets by mouth 2 (two) times daily.    Yes Historical Provider, MD  hydrochlorothiazide (HYDRODIURIL) 25 MG tablet Take 25 mg by mouth every morning.    Yes Historical Provider, MD  levothyroxine (SYNTHROID, LEVOTHROID) 100 MCG tablet Take 100 mcg by mouth every morning.    Yes Historical Provider, MD  lisinopril (PRINIVIL,ZESTRIL) 2.5 MG tablet Take 2.5 mg by mouth daily.    Yes Historical Provider, MD  metFORMIN (GLUCOPHAGE-XR) 500 MG 24 hr tablet Take 500 mg by mouth 2 (two) times daily.   Yes Historical Provider, MD  potassium chloride SA (K-DUR,KLOR-CON) 20 MEQ tablet Take 30 mEq by mouth daily.    Yes Historical Provider, MD  BP 132/55  Pulse 74  Temp(Src) 97.6 F (36.4 C) (Oral)  Resp 18  Ht 4\' 11"  (1.499 m)  Wt 154 lb (69.854 kg)  BMI 31.09 kg/m2  SpO2 96% Physical Exam  Nursing note and vitals reviewed. Constitutional: She appears well-developed and well-nourished. No distress.  HENT:  Head: Normocephalic and atraumatic.  Eyes: Conjunctivae are normal. Right eye exhibits no discharge. Left eye exhibits no discharge.  Neck: Neck supple.  Cardiovascular: Normal rate, regular rhythm and normal heart sounds.  Exam reveals no gallop and no friction rub.   No murmur heard. Pulmonary/Chest: Effort normal and breath sounds normal. No respiratory distress.  Abdominal: Soft. She exhibits no distension. There is no tenderness.  Musculoskeletal: She exhibits no edema and no tenderness.  Neurological: She is alert.  Skin:  Skin is warm and dry.  Psychiatric: She has a normal mood and affect. Her behavior is normal. Thought content normal.    ED Course  Procedures (including critical care time) Labs Review Labs Reviewed  COMPREHENSIVE METABOLIC PANEL - Abnormal; Notable for the following:    Sodium 135 (*)    GFR calc non Af Amer 89 (*)    All other components within normal limits  CBC WITH DIFFERENTIAL    Imaging Review No results found.   EKG Interpretation None      MDM   Final diagnoses:  Diarrhea    81yF with diarrhea. Frustrated with duration. She appears well though. Reports PCP has done stool studies and has seen GI as well. Reports was on abx about a week ago for "bronchitis." May be contributing to symptoms currently, but doesn't explain prior to this. For now encouraged to try probiotics. PRN anti motility agents. Return precautions discussed.    Virgel Manifold, MD 08/11/13 [phone]

## 2013-09-06 ENCOUNTER — Ambulatory Visit: Payer: Medicare Other | Admitting: Gastroenterology

## 2013-09-08 ENCOUNTER — Telehealth: Payer: Self-pay | Admitting: *Deleted

## 2013-09-08 NOTE — Telephone Encounter (Signed)
Tiara called from [facility] per Tiara she was checking on patient's referral and pt had a appt 09/06/13 with SF and canceled, Tiara called the patient then called our office back to cancel the referral.

## 2013-10-24 ENCOUNTER — Ambulatory Visit (HOSPITAL_COMMUNITY)
Admission: RE | Admit: 2013-10-24 | Discharge: 2013-10-24 | Disposition: A | Discharge status: Home / Self Care | Payer: Medicare Other | Source: Ambulatory Visit | Attending: Family Medicine | Admitting: Family Medicine

## 2013-10-24 DIAGNOSIS — Z1231 Encounter for screening mammogram for malignant neoplasm of breast: Secondary | ICD-10-CM | POA: Diagnosis not present

## 2013-11-02 ENCOUNTER — Telehealth: Payer: Self-pay | Admitting: Gastroenterology

## 2013-11-02 NOTE — Telephone Encounter (Signed)
I called pt and she said she has intermittent diarrhea and has already been scheduled an OV tomorrow on 11/03/2013 at 1:30 PM with Neil Crouch, PA.

## 2013-11-02 NOTE — Telephone Encounter (Signed)
Pt called this morning wanting to see SF today or to talk with her or nurse. Pt is having diarrhea and she is weak. I asked how long has this been going on and she couldn't say other than its off and on. She has taken medicine for it, but complains it makes her constipated. Back in July her PCP requested her to have an urgent visit with [place] for persistant diarrhea and the patient cancelled and noted that she was better and would call us if she needed SF again. Patient asked if we could work her in and I told her we don't do work ins or walk ins, but I could send a note to the nurse to see what SF would advise since SF is scheduled out to November. Please call [phone]

## 2013-11-03 ENCOUNTER — Encounter: Payer: Self-pay | Admitting: Gastroenterology

## 2013-11-03 ENCOUNTER — Ambulatory Visit (INDEPENDENT_AMBULATORY_CARE_PROVIDER_SITE_OTHER): Payer: Medicare Other | Admitting: Gastroenterology

## 2013-11-03 VITALS — BP 140/78 | HR 77 | Temp 98.5°F | Ht 64.0 in | Wt 143.0 lb

## 2013-11-03 DIAGNOSIS — R197 Diarrhea, unspecified: Secondary | ICD-10-CM

## 2013-11-03 DIAGNOSIS — K8681 Exocrine pancreatic insufficiency: Secondary | ICD-10-CM | POA: Insufficient documentation

## 2013-11-03 DIAGNOSIS — K861 Other chronic pancreatitis: Secondary | ICD-10-CM | POA: Insufficient documentation

## 2013-11-03 MED ORDER — PANCRELIPASE (LIP-PROT-AMYL) 20000-68000 UNITS PO CPEP: 1.0000 | ORAL_CAPSULE | Freq: Three times a day (TID) | ORAL | Status: DC

## 2013-11-03 NOTE — Progress Notes (Signed)
Primary Care Physician: [name]  Primary Gastroenterologist:  ??? (Dr. Oneida Alar)  Chief Complaint  Patient presents with  . Diarrhea    HPI: Kathleen [name] is a 80 y.o. female here for further evaluation of diarrhea. Patient was last seen in our practice in January 2014. Prior to that time she been hospitalized for pancreatitis, suspected to be related to biliary etiology. Endoscopic ultrasound in January 2014 by Dr. Gwyndolyn Saxon outlaw showed findings most typical of chronic pancreatitis it was felt that she likely passed a common bile duct stone as a cause of her prior acute pancreatitis.  In February of this year she presented to the emergency department with complaints of abdominal pain and diarrhea. CT of the abdomen and pelvis with contrast showed resolution of previously noted intra-and extrahepatic biliary ductal dilation, pancreas appeared unremarkable. LFTs and lipase were normal at that time. She presented to the ER again in July with similar symptoms and again labs unremarkable.  Patient is unclear on dates however she states she saw Dr. Paulita Fujita sometime earlier this year. She states that he thought her diarrhea was related to pancreatic insufficiency. He tried her on a couple of different enzymes but they caused "constipation". While she was on the medication she took them around the clock with all meals as prescribed. She reports having stool studies done by her PCP which were unremarkable. Recently did have to have her Synthroid dose adjusted downward. Currently on Levaquin as of Tuesday for upper respiratory issues? Decrease metformin to once daily to see if that would help of diarrhea as well.  Was doing okay for the past 3 weeks and started having diarrhea on Tuesday. 5-6 loose stools daily. No blood in the stool or melena. Diarrhea started before the Levaquin. Takes Imodium, one as needed. Usually develops constipation quickly on Imodium as well. Feels weak. Husband  states she doesn't eat very much but she feels like her appetite is normal. Denies any vomiting or heartburn. Denies any abdominal pain.  Requesting a colonoscopy. States last one over 8 years ago.   Current Outpatient Prescriptions  Medication Sig Dispense Refill  . amLODipine (NORVASC) 5 MG tablet Take 5 mg by mouth every morning.       Marland Kitchen aspirin EC 81 MG tablet Take 81 mg by mouth at bedtime.       . Calcium Carbonate (CALTRATE 600 PO) Take 300-600 tablets by mouth 2 (two) times daily.       . COSOPT PF 22.3-6.8 MG/ML SOLN       . hydrochlorothiazide (HYDRODIURIL) 25 MG tablet Take 25 mg by mouth every morning.       Marland Kitchen levofloxacin (LEVAQUIN) 250 MG tablet Take 250 mg by mouth daily.       Marland Kitchen levothyroxine (SYNTHROID, LEVOTHROID) 88 MCG tablet 88 mcg daily.      Marland Kitchen lisinopril (PRINIVIL,ZESTRIL) 2.5 MG tablet Take 2.5 mg by mouth daily.       Marland Kitchen loperamide (IMODIUM) 2 MG capsule Take 2 mg by mouth as needed for diarrhea or loose stools.      . metFORMIN (GLUCOPHAGE-XR) 500 MG 24 hr tablet Take 500 mg by mouth daily with breakfast.       . potassium chloride SA (K-DUR,KLOR-CON) 20 MEQ tablet Take 30 mEq by mouth daily.       Marland Kitchen ZIOPTAN 0.0015 % SOLN        No current facility-administered medications for this visit.    Allergies as of 11/03/2013 -  Review Complete 11/03/2013  Allergen Reaction Noted  . Clindamycin/lincomycin Hives 08/10/2013   Past Medical History  Diagnosis Date  . Hypertension   . High cholesterol   . Diabetes mellitus without complication     "borderline"  . Hypothyroidism   . Diverticulosis of colon   . Arthritis   . Pancreatitis    Past Surgical History  Procedure Laterality Date  . Appendectomy    . Vein surgery    . Cholecystectomy  1999    cholelithiasis  . Bladder tac    . Abdominal hysterectomy      partial  . Excision of breast biopsy    . Colonoscopy      Dr Reesa Chew cannot remember  . Colonoscopy      Dr Deatra Ina- diverticulosis, pt cannot  remember dates  . Esophagogastroduodenoscopy      Dr Damaris Hippo cannot remember  . Eus N/A 03/24/2012    Dr. Paulita Fujita: suspected passed CBD stone as cause of acute pancreatitis, suspective chronic pancreatitis.    Family History  Problem Relation Age of Onset  . Diabetes Father   . Diabetes Mother   . Cancer Mother     ovarian  . Cancer Sister 54    breast  . Cancer Sister 52    bone   History   Social History  . Marital Status: Married    Spouse Name: N/A    Number of Children: 1  . Years of Education: N/A   Occupational History  . retired, tobacco field    Social History Main Topics  . Smoking status: Never Smoker   . Smokeless tobacco: Never Used  . Alcohol Use: No  . Drug Use: No  . Sexual Activity: None   Other Topics Concern  . None   Social History Narrative   Lost 1 son age 35-?MI   1 living son          ROS:  General: see hpi. Weight of 175 01/2012, down to 143 today. Down 15 pounds since 03/2013.  ENT: Negative for hoarseness, difficulty swallowing , nasal congestion. CV: Negative for chest pain, angina, palpitations, dyspnea on exertion, peripheral edema.  Respiratory: Negative for dyspnea at rest, dyspnea on exertion, cough, sputum, wheezing.  GI: See history of present illness. GU:  Negative for dysuria, hematuria, urinary incontinence, urinary frequency, nocturnal urination.  Endo: see general.  Physical Examination:   BP 140/78  Pulse 77  Temp(Src) 98.5 F (36.9 C) (Oral)  Ht 5\' 4"  (1.626 m)  Wt 143 lb (64.864 kg)  BMI 24.53 kg/m2  General: Well-nourished, well-developed in no acute distress. Accompanied by spouse Eyes: No icterus. Mouth: Oropharyngeal mucosa moist and pink , no lesions erythema or exudate. Lungs: Clear to auscultation bilaterally.  Heart: Regular rate and rhythm, no murmurs rubs or gallops.  Abdomen: Bowel sounds are normal, nontender, nondistended, no hepatosplenomegaly or masses, no abdominal bruits, no rebound or  guarding.  Small umbilical hernia, easily reducible, nontender. Extremities: No lower extremity edema. No clubbing or deformities. Neuro: Alert and oriented x 4   Skin: Warm and dry, no jaundice.   Psych: Alert and cooperative, normal mood and affect.  Labs:  Lab Results  Component Value Date   WBC 6.4 08/10/2013   HGB 13.6 08/10/2013   HCT 39.7 08/10/2013   MCV 85.9 08/10/2013   PLT 206 08/10/2013   Lab Results  Component Value Date   CREATININE 0.52 08/10/2013   BUN 11 08/10/2013   NA 135* 08/10/2013  K 3.7 08/10/2013   CL 96 08/10/2013   CO2 26 08/10/2013   Lab Results  Component Value Date   ALT 14 08/10/2013   AST 18 08/10/2013   ALKPHOS 69 08/10/2013   BILITOT 0.4 08/10/2013   Lab Results  Component Value Date   LIPASE 54 03/29/2013    Imaging Studies: Mm Digital Screening Bilateral  10/24/2013   CLINICAL DATA:  Screening.  EXAM: DIGITAL SCREENING BILATERAL MAMMOGRAM WITH CAD  COMPARISON:  Previous exam(s).  ACR Breast Density Category b: There are scattered areas of fibroglandular density.  FINDINGS: There are no findings suspicious for malignancy. Images were processed with CAD.  IMPRESSION: No mammographic evidence of malignancy. A result letter of this screening mammogram will be mailed directly to the patient.  RECOMMENDATION: Screening mammogram in one year. (Code:SM-B-01Y)  BI-RADS CATEGORY  1: Negative.   Electronically Signed   By: Lillia Mountain M.D.   On: 10/24/2013 15:13

## 2013-11-03 NOTE — Patient Instructions (Signed)
1. I have requested records from your family doctor. I will send you home with stool containers for your next episode of diarrhea. 2. Start Zenpep one capsule up to three times daily before a meal. You may titrate down to once daily if seems to constipate you. Use this instead of Imodium right now. 3. Eat 5-6 small meals daily, avoid fatty or fried foods as much as possible as this will make your diarrhea worse.   Low-Fat Diet for Pancreatitis or Gallbladder Conditions A low-fat diet can be helpful if you have pancreatitis or a gallbladder condition. With these conditions, your pancreas and gallbladder have trouble digesting fats. A healthy eating plan with less fat will help rest your pancreas and gallbladder and reduce your symptoms. WHAT DO I NEED TO KNOW ABOUT THIS DIET?  Eat a low-fat diet.  Reduce your fat intake to less than 20-30% of your total daily calories. This is less than 50-60 g of fat per day.  Remember that you need some fat in your diet. Ask your dietician what your daily goal should be.  Choose nonfat and low-fat healthy foods. Look for the words "nonfat," "low fat," or "fat free."  As a guide, look on the label and choose foods with less than 3 g of fat per serving. Eat only one serving.  Avoid alcohol.  Do not smoke. If you need help quitting, talk with your health care provider.  Eat small frequent meals instead of three large heavy meals. WHAT FOODS CAN I EAT? Grains Include healthy grains and starches such as potatoes, wheat bread, fiber-rich cereal, and brown rice. Choose whole grain options whenever possible. In adults, whole grains should account for 45-65% of your daily calories.  Fruits and Vegetables Eat plenty of fruits and vegetables. Fresh fruits and vegetables add fiber to your diet. Meats and Other Protein Sources Eat lean meat such as chicken and pork. Trim any fat off of meat before cooking it. Eggs, fish, and beans are other sources of protein. In  adults, these foods should account for 10-35% of your daily calories. Dairy Choose low-fat milk and dairy options. Dairy includes fat and protein, as well as calcium.  Fats and Oils Limit high-fat foods such as fried foods, sweets, baked goods, sugary drinks.  Other Creamy sauces and condiments, such as mayonnaise, can add extra fat. Think about whether or not you need to use them, or use smaller amounts or low fat options. WHAT FOODS ARE NOT RECOMMENDED?  High fat foods, such as:  [employer].  Ice cream.  [place] toast.  Sweet rolls.  Pizza.  Cheese bread.  Foods covered with batter, butter, creamy sauces, or cheese.  Fried foods.  Sugary drinks and desserts.  Foods that cause gas or bloating Document Released: 02/01/2013 Document Reviewed: 02/01/2013 [facility] Patient Information 2015 [place], [place]. This information is not intended to replace advice given to you by your health care provider. Make sure you discuss any questions you have with your health care provider.

## 2013-11-03 NOTE — Assessment & Plan Note (Addendum)
80 year old lady who presents with complaints of chronic, intermittent diarrhea, weakness, weight loss. History of likely chronic pancreatitis based on previous endoscopic ultrasound findings in 2014. Patient reports constipation related to pancreatic enzymes. She has lost 30 pounds in the past 2 years. 15 pounds since February of this year. She reports unremarkable stool studies. I have requested records from her PCP as well as prior colonoscopies and recent evaluation by Dr. Paulita Fujita for review. Most concerning is her weight loss. CT of abdomen and pelvis in February with contrast showed no evidence of pancreatic tumor.  Review records.  Cdiff PCR, Giardia EIA. Low-dose Zenpep: 20,000 with meals TID. Samples provided. Hold on imodium. Further recommendations to follow.

## 2013-11-07 ENCOUNTER — Other Ambulatory Visit: Payer: Self-pay | Admitting: Gastroenterology

## 2013-11-08 LAB — GIARDIA/CRYPTOSPORIDIUM (EIA)
Cryptosporidium Screen (EIA): NEGATIVE
GIARDIA SCREEN (EIA): NEGATIVE

## 2013-11-08 LAB — CLOSTRIDIUM DIFFICILE BY PCR: Toxigenic C. Difficile by PCR: NOT DETECTED

## 2013-11-09 NOTE — Progress Notes (Signed)
Quick Note:  Stool studies negative. Review Dr. Erlinda Hong note, suspected malabsorption from chronic pancreatitis. Reviewed labs from June 2015, PCP. Stool culture, O&P, I. FOBT all negative. Lipase minimally elevated at 63 (normal up to 59), TSH 0.38 low, LFTs normal there  I am still waiting on last colonoscopy report from either Dr. Loletha Grayer or Dr. Erskine Emery. Please request again. ______

## 2013-11-09 NOTE — Progress Notes (Signed)
Quick Note:  Forwarding to [place]. ______

## 2013-11-09 NOTE — Progress Notes (Signed)
I requested medical records (last colonoscopy/path) from Dr Kaplan/Dr Nichola Sizer office ASAP

## 2013-11-11 NOTE — Progress Notes (Signed)
cc'ed to pcp °

## 2013-11-16 ENCOUNTER — Encounter: Payer: Self-pay | Admitting: Gastroenterology

## 2013-11-16 NOTE — Progress Notes (Signed)
Quick Note:  Reviewed records.  Colonoscopy, 12 2008, Dr. Delfin Edis: Normal exam. Colonoscopy, November 2002, Dr. Erskine Emery: Diverticulosis of the sigmoid colon.  Please see how patient is doing with regards to diarrhea.  If persistent, then would offer her a colonoscopy. Otherwise, she is not due until 2018. ______

## 2013-11-22 NOTE — Progress Notes (Signed)
Quick Note:  OK. Noted. Offer her f/u with [facility] in 2-3 weeks after being on Zenpep. ______

## 2013-11-22 NOTE — Progress Notes (Signed)
Quick Note:  I called pt. She said the diarrhea is better but she still has it some. She has about 3 episodes on her worst day and yesterday she did not have a BM. She does not want a colonoscopy unless absolutely necessary.  She said her Zenpep was supposed to have been sent to [facility]. I checked and told her that it looks like it went to [facility]. She will call them and see if they have it and if not she will call back to have it sent [facility] [place]. ______

## 2013-11-22 NOTE — Progress Notes (Signed)
Quick Note:  Tried to call pt and line was busy. ______

## 2013-11-22 NOTE — Progress Notes (Signed)
Quick Note:  Pt called. She is aware Magda Paganini would like her to see Dr. Oneida Alar in 2-3 weeks.  She also said the prescription did not go to the mail order and to please [facility] [place]. ______

## 2013-11-23 NOTE — Addendum Note (Signed)
Addended by: Orvil Feil on: 11/23/2013 04:18 PM   Modules accepted: Orders

## 2013-11-24 ENCOUNTER — Telehealth: Payer: Self-pay | Admitting: Gastroenterology

## 2013-11-24 ENCOUNTER — Other Ambulatory Visit: Payer: Self-pay | Admitting: Gastroenterology

## 2013-11-24 MED ORDER — PANCRELIPASE (LIP-PROT-AMYL) 20000-68000 UNITS PO CPEP: 1.0000 | ORAL_CAPSULE | Freq: Three times a day (TID) | ORAL | Status: DC

## 2013-11-24 NOTE — Progress Notes (Signed)
Quick Note:  Pt aware that [facility]. ______

## 2013-11-24 NOTE — Progress Notes (Signed)
Quick Note:  I completed it. ______

## 2013-11-24 NOTE — Telephone Encounter (Signed)
[facility] that [facility] not [facility] yet .  [phone]

## 2013-11-24 NOTE — Progress Notes (Signed)
Quick Note:  Sending to [place] to schedule appt. ______

## 2013-11-25 ENCOUNTER — Telehealth: Payer: Self-pay

## 2013-11-25 NOTE — Telephone Encounter (Signed)
[facility] called and the prescription for the Zenpep did not go through so I gave the verbal order for #90 with 5 refills.

## 2013-11-25 NOTE — Telephone Encounter (Signed)
See separate note.

## 2013-11-28 NOTE — Progress Notes (Signed)
REVIEWED.  

## 2013-12-12 ENCOUNTER — Encounter: Payer: Self-pay | Admitting: Gastroenterology

## 2013-12-15 ENCOUNTER — Encounter: Payer: Self-pay | Admitting: Gastroenterology

## 2013-12-22 ENCOUNTER — Other Ambulatory Visit: Payer: Self-pay | Admitting: Family Medicine

## 2013-12-22 DIAGNOSIS — L03115 Cellulitis of right lower limb: Secondary | ICD-10-CM

## 2013-12-22 DIAGNOSIS — M7989 Other specified soft tissue disorders: Secondary | ICD-10-CM

## 2013-12-22 DIAGNOSIS — M79661 Pain in right lower leg: Secondary | ICD-10-CM

## 2013-12-23 ENCOUNTER — Ambulatory Visit
Admission: RE | Admit: 2013-12-23 | Discharge: 2013-12-23 | Disposition: A | Discharge status: Home / Self Care | Payer: Medicare Other | Source: Ambulatory Visit | Attending: Family Medicine | Admitting: Family Medicine

## 2013-12-23 DIAGNOSIS — L03115 Cellulitis of right lower limb: Secondary | ICD-10-CM

## 2013-12-23 DIAGNOSIS — M79661 Pain in right lower leg: Secondary | ICD-10-CM

## 2013-12-23 DIAGNOSIS — M7989 Other specified soft tissue disorders: Secondary | ICD-10-CM

## 2014-02-01 ENCOUNTER — Encounter: Payer: Self-pay | Admitting: Gastroenterology

## 2014-02-01 ENCOUNTER — Ambulatory Visit (INDEPENDENT_AMBULATORY_CARE_PROVIDER_SITE_OTHER): Payer: Medicare Other | Admitting: Gastroenterology

## 2014-02-01 VITALS — BP 138/73 | HR 68 | Temp 97.0°F | Ht 62.0 in | Wt 138.0 lb

## 2014-02-01 DIAGNOSIS — R197 Diarrhea, unspecified: Secondary | ICD-10-CM

## 2014-02-01 DIAGNOSIS — K861 Other chronic pancreatitis: Secondary | ICD-10-CM

## 2014-02-01 MED ORDER — PANCRELIPASE (LIP-PROT-AMYL) 20000-68000 UNITS PO CPEP
1.0000 | ORAL_CAPSULE | Freq: Three times a day (TID) | ORAL | Status: DC
Start: 2014-02-01 — End: 2014-12-21

## 2014-02-01 NOTE — Progress Notes (Signed)
ON RECALL LIST  °

## 2014-02-01 NOTE — Progress Notes (Signed)
LEFT LOWER EXTREMITY DRESSING REPLACED. DISTAL TOES PINK AFTER RE-WRAPPING LOWER LEG.

## 2014-02-01 NOTE — Progress Notes (Signed)
Subjective:    Patient ID: KEYARA ENT, female    DOB: [birth year], 80 y.o.   MRN: [id number]  Kathleen [name]  HPI Currently has legs wrapped due to cellulitis(R > L). DIARRHEA IS BETTER ON ZENPEP 20K. PT DENIES FEVER, CHILLS,  nausea, vomiting, abdominal pain, OR heartburn or indigestion.    Past Medical History  Diagnosis Date  . Hypertension   . High cholesterol   . Diabetes mellitus without complication     "borderline"  . Hypothyroidism   . Diverticulosis of colon   . Arthritis   . Pancreatitis    Past Surgical History  Procedure Laterality Date  . Appendectomy    . Vein surgery    . Cholecystectomy  1999    cholelithiasis  . Bladder tac    . Abdominal hysterectomy      partial  . Excision of breast biopsy    . Colonoscopy  01/2007    Dr Olevia Perches: normal  . Colonoscopy  12/2000    Dr Deatra Ina- diverticulosis  . Esophagogastroduodenoscopy      Dr Damaris Hippo cannot remember  . Eus N/A 03/24/2012    Dr. Paulita Fujita: suspected passed CBD stone as cause of acute pancreatitis, suspective chronic pancreatitis.    Allergies  Allergen Reactions  . Clindamycin/Lincomycin Hives  . Creon [Pancreatin] Hives  . Florastor [Yeast] Hives  . Zenpep [Pancrelipase (Lip-Prot-Amyl)] Hives   Current Outpatient Prescriptions  Medication Sig Dispense Refill  . amLODipine (NORVASC) 5 MG tablet Take 5 mg by mouth every morning.     Marland Kitchen aspirin EC 81 MG tablet Take 81 mg by mouth at bedtime.     . Calcium Carbonate (CALTRATE 600 PO) Take 300-600 tablets by mouth 2 (two) times daily.     . carboxymethylcellulose (REFRESH PLUS) 0.5 % SOLN 1 drop 3 (three) times daily as needed.    . COSOPT PF 22.3-6.8 MG/ML SOLN     . cyanocobalamin (,VITAMIN B-12,) 1000 MCG/ML injection Inject 1,000 mcg into the muscle every 30 (thirty) days.    . hydrochlorothiazide (HYDRODIURIL) 25 MG tablet Take 25 mg by mouth every morning.     Marland Kitchen levothyroxine (SYNTHROID, LEVOTHROID) 88 MCG tablet 88 mcg daily.    Marland Kitchen  lisinopril (PRINIVIL,ZESTRIL) 2.5 MG tablet Take 2.5 mg by mouth daily.     . metFORMIN (GLUCOPHAGE-XR) 500 MG 24 hr tablet Take 500 mg by mouth daily with breakfast.     . Omega-3 Fatty Acids (FISH OIL) 500 MG CAPS Take 2 capsules by mouth once.    . Pancrelipase, Lip-Prot-Amyl, (ZENPEP) 20000 UNITS CPEP Take 1 capsule (20,000 Units total) by mouth 3 (three) times daily before meals.    . potassium chloride SA (K-DUR,KLOR-CON) 20 MEQ tablet Take 30 mEq by mouth daily.     Marland Kitchen ZIOPTAN 0.0015 % SOLN     . levofloxacin (LEVAQUIN) 250 MG tablet Take 250 mg by mouth daily.     Marland Kitchen loperamide (IMODIUM) 2 MG capsule Take 2 mg by mouth as needed for diarrhea or loose stools.     Review of Systems     Objective:   Physical Exam  Constitutional: She is oriented to person, place, and time. She appears well-developed and well-nourished. No distress.  HENT:  Head: Normocephalic and atraumatic.  Mouth/Throat: Oropharynx is clear and moist. No oropharyngeal exudate.  Eyes: Pupils are equal, round, and reactive to light. No scleral icterus.  Neck: Normal range of motion. Neck supple.  Cardiovascular: Normal rate, regular rhythm and normal heart  sounds.   Pulmonary/Chest: Effort normal and breath sounds normal. No respiratory distress.  Abdominal: Soft. Bowel sounds are normal. She exhibits no distension. There is no tenderness.  Musculoskeletal: She exhibits edema (RIGHT > LEFT).  BIL LOWER EXTREMITY DRESSED IN GAUZE/ACE BANDAGE(DRY), LOWER LEGS WARM TO TOUCH. NO ERYTHEMA APPRECIATED ABOVE BANDAGE  Lymphadenopathy:    She has no cervical adenopathy.  Neurological: She is alert and oriented to person, place, and time.  Psychiatric: She has a normal mood and affect.  Vitals reviewed.         Assessment & Plan:

## 2014-02-01 NOTE — Assessment & Plan Note (Signed)
WITH SOME EXOCRINE INSUFFICIENCY. NO EVIDENCE FOR AN ACUTE FLARE.  CONTINUE TO MONITOR SYMPTOMS. FOLLOW UP IN 6 MOS.

## 2014-02-01 NOTE — Assessment & Plan Note (Signed)
SX RESOLVED AFTER ZENPEP 20K.  CONTINUE ZENPEP. CONTINUE TO MONITOR SYMPTOMS. FOLLOW UP IN 6 MOS.

## 2014-02-01 NOTE — Patient Instructions (Signed)
CONTINUE ZENPEP DAILY. YOU MAY INCREASE TO UP TO 3 TIMES A DAY AS NEEDED.  FOLLOW UP IN 6 MOS. MERRY CHRISTMAS AND HAPPY NEW YEAR!

## 2014-02-07 NOTE — Progress Notes (Signed)
cc'ed to pcp °

## 2014-02-20 ENCOUNTER — Other Ambulatory Visit: Payer: Self-pay | Admitting: *Deleted

## 2014-02-20 DIAGNOSIS — I83893 Varicose veins of bilateral lower extremities with other complications: Secondary | ICD-10-CM

## 2014-03-13 ENCOUNTER — Encounter: Payer: Self-pay | Admitting: Vascular Surgery

## 2014-03-14 ENCOUNTER — Ambulatory Visit (INDEPENDENT_AMBULATORY_CARE_PROVIDER_SITE_OTHER): Payer: Medicare Other | Admitting: Vascular Surgery

## 2014-03-14 ENCOUNTER — Encounter: Payer: Self-pay | Admitting: Vascular Surgery

## 2014-03-14 ENCOUNTER — Ambulatory Visit (HOSPITAL_COMMUNITY)
Admission: RE | Admit: 2014-03-14 | Discharge: 2014-03-14 | Disposition: A | Discharge status: Home / Self Care | Payer: Medicare Other | Source: Ambulatory Visit | Attending: Vascular Surgery | Admitting: Vascular Surgery

## 2014-03-14 VITALS — BP 169/59 | HR 64 | Resp 18 | Ht 63.0 in | Wt 140.0 lb

## 2014-03-14 DIAGNOSIS — I83893 Varicose veins of bilateral lower extremities with other complications: Secondary | ICD-10-CM | POA: Insufficient documentation

## 2014-03-14 DIAGNOSIS — I83891 Varicose veins of right lower extremities with other complications: Secondary | ICD-10-CM

## 2014-03-14 NOTE — Progress Notes (Signed)
Subjective:     Patient ID: Kathleen [name], female   DOB: 1931/08/12, 81 y.o.   MRN: [id number]  HPI this 81 year old female was referred for venous stasis ulcer right leg. Patient had an ulcer for 2-3 months which required Unna boot treatments and eventually healed about the middle of December 2015. She also has had an ulcer in the left leg and a remote history of vein stripping in the left leg 40 or 50 years ago. She has chronic swelling in both legs and has noticed very dark thick scan in the right lower extremity over the last few years. She does not were elastic compression stockings nor elevate her legs on a regular basis nor take pain medication. She states that she was having a lot of pain with the ulcer was active and had not healed. She has no history of DVT or thrombophlebitis.  Past Medical History  Diagnosis Date  . Hypertension   . High cholesterol   . Diabetes mellitus without complication     "borderline"  . Hypothyroidism   . Diverticulosis of colon   . Arthritis   . Pancreatitis     History  Substance Use Topics  . Smoking status: Never Smoker   . Smokeless tobacco: Never Used  . Alcohol Use: No    Family History  Problem Relation Age of Onset  . Diabetes Father   . Diabetes Mother   . Cancer Mother     ovarian  . Cancer Sister 45    breast  . Cancer Sister 27    bone    Allergies  Allergen Reactions  . Clindamycin/Lincomycin Hives  . Creon [Pancreatin] Hives  . Florastor [Yeast] Hives  . Zenpep [Pancrelipase (Lip-Prot-Amyl)] Hives     Current outpatient prescriptions:  .  amLODipine (NORVASC) 5 MG tablet, Take 5 mg by mouth every morning. , Disp: , Rfl:  .  aspirin EC 81 MG tablet, Take 81 mg by mouth at bedtime. , Disp: , Rfl:  .  Calcium Carbonate (CALTRATE 600 PO), Take 300-600 tablets by mouth 2 (two) times daily. , Disp: , Rfl:  .  carboxymethylcellulose (REFRESH PLUS) 0.5 % SOLN, 1 drop 3 (three) times daily as needed., Disp: , Rfl:  .  COSOPT  PF 22.3-6.8 MG/ML SOLN, , Disp: , Rfl:  .  cyanocobalamin (,VITAMIN B-12,) 1000 MCG/ML injection, Inject 1,000 mcg into the muscle every 30 (thirty) days., Disp: , Rfl:  .  hydrochlorothiazide (HYDRODIURIL) 25 MG tablet, Take 25 mg by mouth every morning. , Disp: , Rfl:  .  levofloxacin (LEVAQUIN) 250 MG tablet, Take 250 mg by mouth daily. , Disp: , Rfl:  .  levothyroxine (SYNTHROID, LEVOTHROID) 88 MCG tablet, 88 mcg daily., Disp: , Rfl:  .  lisinopril (PRINIVIL,ZESTRIL) 2.5 MG tablet, Take 2.5 mg by mouth daily. , Disp: , Rfl:  .  loperamide (IMODIUM) 2 MG capsule, Take 2 mg by mouth as needed for diarrhea or loose stools., Disp: , Rfl:  .  metFORMIN (GLUCOPHAGE-XR) 500 MG 24 hr tablet, Take 500 mg by mouth daily with breakfast. , Disp: , Rfl:  .  Omega-3 Fatty Acids (FISH OIL) 500 MG CAPS, Take 2 capsules by mouth once., Disp: , Rfl:  .  Pancrelipase, Lip-Prot-Amyl, (ZENPEP) 20000 UNITS CPEP, Take 1 capsule (20,000 Units total) by mouth 3 (three) times daily before meals., Disp: 90 capsule, Rfl: 11 .  potassium chloride SA (K-DUR,KLOR-CON) 20 MEQ tablet, Take 30 mEq by mouth daily. , Disp: , Rfl:  .  ZIOPTAN 0.0015 % SOLN, , Disp: , Rfl:   BP 169/59 mmHg  Pulse 64  Resp 18  Ht 5\' 3"  (1.6 m)  Wt 140 lb (63.504 kg)  BMI 24.81 kg/m2  Body mass index is 24.81 kg/(m^2).           Review of Systems denies chest pain, dyspnea on exertion, PND, orthopnea, hemoptysis, claudication. Other systems negative and complete review of systems     Objective:   Physical Exam BP 169/59 mmHg  Pulse 64  Resp 18  Ht 5\' 3"  (1.6 m)  Wt 140 lb (63.504 kg)  BMI 24.81 kg/m2  Gen.-alert and oriented x3 in no apparent distress HEENT normal for age Lungs no rhonchi or wheezing Cardiovascular regular rhythm no murmurs carotid pulses 3+ palpable no bruits audible Abdomen soft nontender no palpable masses Musculoskeletal free of  major deformities Skin clear -no rashes Neurologic normal Lower  extremities 3+ femoral and dorsalis pedis pulses palpable bilaterally with 1+ edema bilaterally Right leg with severe hyperpigmentation lower third particularly medially proximal to medial malleolus with evidence of healed ulcer. Bulging varicosities in medial thigh and calf. 3+ dorsalis pedis pulse palpable. Left leg with bulging varicosities in medial thigh and calf which evidence of previous vein stripping. 2+ posterior tibial pulse palpable.  Today I ordered bilateral venous duplex exam which I reviewed and interpreted. The right great saphenous vein has gross reflux particularly in the distal thigh and calf area just proximal to where the ulceration was located. There are also incompetent perforating branches in this area. There is some deep reflux as well. There is no DVT. Left leg has some areas of reflux and some collateral branches but there is no great saphenous vein remaining in the small saphenous has no reflux.  I confirmed the right leg findings with the bedside sono site ultrasound performing an independent exam and she does have a fairly large great saphenous vein in the distal thigh and knee area which is proximal to these ulcerations in this vein has reflux.       Assessment:     Gross reflux right great saphenous vein with recent healing of stasis ulcer right lower leg and severe skin changes-CEA P 5    Plan:         #1 long leg elastic compression stockings 20-30 mm gradient #2 elevate legs as much as possible #3 ibuprofen daily on a regular basis for pain #4 return in 3 months-if no significant improvement then patient will need laser ablation right great saphenous vein to prevent further venous stasis ulcers and stabilize skin which is severely thickened and hyperpigmented at the present time  Patient to return in 3 months

## 2014-06-12 ENCOUNTER — Encounter: Payer: Self-pay | Admitting: Vascular Surgery

## 2014-06-13 ENCOUNTER — Ambulatory Visit (INDEPENDENT_AMBULATORY_CARE_PROVIDER_SITE_OTHER): Payer: Medicare Other | Admitting: Vascular Surgery

## 2014-06-13 ENCOUNTER — Encounter: Payer: Self-pay | Admitting: Vascular Surgery

## 2014-06-13 VITALS — BP 144/82 | HR 78 | Resp 16 | Ht 60.0 in | Wt 142.0 lb

## 2014-06-13 DIAGNOSIS — I83899 Varicose veins of unspecified lower extremities with other complications: Secondary | ICD-10-CM | POA: Insufficient documentation

## 2014-06-13 DIAGNOSIS — I83891 Varicose veins of right lower extremities with other complications: Secondary | ICD-10-CM

## 2014-06-13 NOTE — Progress Notes (Signed)
Filed Vitals:   06/13/14 1004 06/13/14 1005  BP: 166/102 144/82  Pulse: 78 78  Resp: 16 16  Height: 5' (1.524 m)   Weight: 142 lb (64.411 kg)

## 2014-06-13 NOTE — Progress Notes (Signed)
Subjective:     Patient ID: Kathleen [name], female   DOB: 04-12-1931, 81 y.o.   MRN: [id number]  HPI this 81 year old female returns for continued follow-up regarding her severe gross reflux in the right great saphenous vein which has led to severe skin changes with darkness and thickening and recurrent ulceration. The ulcer is currently healed. She has worn long-leg elastic compression stockings and tried ibuprofen over the past 3 once with no improvement in her symptomatology which consists of pain and swelling. She has had history of vein stripping in the contralateral left leg and has had ulcerations on the left side as well. She denies a history of DVT or thrombophlebitis or bleeding.  Past Medical History  Diagnosis Date  . Hypertension   . High cholesterol   . Diabetes mellitus without complication     "borderline"  . Hypothyroidism   . Diverticulosis of colon   . Arthritis   . Pancreatitis     History  Substance Use Topics  . Smoking status: Never Smoker   . Smokeless tobacco: Never Used  . Alcohol Use: No    Family History  Problem Relation Age of Onset  . Diabetes Father   . Diabetes Mother   . Cancer Mother     ovarian  . Cancer Sister 66    breast  . Cancer Sister 44    bone    Allergies  Allergen Reactions  . Clindamycin/Lincomycin Hives  . Creon [Pancreatin] Hives  . Florastor [Yeast] Hives  . Zenpep [Pancrelipase (Lip-Prot-Amyl)] Hives     Current outpatient prescriptions:  .  amLODipine (NORVASC) 5 MG tablet, Take 3 mg by mouth every morning. , Disp: , Rfl:  .  aspirin EC 81 MG tablet, Take 81 mg by mouth at bedtime. , Disp: , Rfl:  .  Calcium Carbonate (CALTRATE 600 PO), Take 300-600 tablets by mouth 2 (two) times daily. , Disp: , Rfl:  .  COSOPT PF 22.3-6.8 MG/ML SOLN, , Disp: , Rfl:  .  cyanocobalamin (,VITAMIN B-12,) 1000 MCG/ML injection, Inject 1,000 mcg into the muscle every 30 (thirty) days., Disp: , Rfl:  .  hydrochlorothiazide (HYDRODIURIL)  25 MG tablet, Take 25 mg by mouth every morning. , Disp: , Rfl:  .  levothyroxine (SYNTHROID, LEVOTHROID) 88 MCG tablet, 88 mcg daily., Disp: , Rfl:  .  lisinopril (PRINIVIL,ZESTRIL) 2.5 MG tablet, Take 2.5 mg by mouth daily. , Disp: , Rfl:  .  loperamide (IMODIUM) 2 MG capsule, Take 2 mg by mouth as needed for diarrhea or loose stools., Disp: , Rfl:  .  metFORMIN (GLUCOPHAGE-XR) 500 MG 24 hr tablet, Take 500 mg by mouth daily with breakfast. , Disp: , Rfl:  .  Omega-3 Fatty Acids (FISH OIL) 500 MG CAPS, Take 2 capsules by mouth once., Disp: , Rfl:  .  Pancrelipase, Lip-Prot-Amyl, (ZENPEP) 20000 UNITS CPEP, Take 1 capsule (20,000 Units total) by mouth 3 (three) times daily before meals., Disp: 90 capsule, Rfl: 11 .  potassium chloride SA (K-DUR,KLOR-CON) 20 MEQ tablet, Take 30 mEq by mouth daily. , Disp: , Rfl:  .  ZIOPTAN 0.0015 % SOLN, , Disp: , Rfl:  .  carboxymethylcellulose (REFRESH PLUS) 0.5 % SOLN, 1 drop 3 (three) times daily as needed., Disp: , Rfl:  .  levofloxacin (LEVAQUIN) 250 MG tablet, Take 250 mg by mouth daily. , Disp: , Rfl:   Filed Vitals:   06/13/14 1004 06/13/14 1005  BP: 166/102 144/82  Pulse: 78 78  Resp: 16  16  Height: 5' (1.524 m)   Weight: 142 lb (64.411 kg)     Body mass index is 27.73 kg/(m^2).           Review of Systems denies chest pain, dyspnea on exertion, PND, orthopnea, hemoptysis    Objective:   Physical Exam BP 144/82 mmHg  Pulse 78  Resp 16  Ht 5' (1.524 m)  Wt 142 lb (64.411 kg)  BMI 27.73 kg/m2  General well-developed elderly female in no apparent distress alert and oriented 3 Lungs no rhonchi or wheezing Kyphoscoliosis Right lower extremity with severe hyperpigmentation lower third of leg medially with chronic edema-1+ but no active ulcer. 3+ dorsalis pedis pulse palpable.  The previous duplex scan reveals gross reflux throughout the right great saphenous vein and today I confirmed this by bedside sono site exam with a large  great saphenous vein from the proximal calf to near the saphenofemoral junction with no DVT     Assessment:     Gross reflux right great saphenous vein with history of recurrent stasis ulcer right ankle-severe skin changes-CEAP 5    Plan:     Patient needs laser ablation right great saphenous vein Will proceed with precertification to perform this in the near future to prevent further stasis ulcers and hopefully stabilize scan which is severely thickened and diseased

## 2014-06-14 ENCOUNTER — Other Ambulatory Visit: Payer: Self-pay | Admitting: *Deleted

## 2014-06-14 DIAGNOSIS — L97219 Non-pressure chronic ulcer of right calf with unspecified severity: Principal | ICD-10-CM

## 2014-06-14 DIAGNOSIS — I83012 Varicose veins of right lower extremity with ulcer of calf: Secondary | ICD-10-CM

## 2014-06-22 ENCOUNTER — Encounter: Payer: Self-pay | Admitting: Gastroenterology

## 2014-07-14 ENCOUNTER — Encounter: Payer: Self-pay | Admitting: Vascular Surgery

## 2014-07-17 ENCOUNTER — Ambulatory Visit (INDEPENDENT_AMBULATORY_CARE_PROVIDER_SITE_OTHER): Payer: Medicare Other | Admitting: Vascular Surgery

## 2014-07-17 ENCOUNTER — Encounter: Payer: Self-pay | Admitting: Vascular Surgery

## 2014-07-17 VITALS — BP 164/82 | HR 82 | Resp 18 | Ht 63.0 in | Wt 140.0 lb

## 2014-07-17 DIAGNOSIS — I83891 Varicose veins of right lower extremities with other complications: Secondary | ICD-10-CM | POA: Diagnosis not present

## 2014-07-17 NOTE — Progress Notes (Signed)
Laser Ablation Procedure    Date: 07/17/2014   ELICA ALMAS DOB:04-29-1931  Consent signed: Yes    Surgeon:  Dr. Nelda Severe. Kellie Simmering  Procedure: Laser Ablation: right Greater Saphenous Vein Dr. Kellie Simmering  BP 164/82 mmHg  Pulse 82  Resp 18  Ht 5\' 3"  (1.6 m)  Wt 140 lb (63.504 kg)  BMI 24.81 kg/m2  Tumescent Anesthesia: 410 cc 0.9% NaCl with 50 cc Lidocaine HCL with 1% Epi and 15 cc 8.4% NaHCO3  Local Anesthesia: 5 cc Lidocaine HCL and NaHCO3 (ratio 2:1)  Pulsed Mode: 15 watts, 51ms delay, 1.0 duration  Total Energy:   1826           Total Pulses:    122            Total Time: 2:02    Patient tolerated procedure well  Notes:   Description of Procedure:  After marking the course of the secondary varicosities, the patient was placed on the operating table in the supine position, and the right leg was prepped and draped in sterile fashion.   Local anesthetic was administered and under ultrasound guidance the saphenous vein was accessed with a micro needle and guide wire; then the mirco puncture sheath was placed.  A guide wire was inserted saphenofemoral junction , followed by a 5 french sheath.  The position of the sheath and then the laser fiber below the junction was confirmed using the ultrasound.  Tumescent anesthesia was administered along the course of the saphenous vein using ultrasound guidance. The patient was placed in Trendelenburg position and protective laser glasses were placed on patient and staff, and the laser was fired at 15 watts continuous mode advancing 1-12mm/second for a total of 1826 joules.    Steri strips were applied to the stab wounds and ABD pads and thigh high compression stockings were applied.  Ace wrap bandages were applied over the phlebectomy sites and at the top of the saphenofemoral junction. Blood loss was less than 15 cc.  The patient ambulated out of the operating room having tolerated the procedure well.

## 2014-07-17 NOTE — Progress Notes (Signed)
Subjective:     Patient ID: Kathleen [name], female   DOB: 04/11/1931, 81 y.o.   MRN: [id number]  HPI this 81 year old female had laser ablation of the right great saphenous vein from the proximal calf to near the saphenofemoral junction performed under local tumescent anesthesia. A total of 1823 J of energy was utilized. She tolerated the procedure well. Review of Systems     Objective:   Physical Exam BP 164/82 mmHg  Pulse 82  Resp 18  Ht 5\' 3"  (1.6 m)  Wt 140 lb (63.504 kg)  BMI 24.81 kg/m2       Assessment:     Well-tolerated laser ablation right great saphenous vein performed under local tumescent anesthesia for severe skin changes with gross reflux right great saphenous vein    Plan:     Return in 1 week for venous duplex exam to confirm closure right great saphenous vein This will conclude patient's treatment regimen

## 2014-07-18 ENCOUNTER — Telehealth: Payer: Self-pay | Admitting: *Deleted

## 2014-07-18 NOTE — Telephone Encounter (Signed)
Pt doing well she says. Not having pain and taking her Ibuprofen. Just had questions about where to place the ice. Reminded her of her fu appt next week.

## 2014-07-19 ENCOUNTER — Encounter: Payer: Self-pay | Admitting: Vascular Surgery

## 2014-07-24 ENCOUNTER — Ambulatory Visit (INDEPENDENT_AMBULATORY_CARE_PROVIDER_SITE_OTHER): Payer: Medicare Other | Admitting: Vascular Surgery

## 2014-07-24 ENCOUNTER — Ambulatory Visit (HOSPITAL_COMMUNITY)
Admission: RE | Admit: 2014-07-24 | Discharge: 2014-07-24 | Disposition: A | Discharge status: Home / Self Care | Payer: Medicare Other | Source: Ambulatory Visit | Attending: Vascular Surgery | Admitting: Vascular Surgery

## 2014-07-24 ENCOUNTER — Encounter: Payer: Self-pay | Admitting: Vascular Surgery

## 2014-07-24 VITALS — BP 144/79 | HR 69 | Resp 16 | Ht 63.0 in | Wt 140.0 lb

## 2014-07-24 DIAGNOSIS — L97219 Non-pressure chronic ulcer of right calf with unspecified severity: Secondary | ICD-10-CM

## 2014-07-24 DIAGNOSIS — I83012 Varicose veins of right lower extremity with ulcer of calf: Secondary | ICD-10-CM | POA: Diagnosis present

## 2014-07-24 DIAGNOSIS — I83891 Varicose veins of right lower extremities with other complications: Secondary | ICD-10-CM

## 2014-07-24 NOTE — Progress Notes (Signed)
Subjective:     Patient ID: Kathleen [name], female   DOB: Oct 14, 1931, 81 y.o.   MRN: [id number]  HPI this 81 year old female returns 1 week post-laser ablation right great saphenous vein for gross reflux with history of recurrent stasis ulcer right leg and severe skin changes. The ulcer had healed preoperatively. She has no evidence of arterial insufficiency. She has had minimal discomfort in the right leg where the ablation was performed. She has seen no change in the distal edema which has been minimal. She has been taking ibuprofen as instructed. She has no specific complaints.  Past Medical History  Diagnosis Date  . Hypertension   . High cholesterol   . Diabetes mellitus without complication     "borderline"  . Hypothyroidism   . Diverticulosis of colon   . Arthritis   . Pancreatitis     History  Substance Use Topics  . Smoking status: Never Smoker   . Smokeless tobacco: Never Used  . Alcohol Use: No    Family History  Problem Relation Age of Onset  . Diabetes Father   . Diabetes Mother   . Cancer Mother     ovarian  . Cancer Sister 40    breast  . Cancer Sister 62    bone    Allergies  Allergen Reactions  . Clindamycin/Lincomycin Hives  . Creon [Pancreatin] Hives  . Florastor [Yeast] Hives  . Zenpep [Pancrelipase (Lip-Prot-Amyl)] Hives     Current outpatient prescriptions:  .  amLODipine (NORVASC) 5 MG tablet, Take 3 mg by mouth every morning. , Disp: , Rfl:  .  aspirin EC 81 MG tablet, Take 81 mg by mouth at bedtime. , Disp: , Rfl:  .  Calcium Carbonate (CALTRATE 600 PO), Take 300-600 tablets by mouth 2 (two) times daily. , Disp: , Rfl:  .  carboxymethylcellulose (REFRESH PLUS) 0.5 % SOLN, 1 drop 3 (three) times daily as needed., Disp: , Rfl:  .  COSOPT PF 22.3-6.8 MG/ML SOLN, , Disp: , Rfl:  .  cyanocobalamin (,VITAMIN B-12,) 1000 MCG/ML injection, Inject 1,000 mcg into the muscle every 30 (thirty) days., Disp: , Rfl:  .  hydrochlorothiazide (HYDRODIURIL) 25  MG tablet, Take 25 mg by mouth every morning. , Disp: , Rfl:  .  levothyroxine (SYNTHROID, LEVOTHROID) 88 MCG tablet, 88 mcg daily., Disp: , Rfl:  .  lisinopril (PRINIVIL,ZESTRIL) 2.5 MG tablet, Take 2.5 mg by mouth daily. , Disp: , Rfl:  .  loperamide (IMODIUM) 2 MG capsule, Take 2 mg by mouth as needed for diarrhea or loose stools., Disp: , Rfl:  .  metFORMIN (GLUCOPHAGE-XR) 500 MG 24 hr tablet, Take 500 mg by mouth daily with breakfast. , Disp: , Rfl:  .  Omega-3 Fatty Acids (FISH OIL) 500 MG CAPS, Take 2 capsules by mouth once., Disp: , Rfl:  .  Pancrelipase, Lip-Prot-Amyl, (ZENPEP) 20000 UNITS CPEP, Take 1 capsule (20,000 Units total) by mouth 3 (three) times daily before meals., Disp: 90 capsule, Rfl: 11 .  potassium chloride SA (K-DUR,KLOR-CON) 20 MEQ tablet, Take 30 mEq by mouth daily. , Disp: , Rfl:  .  ZIOPTAN 0.0015 % SOLN, , Disp: , Rfl:  .  levofloxacin (LEVAQUIN) 250 MG tablet, Take 250 mg by mouth daily. , Disp: , Rfl:   Filed Vitals:   07/24/14 1440 07/24/14 1442  BP: 156/75 144/79  Pulse: 66 69  Resp: 16   Height: 5\' 3"  (1.6 m)   Weight: 140 lb (63.504 kg)  Body mass index is 24.81 kg/(m^2).           Review of Systems denies chest pain, dyspnea or exertion, PND, orthopnea, hemoptysis, claudication.     Objective:   Physical Exam BP 144/79 mmHg  Pulse 69  Resp 16  Ht 5\' 3"  (1.6 m)  Wt 140 lb (63.504 kg)  BMI 24.81 kg/m2  Gen. well-developed well-nourished female in no apparent distress alert and oriented 3 Lungs no rhonchi or wheezing Cardiovascular regular rhythm no murmurs Right leg with moderate ecchymosis over great saphenous system extending from inguinal crease down to medial thigh above the knee. No tenderness to palpation. Minimal distal edema. Chronic hyperpigmentation.  Today I ordered a venous duplex exam of the right leg which I reviewed and interpreted. There is no DVT. His total closure of the great saphenous vein from the knee to the  proximal thigh and there was partial occlusion from that point up to 3.8 cm from the saphenofemoral junction.     Assessment:     Successful laser ablation right great saphenous vein done for gross reflux right great saphenous system with history of recurrent stasis ulcers and severe skin changes    Plan:     Patient will wear long leg elastic stocking for 1 more week and then convert to short-leg stocking Return to see me on when necessary basis

## 2014-07-24 NOTE — Progress Notes (Signed)
Filed Vitals:   07/24/14 1440 07/24/14 1442  BP: 156/75 144/79  Pulse: 66 69  Resp: 16   Height: 5\' 3"  (1.6 m)   Weight: 140 lb (63.504 kg)    Body mass index is 24.81 kg/(m^2).

## 2014-08-30 ENCOUNTER — Ambulatory Visit
Admission: RE | Admit: 2014-08-30 | Discharge: 2014-08-30 | Disposition: A | Discharge status: Home / Self Care | Payer: Medicare Other | Source: Ambulatory Visit | Attending: Allergy | Admitting: Allergy

## 2014-08-30 ENCOUNTER — Other Ambulatory Visit: Payer: Self-pay | Admitting: Allergy

## 2014-08-30 DIAGNOSIS — T783XXA Angioneurotic edema, initial encounter: Secondary | ICD-10-CM

## 2014-11-22 ENCOUNTER — Other Ambulatory Visit: Payer: Self-pay

## 2014-11-22 DIAGNOSIS — Z1231 Encounter for screening mammogram for malignant neoplasm of breast: Secondary | ICD-10-CM

## 2014-12-12 ENCOUNTER — Ambulatory Visit
Admission: RE | Admit: 2014-12-12 | Discharge: 2014-12-12 | Disposition: A | Discharge status: Home / Self Care | Payer: Medicare Other | Source: Ambulatory Visit

## 2014-12-12 DIAGNOSIS — Z1231 Encounter for screening mammogram for malignant neoplasm of breast: Secondary | ICD-10-CM

## 2014-12-21 ENCOUNTER — Ambulatory Visit (INDEPENDENT_AMBULATORY_CARE_PROVIDER_SITE_OTHER): Payer: Medicare Other | Admitting: Gastroenterology

## 2014-12-21 ENCOUNTER — Encounter: Payer: Self-pay | Admitting: Gastroenterology

## 2014-12-21 VITALS — BP 164/74 | HR 56 | Temp 97.3°F | Ht 65.0 in | Wt 148.0 lb

## 2014-12-21 DIAGNOSIS — K861 Other chronic pancreatitis: Secondary | ICD-10-CM

## 2014-12-21 DIAGNOSIS — K8681 Exocrine pancreatic insufficiency: Secondary | ICD-10-CM | POA: Diagnosis not present

## 2014-12-21 MED ORDER — PANCRELIPASE (LIP-PROT-AMYL) 20000-68000 UNITS PO CPEP: 1.0000 | ORAL_CAPSULE | Freq: Three times a day (TID) | ORAL | Status: DC

## 2014-12-21 NOTE — Progress Notes (Signed)
Subjective:    Patient ID: Kathleen [name], female    DOB: 1931/06/23, 81 y.o.   MRN: [id number]  KAPLAN,KRISTEN, PA-C  HPI PILLS REALLY HELPED WITH DIARRHEA. DIARRHEA BEEN GONE SINCE TAKING ZENPEP. SORES IN LEGS AND HAD TO HAVE VEIN STRIPPED IN RIGHT LEG. GOT HIVES.HAD FLU SHOT/PNEUMONIA SHOT AND NOW FEES BAD. ALLERGY SHOT YESTERDAY. NO MORE BOUTS OF PANCREATITIS. HAVING PAIN BEHIND RIGHT EAR. STAYS. BMS: ONCE A DAY, BROWN & FORMED. STARTED OFF TAKING ZENPEP TID FOR 3-4 WEEKS AND THEN WENT TO BID AND NOW ONCE A DAY AND NOW EVERY OTHER DAY. NEEDS A REFILL.   PT DENIES FEVER, CHILLS, HEMATOCHEZIA, HEMATEMESIS, nausea, vomiting, melena, diarrhea, CHEST PAIN, SHORTNESS OF BREATH, CHANGE IN BOWEL IN HABITS, constipation, abdominal pain, problems swallowing, OR heartburn or indigestion.  Past Medical History  Diagnosis Date  . Hypertension   . High cholesterol   . Diabetes mellitus without complication     "borderline"  . Hypothyroidism   . Diverticulosis of colon   . Arthritis   . Pancreatitis    Past Surgical History  Procedure Laterality Date  . Appendectomy    . Vein surgery    . Cholecystectomy  1999    cholelithiasis  . Bladder tac    . Abdominal hysterectomy      partial  . Excision of breast biopsy    . Colonoscopy  01/2007    Dr Olevia Perches: normal  . Colonoscopy  12/2000    Dr Deatra Ina- diverticulosis  . Esophagogastroduodenoscopy      Dr Damaris Hippo cannot remember  . Eus N/A 03/24/2012    Dr. Paulita Fujita: suspected passed CBD stone as cause of acute pancreatitis, suspective chronic pancreatitis.     Allergies  Allergen Reactions  . Clindamycin/Lincomycin Hives  . Creon [Pancreatin] Hives  . Florastor [Yeast] Hives  . Zenpep [Pancrelipase (Lip-Prot-Amyl)] Hives    Current Outpatient Prescriptions  Medication Sig Dispense Refill  . amLODipine (NORVASC) 5 MG tablet Take 3 mg by mouth every morning.     Marland Kitchen aspirin EC 81 MG tablet Take 81 mg by mouth at bedtime.     . Calcium  Carbonate (CALTRATE 600 PO) Take 300-600 tablets by mouth 2 (two) times daily.     . carboxymethylcellulose (REFRESH PLUS) 0.5 % SOLN 1 drop 3 (three) times daily as needed.    . COSOPT PF 22.3-6.8 MG/ML SOLN     . cyanocobalamin (,VITAMIN B-12,) 1000 MCG/ML injection Inject 1,000 mcg into the muscle every 30 (thirty) days.    . hydrochlorothiazide (HYDRODIURIL) 25 MG tablet Take 25 mg by mouth every morning.     Marland Kitchen levofloxacin (LEVAQUIN) 250 MG tablet Take 250 mg by mouth daily.     Marland Kitchen levothyroxine (SYNTHROID, LEVOTHROID) 88 MCG tablet 88 mcg daily.    Marland Kitchen lisinopril (PRINIVIL,ZESTRIL) 2.5 MG tablet Take 2.5 mg by mouth daily.     Marland Kitchen loperamide (IMODIUM) 2 MG capsule Take 2 mg by mouth as needed for diarrhea or loose stools.    . metFORMIN (GLUCOPHAGE-XR) 500 MG 24 hr tablet Take 500 mg by mouth daily with breakfast.     . Omega-3 Fatty Acids (FISH OIL) 500 MG CAPS Take 2 capsules by mouth once.    . Pancrelipase, Lip-Prot-Amyl, (ZENPEP) 20000 UNITS CPEP Take 1 capsule (20,000 Units total) by mouth 3 (three) times daily before meals.    . potassium chloride SA (K-DUR,KLOR-CON) 20 MEQ tablet Take 30 mEq by mouth daily.     Marland Kitchen ZIOPTAN 0.0015 %  SOLN      Review of Systems PER HPI OTHERWISE ALL SYSTEMS ARE NEGATIVE.     Objective:   Physical Exam  Constitutional: She is oriented to person, place, and time. She appears well-developed and well-nourished. No distress.  HENT:  Head: Normocephalic and atraumatic.  Mouth/Throat: Oropharynx is clear and moist. No oropharyngeal exudate.  Eyes: Pupils are equal, round, and reactive to light. No scleral icterus.  Neck: Normal range of motion. Neck supple.  Cardiovascular: Normal rate, regular rhythm and normal heart sounds.   Pulmonary/Chest: Effort normal and breath sounds normal. No respiratory distress.  Abdominal: Soft. Bowel sounds are normal. She exhibits no distension. There is no tenderness.  Musculoskeletal: She exhibits edema (TRACE-1+ BIL  LEs).  Lymphadenopathy:    She has no cervical adenopathy.  Neurological: She is alert and oriented to person, place, and time.  NO  NEW FOCAL DEFICITS  Psychiatric:  FLAT AFFECT, NL MOOD  Vitals reviewed.         Assessment & Plan:

## 2014-12-21 NOTE — Assessment & Plan Note (Signed)
SYMPTOMS CONTROLLED/RESOLVED.  CONTINUE TO MONITOR SYMPTOMS. CALL WITH QUESTIONS OR CONCERNS. FOLLOW UP IN 6-12 MOS.

## 2014-12-21 NOTE — Patient Instructions (Addendum)
PLEASE CALL WITH QUESTIONS OR CONCERNS REGARDING YOUR DIARRHEA OR PANCREAS.  CONTINUE ZENPEP. INCREASE DOSE IF YOUR DIARRHEA GETS WORSE.  FOLLOW UP IN MAY OR OCT 2017.

## 2014-12-21 NOTE — Progress Notes (Signed)
ON RECALL  °

## 2014-12-21 NOTE — Progress Notes (Signed)
CC'ED TO PCP 

## 2014-12-21 NOTE — Assessment & Plan Note (Signed)
MANIFESTING AS DIARRHEA. CLINICALLY IMPROVED WITH ZENPEP  CONTINUE ZENPEP. SAMPLES GIVEN. FOLLOW UP IN MAY OR OCT 2017.

## 2015-01-15 ENCOUNTER — Observation Stay (HOSPITAL_COMMUNITY): Payer: Medicare Other

## 2015-01-15 ENCOUNTER — Inpatient Hospital Stay (HOSPITAL_COMMUNITY)
Admission: EM | Admit: 2015-01-15 | Discharge: 2015-01-17 | DRG: 482 | Disposition: A | Discharge status: Skilled Nursing Facility | Payer: Medicare Other | Attending: Internal Medicine | Admitting: Internal Medicine

## 2015-01-15 ENCOUNTER — Observation Stay (HOSPITAL_COMMUNITY): Payer: Medicare Other | Admitting: Anesthesiology

## 2015-01-15 ENCOUNTER — Emergency Department (HOSPITAL_COMMUNITY): Payer: Medicare Other

## 2015-01-15 ENCOUNTER — Encounter (HOSPITAL_COMMUNITY): Payer: Self-pay | Admitting: Emergency Medicine

## 2015-01-15 ENCOUNTER — Encounter (HOSPITAL_COMMUNITY)
Admission: EM | Disposition: A | Discharge status: Skilled Nursing Facility | Payer: Self-pay | Source: Home / Self Care | Attending: Internal Medicine

## 2015-01-15 DIAGNOSIS — Z7982 Long term (current) use of aspirin: Secondary | ICD-10-CM

## 2015-01-15 DIAGNOSIS — Z808 Family history of malignant neoplasm of other organs or systems: Secondary | ICD-10-CM | POA: Diagnosis not present

## 2015-01-15 DIAGNOSIS — Z833 Family history of diabetes mellitus: Secondary | ICD-10-CM

## 2015-01-15 DIAGNOSIS — S72012A Unspecified intracapsular fracture of left femur, initial encounter for closed fracture: Secondary | ICD-10-CM | POA: Diagnosis not present

## 2015-01-15 DIAGNOSIS — E7800 Pure hypercholesterolemia, unspecified: Secondary | ICD-10-CM | POA: Diagnosis not present

## 2015-01-15 DIAGNOSIS — Z880 Allergy status to penicillin: Secondary | ICD-10-CM

## 2015-01-15 DIAGNOSIS — E1149 Type 2 diabetes mellitus with other diabetic neurological complication: Secondary | ICD-10-CM

## 2015-01-15 DIAGNOSIS — Z7984 Long term (current) use of oral hypoglycemic drugs: Secondary | ICD-10-CM

## 2015-01-15 DIAGNOSIS — Z881 Allergy status to other antibiotic agents status: Secondary | ICD-10-CM

## 2015-01-15 DIAGNOSIS — E119 Type 2 diabetes mellitus without complications: Secondary | ICD-10-CM | POA: Diagnosis not present

## 2015-01-15 DIAGNOSIS — I10 Essential (primary) hypertension: Secondary | ICD-10-CM | POA: Diagnosis not present

## 2015-01-15 DIAGNOSIS — Z888 Allergy status to other drugs, medicaments and biological substances status: Secondary | ICD-10-CM

## 2015-01-15 DIAGNOSIS — E039 Hypothyroidism, unspecified: Secondary | ICD-10-CM | POA: Diagnosis not present

## 2015-01-15 DIAGNOSIS — E876 Hypokalemia: Secondary | ICD-10-CM | POA: Diagnosis not present

## 2015-01-15 DIAGNOSIS — W010XXA Fall on same level from slipping, tripping and stumbling without subsequent striking against object, initial encounter: Secondary | ICD-10-CM | POA: Diagnosis present

## 2015-01-15 DIAGNOSIS — M810 Age-related osteoporosis without current pathological fracture: Secondary | ICD-10-CM | POA: Diagnosis not present

## 2015-01-15 DIAGNOSIS — M25562 Pain in left knee: Secondary | ICD-10-CM | POA: Diagnosis not present

## 2015-01-15 DIAGNOSIS — S72009A Fracture of unspecified part of neck of unspecified femur, initial encounter for closed fracture: Secondary | ICD-10-CM | POA: Diagnosis present

## 2015-01-15 DIAGNOSIS — E109 Type 1 diabetes mellitus without complications: Secondary | ICD-10-CM

## 2015-01-15 DIAGNOSIS — Z883 Allergy status to other anti-infective agents status: Secondary | ICD-10-CM | POA: Diagnosis not present

## 2015-01-15 DIAGNOSIS — S72002A Fracture of unspecified part of neck of left femur, initial encounter for closed fracture: Secondary | ICD-10-CM | POA: Diagnosis present

## 2015-01-15 DIAGNOSIS — Z803 Family history of malignant neoplasm of breast: Secondary | ICD-10-CM

## 2015-01-15 DIAGNOSIS — Y92019 Unspecified place in single-family (private) house as the place of occurrence of the external cause: Secondary | ICD-10-CM

## 2015-01-15 DIAGNOSIS — Z8041 Family history of malignant neoplasm of ovary: Secondary | ICD-10-CM

## 2015-01-15 DIAGNOSIS — Z419 Encounter for procedure for purposes other than remedying health state, unspecified: Secondary | ICD-10-CM

## 2015-01-15 DIAGNOSIS — E1159 Type 2 diabetes mellitus with other circulatory complications: Secondary | ICD-10-CM | POA: Diagnosis present

## 2015-01-15 DIAGNOSIS — T148XXA Other injury of unspecified body region, initial encounter: Secondary | ICD-10-CM | POA: Insufficient documentation

## 2015-01-15 HISTORY — PX: HIP PINNING,CANNULATED: SHX1758

## 2015-01-15 LAB — CBC WITH DIFFERENTIAL/PLATELET
Basophils Absolute: 0 10*3/uL (ref 0.0–0.1)
Basophils Relative: 0 %
Eosinophils Absolute: 0.2 10*3/uL (ref 0.0–0.7)
Eosinophils Relative: 3 %
HEMATOCRIT: 39.2 % (ref 36.0–46.0)
HEMOGLOBIN: 13.2 g/dL (ref 12.0–15.0)
LYMPHS ABS: 1.3 10*3/uL (ref 0.7–4.0)
Lymphocytes Relative: 17 %
MCH: 30.1 pg (ref 26.0–34.0)
MCHC: 33.7 g/dL (ref 30.0–36.0)
MCV: 89.5 fL (ref 78.0–100.0)
MONOS PCT: 10 %
Monocytes Absolute: 0.7 10*3/uL (ref 0.1–1.0)
NEUTROS ABS: 5.3 10*3/uL (ref 1.7–7.7)
NEUTROS PCT: 70 %
Platelets: 189 10*3/uL (ref 150–400)
RBC: 4.38 MIL/uL (ref 3.87–5.11)
RDW: 13 % (ref 11.5–15.5)
WBC: 7.6 10*3/uL (ref 4.0–10.5)

## 2015-01-15 LAB — BASIC METABOLIC PANEL
ANION GAP: 5 (ref 5–15)
BUN: 11 mg/dL (ref 6–20)
CHLORIDE: 101 mmol/L (ref 101–111)
CO2: 31 mmol/L (ref 22–32)
Calcium: 9.3 mg/dL (ref 8.9–10.3)
Creatinine, Ser: 0.53 mg/dL (ref 0.44–1.00)
GFR calc non Af Amer: >60 mL/min (ref >60)
Glucose, Bld: 103 mg/dL — ABNORMAL HIGH (ref 65–99)
POTASSIUM: 3.4 mmol/L — AB (ref 3.5–5.1)
SODIUM: 137 mmol/L (ref 135–145)

## 2015-01-15 LAB — TROPONIN I: Troponin I: <0.03 ng/mL (ref <0.031)

## 2015-01-15 LAB — PROTIME-INR
INR: 1.15 (ref 0.00–1.49)
PROTHROMBIN TIME: 14.9 s (ref 11.6–15.2)

## 2015-01-15 LAB — GLUCOSE, CAPILLARY
Glucose-Capillary: 131 mg/dL — ABNORMAL HIGH (ref 65–99)
Glucose-Capillary: 163 mg/dL — ABNORMAL HIGH (ref 65–99)

## 2015-01-15 LAB — CBG MONITORING, ED: Glucose-Capillary: 107 mg/dL — ABNORMAL HIGH (ref 65–99)

## 2015-01-15 SURGERY — FIXATION, FEMUR, NECK, PERCUTANEOUS, USING SCREW
Anesthesia: Spinal | Laterality: Left

## 2015-01-15 MED ORDER — CEFAZOLIN SODIUM-DEXTROSE 2-3 GM-% IV SOLR
INTRAVENOUS | Status: DC | PRN
  Administered 2015-01-15: 2 g via INTRAVENOUS

## 2015-01-15 MED ORDER — MEPERIDINE HCL 25 MG/ML IJ SOLN: 6.2500 mg | INTRAMUSCULAR | Status: DC | PRN

## 2015-01-15 MED ORDER — CEFAZOLIN SODIUM-DEXTROSE 2-3 GM-% IV SOLR
2.0000 g | Freq: Four times a day (QID) | INTRAVENOUS | Status: AC
Start: 2015-01-16 — End: 2015-01-16
  Administered 2015-01-16 (×2): 2 g via INTRAVENOUS
  Filled 2015-01-15 (×2): qty 50

## 2015-01-15 MED ORDER — METOCLOPRAMIDE HCL 5 MG PO TABS: 5.0000 mg | ORAL_TABLET | Freq: Three times a day (TID) | ORAL | Status: DC | PRN

## 2015-01-15 MED ORDER — ASPIRIN EC 325 MG PO TBEC
325.0000 mg | DELAYED_RELEASE_TABLET | Freq: Every day | ORAL | Status: DC
  Administered 2015-01-16 – 2015-01-17 (×2): 325 mg via ORAL
  Filled 2015-01-15 (×2): qty 1

## 2015-01-15 MED ORDER — DIPHENHYDRAMINE HCL 50 MG/ML IJ SOLN
INTRAMUSCULAR | Status: DC | PRN
  Administered 2015-01-15: 25 mg via INTRAVENOUS

## 2015-01-15 MED ORDER — DIPHENHYDRAMINE HCL 50 MG/ML IJ SOLN
INTRAMUSCULAR | Status: AC
  Filled 2015-01-15: qty 1

## 2015-01-15 MED ORDER — ONDANSETRON HCL 4 MG PO TABS
4.0000 mg | ORAL_TABLET | Freq: Four times a day (QID) | ORAL | Status: DC | PRN
  Administered 2015-01-17: 4 mg via ORAL
  Filled 2015-01-15: qty 1

## 2015-01-15 MED ORDER — METOCLOPRAMIDE HCL 5 MG/ML IJ SOLN: 5.0000 mg | Freq: Three times a day (TID) | INTRAMUSCULAR | Status: DC | PRN

## 2015-01-15 MED ORDER — ACETAMINOPHEN 650 MG RE SUPP: 650.0000 mg | Freq: Four times a day (QID) | RECTAL | Status: DC | PRN

## 2015-01-15 MED ORDER — HYDROCODONE-ACETAMINOPHEN 5-325 MG PO TABS: 1.0000 | ORAL_TABLET | Freq: Four times a day (QID) | ORAL | Status: DC | PRN

## 2015-01-15 MED ORDER — HYDROCODONE-ACETAMINOPHEN 5-325 MG PO TABS: 1.0000 | ORAL_TABLET | ORAL | Status: DC | PRN

## 2015-01-15 MED ORDER — MENTHOL 3 MG MT LOZG: 1.0000 | LOZENGE | OROMUCOSAL | Status: DC | PRN

## 2015-01-15 MED ORDER — PROPOFOL 10 MG/ML IV BOLUS
INTRAVENOUS | Status: DC | PRN
  Administered 2015-01-15: 20 mg via INTRAVENOUS
  Administered 2015-01-15: 50 mg via INTRAVENOUS

## 2015-01-15 MED ORDER — FENTANYL CITRATE (PF) 250 MCG/5ML IJ SOLN
INTRAMUSCULAR | Status: AC
  Filled 2015-01-15: qty 5

## 2015-01-15 MED ORDER — FENTANYL CITRATE (PF) 100 MCG/2ML IJ SOLN
12.5000 ug | INTRAMUSCULAR | Status: DC | PRN
  Administered 2015-01-15: 12.5 ug via INTRAVENOUS
  Filled 2015-01-15: qty 2

## 2015-01-15 MED ORDER — SODIUM CHLORIDE 0.9 % IV SOLN
INTRAVENOUS | Status: DC
  Administered 2015-01-15: 15:00:00 via INTRAVENOUS

## 2015-01-15 MED ORDER — HYDROMORPHONE HCL 1 MG/ML IJ SOLN: 0.2500 mg | INTRAMUSCULAR | Status: DC | PRN

## 2015-01-15 MED ORDER — PROPOFOL 500 MG/50ML IV EMUL
INTRAVENOUS | Status: DC | PRN
  Administered 2015-01-15: 50 ug/kg/min via INTRAVENOUS

## 2015-01-15 MED ORDER — INSULIN ASPART 100 UNIT/ML ~~LOC~~ SOLN
0.0000 [IU] | SUBCUTANEOUS | Status: DC
  Administered 2015-01-16 (×3): 2 [IU] via SUBCUTANEOUS
  Administered 2015-01-16: 3 [IU] via SUBCUTANEOUS

## 2015-01-15 MED ORDER — MORPHINE SULFATE (PF) 4 MG/ML IV SOLN
4.0000 mg | INTRAVENOUS | Status: DC | PRN
  Administered 2015-01-15: 4 mg via INTRAVENOUS
  Filled 2015-01-15: qty 1

## 2015-01-15 MED ORDER — PROMETHAZINE HCL 25 MG/ML IJ SOLN: 6.2500 mg | INTRAMUSCULAR | Status: DC | PRN

## 2015-01-15 MED ORDER — POTASSIUM CHLORIDE IN NACL 20-0.9 MEQ/L-% IV SOLN
INTRAVENOUS | Status: DC
  Administered 2015-01-15: 17:00:00 via INTRAVENOUS
  Filled 2015-01-15 (×2): qty 1000

## 2015-01-15 MED ORDER — PROMETHAZINE HCL 25 MG/ML IJ SOLN
6.2500 mg | Freq: Once | INTRAMUSCULAR | Status: AC
Start: 2015-01-15 — End: 2015-01-15
  Administered 2015-01-15: 6.25 mg via INTRAVENOUS
  Filled 2015-01-15: qty 1

## 2015-01-15 MED ORDER — DEXAMETHASONE SODIUM PHOSPHATE 4 MG/ML IJ SOLN
INTRAMUSCULAR | Status: DC | PRN
  Administered 2015-01-15: 4 mg via INTRAVENOUS

## 2015-01-15 MED ORDER — HYDRALAZINE HCL 20 MG/ML IJ SOLN: 5.0000 mg | INTRAMUSCULAR | Status: DC | PRN

## 2015-01-15 MED ORDER — DEXAMETHASONE SODIUM PHOSPHATE 4 MG/ML IJ SOLN
INTRAMUSCULAR | Status: AC
  Filled 2015-01-15: qty 1

## 2015-01-15 MED ORDER — PHENOL 1.4 % MT LIQD: 1.0000 | OROMUCOSAL | Status: DC | PRN

## 2015-01-15 MED ORDER — 0.9 % SODIUM CHLORIDE (POUR BTL) OPTIME
TOPICAL | Status: DC | PRN
  Administered 2015-01-15: 300 mL

## 2015-01-15 MED ORDER — ACETAMINOPHEN 325 MG PO TABS
650.0000 mg | ORAL_TABLET | Freq: Four times a day (QID) | ORAL | Status: DC | PRN
  Administered 2015-01-16: 650 mg via ORAL
  Administered 2015-01-16: 325 mg via ORAL
  Administered 2015-01-17 (×2): 650 mg via ORAL
  Filled 2015-01-15 (×4): qty 2

## 2015-01-15 MED ORDER — LACTATED RINGERS IV SOLN
INTRAVENOUS | Status: DC | PRN
  Administered 2015-01-15: 19:00:00 via INTRAVENOUS

## 2015-01-15 MED ORDER — DOCUSATE SODIUM 100 MG PO CAPS
100.0000 mg | ORAL_CAPSULE | Freq: Two times a day (BID) | ORAL | Status: DC
Start: 2015-01-15 — End: 2017-09-27

## 2015-01-15 MED ORDER — SODIUM CHLORIDE 0.9 % IV SOLN
INTRAVENOUS | Status: AC
  Administered 2015-01-16: 75 mL/h via INTRAVENOUS

## 2015-01-15 MED ORDER — ASPIRIN EC 325 MG PO TBEC: 325.0000 mg | DELAYED_RELEASE_TABLET | Freq: Every day | ORAL | Status: DC

## 2015-01-15 MED ORDER — ONDANSETRON HCL 4 MG/2ML IJ SOLN: 4.0000 mg | Freq: Four times a day (QID) | INTRAMUSCULAR | Status: DC | PRN

## 2015-01-15 SURGICAL SUPPLY — 36 items
BIT DRILL 4.9 CANNULATED (BIT) ×1
BIT DRILL CANN QC 4.9 LRG (BIT) ×1 IMPLANT
BNDG COHESIVE 4X5 TAN STRL (GAUZE/BANDAGES/DRESSINGS) ×3 IMPLANT
BNDG GAUZE ELAST 4 BULKY (GAUZE/BANDAGES/DRESSINGS) ×3 IMPLANT
COVER PERINEAL POST (MISCELLANEOUS) ×3 IMPLANT
COVER SURGICAL LIGHT HANDLE (MISCELLANEOUS) ×3 IMPLANT
DRAPE STERI IOBAN 125X83 (DRAPES) ×3 IMPLANT
DRILL BIT CANNULATED 4.9 (BIT) ×2
DRSG MEPILEX BORDER 4X4 (GAUZE/BANDAGES/DRESSINGS) ×3 IMPLANT
DURAPREP 26ML APPLICATOR (WOUND CARE) ×3 IMPLANT
ELECT REM PT RETURN 9FT ADLT (ELECTROSURGICAL) ×3
ELECTRODE REM PT RTRN 9FT ADLT (ELECTROSURGICAL) ×1 IMPLANT
GLOVE BIO SURGEON STRL SZ7 (GLOVE) IMPLANT
GLOVE BIO SURGEON STRL SZ7.5 (GLOVE) ×3 IMPLANT
GLOVE BIOGEL PI IND STRL 7.0 (GLOVE) IMPLANT
GLOVE BIOGEL PI IND STRL 8 (GLOVE) ×1 IMPLANT
GLOVE BIOGEL PI INDICATOR 7.0 (GLOVE)
GLOVE BIOGEL PI INDICATOR 8 (GLOVE) ×2
GOWN STRL REUS W/ TWL LRG LVL3 (GOWN DISPOSABLE) ×1 IMPLANT
GOWN STRL REUS W/TWL LRG LVL3 (GOWN DISPOSABLE) ×2
GUIDEWIRE THRD ASNIS 3.2X300 (WIRE) ×9 IMPLANT
KIT BASIN OR (CUSTOM PROCEDURE TRAY) ×3 IMPLANT
KIT ROOM TURNOVER OR (KITS) ×3 IMPLANT
MANIFOLD NEPTUNE II (INSTRUMENTS) IMPLANT
NS IRRIG 1000ML POUR BTL (IV SOLUTION) ×3 IMPLANT
PACK GENERAL/GYN (CUSTOM PROCEDURE TRAY) ×3 IMPLANT
PAD ARMBOARD 7.5X6 YLW CONV (MISCELLANEOUS) ×6 IMPLANT
SCREW ASNIS 85MM (Screw) ×3 IMPLANT
SCREW ASNIS 90MM (Screw) ×3 IMPLANT
SCREW ASNIS 95MM (Screw) ×2 IMPLANT
SUT MON AB 2-0 CT1 36 (SUTURE) ×3 IMPLANT
SUT VIC AB 0 CT1 27 (SUTURE)
SUT VIC AB 0 CT1 27XBRD ANBCTR (SUTURE) IMPLANT
TOWEL OR 17X24 6PK STRL BLUE (TOWEL DISPOSABLE) ×3 IMPLANT
TOWEL OR 17X26 10 PK STRL BLUE (TOWEL DISPOSABLE) ×3 IMPLANT
WATER STERILE IRR 1000ML POUR (IV SOLUTION) IMPLANT

## 2015-01-15 NOTE — ED Notes (Signed)
Made Dr. Percell Miller aware of pt's arrival, as well as the OR.

## 2015-01-15 NOTE — ED Notes (Signed)
Fell about 2100 on Saturday and injury to left leg, rates pain 10/10.

## 2015-01-15 NOTE — ED Notes (Signed)
OR said to get pt ready and then call OR back and they will call Dr. Percell Miller. Dr. Percell Miller will see her in short stay.

## 2015-01-15 NOTE — ED Notes (Signed)
Pt c/o mid upper epigastric pain. Dr Thurnell Garbe informed.

## 2015-01-15 NOTE — Transfer of Care (Signed)
Immediate Anesthesia Transfer of Care Note  Patient: Kathleen [name]  Procedure(s) Performed: Procedure(s): CANNULATED HIP PINNING/LEFT (Left)  Patient Location: PACU  Anesthesia Type:MAC and Spinal  Level of Consciousness: awake, alert  and oriented  Airway & Oxygen Therapy: Patient Spontanous Breathing  Post-op Assessment: Report given to RN and Post -op Vital signs reviewed and stable  Post vital signs: Reviewed and stable  Last Vitals:  Filed Vitals:   01/15/15 1744 01/15/15 1839  BP: 140/70 160/61  Pulse: 85 77  Temp: 36.4 C 36.4 C  Resp: 18 16    Complications: No apparent anesthesia complications

## 2015-01-15 NOTE — ED Notes (Signed)
Called [facility] to transport Pt to [facility].

## 2015-01-15 NOTE — Anesthesia Procedure Notes (Addendum)
Procedure Name: MAC Date/Time: 01/15/2015 8:05 PM Performed by: Manuela Schwartz B Pre-anesthesia Checklist: Patient identified, Emergency Drugs available, Suction available, Patient being monitored and Timeout performed Patient Re-evaluated:Patient Re-evaluated prior to inductionOxygen Delivery Method: Nasal cannula   Spinal Patient location during procedure: OR Staffing Anesthesiologist: Nolon Nations Performed by: anesthesiologist  Preanesthetic Checklist Completed: patient identified, site marked, surgical consent, pre-op evaluation, timeout performed, IV checked, risks and benefits discussed and monitors and equipment checked Spinal Block Patient position: left lateral decubitus Prep: ChloraPrep Patient monitoring: heart rate, continuous pulse ox and blood pressure Approach: right paramedian Location: L2-3 Injection technique: single-shot Needle Needle type: Sprotte  Needle gauge: 24 G Needle length: 9 cm Additional Notes Expiration date of kit checked and confirmed. Patient tolerated procedure well, without complications.

## 2015-01-15 NOTE — Anesthesia Preprocedure Evaluation (Addendum)
Anesthesia Evaluation  Patient identified by MRN, date of birth, ID band Patient awake    Reviewed: Allergy & Precautions, H&P , NPO status , Patient's Chart, lab work & pertinent test results  Airway Mallampati: II  TM Distance: >3 FB Neck ROM: full    Dental  (+) Edentulous Upper, Edentulous Lower, Dental Advisory Given   Pulmonary neg pulmonary ROS,    Pulmonary exam normal breath sounds clear to auscultation       Cardiovascular Exercise Tolerance: Good hypertension, Pt. on medications + Peripheral Vascular Disease  Normal cardiovascular exam Rhythm:regular Rate:Normal     Neuro/Psych negative neurological ROS  negative psych ROS   GI/Hepatic negative GI ROS, Neg liver ROS, Elevated LFT's   Endo/Other  diabetes, Well Controlled, Type 2, Oral Hypoglycemic AgentsHypothyroidism   Renal/GU negative Renal ROS     Musculoskeletal  (+) Arthritis ,   Abdominal   Peds  Hematology negative hematology ROS (+)   Anesthesia Other Findings   Reproductive/Obstetrics negative OB ROS                             Anesthesia Physical  Anesthesia Plan  ASA: III  Anesthesia Plan: Spinal   Post-op Pain Management:    Induction: Intravenous  Airway Management Planned:   Additional Equipment:   Intra-op Plan:   Post-operative Plan:   Informed Consent: I have reviewed the patients History and Physical, chart, labs and discussed the procedure including the risks, benefits and alternatives for the proposed anesthesia with the patient or authorized representative who has indicated his/her understanding and acceptance.   Dental advisory given  Plan Discussed with: CRNA  Anesthesia Plan Comments:        Anesthesia Quick Evaluation

## 2015-01-15 NOTE — ED Provider Notes (Signed)
CSN: [id number]     Arrival date & time 01/15/15  1149 History   First MD Initiated Contact with Patient 01/15/15 1243     Chief Complaint  Patient presents with  . Fall  . Leg Injury    left      HPI Pt was seen at 1250. Per pt, c/o sudden onset and resolution of one episode of slip and fall 2 days ago. Pt states she tripped and fell, landing on her left hip. Pt c/o pain in her left hip and knee since the fall. Pt has been ambulatory since the fall. Pt states she has been walking with a cane "because of the pain." Denies any other complaints. Denies hitting head, no LOC, no AMS, no neck or back pain, no focal motor weakness, no tingling/numbness in extremities.    Past Medical History  Diagnosis Date  . Hypertension   . High cholesterol   . Diabetes mellitus without complication ([place])     "borderline"  . Hypothyroidism   . Diverticulosis of colon   . Arthritis   . Pancreatitis    Past Surgical History  Procedure Laterality Date  . Appendectomy    . Vein surgery    . Cholecystectomy  1999    cholelithiasis  . Bladder tac    . Abdominal hysterectomy      partial  . Excision of breast biopsy    . Colonoscopy  01/2007    Dr Olevia Perches: normal  . Colonoscopy  12/2000    Dr Deatra Ina- diverticulosis  . Esophagogastroduodenoscopy      Dr Damaris Hippo cannot remember  . Eus N/A 03/24/2012    Dr. Paulita Fujita: suspected passed CBD stone as cause of acute pancreatitis, suspective chronic pancreatitis.    Family History  Problem Relation Age of Onset  . Diabetes Father   . Diabetes Mother   . Cancer Mother     ovarian  . Cancer Sister 80    breast  . Cancer Sister 22    bone   Social History  Substance Use Topics  . Smoking status: Never Smoker   . Smokeless tobacco: Never Used  . Alcohol Use: No   OB History    Gravida Para Term Preterm AB TAB SAB Ectopic Multiple Living   2 2 2       1      Review of Systems ROS: Statement: All systems negative except as marked or noted in  the HPI; Constitutional: Negative for fever and chills. ; ; Eyes: Negative for eye pain, redness and discharge. ; ; ENMT: Negative for ear pain, hoarseness, nasal congestion, sinus pressure and sore throat. ; ; Cardiovascular: Negative for chest pain, palpitations, diaphoresis, dyspnea and peripheral edema. ; ; Respiratory: Negative for cough, wheezing and stridor. ; ; Gastrointestinal: Negative for nausea, vomiting, diarrhea, abdominal pain, blood in stool, hematemesis, jaundice and rectal bleeding. . ; ; Genitourinary: Negative for dysuria, flank pain and hematuria. ; ; Musculoskeletal: +left hip and knee pain. Negative for back pain and neck pain. Negative for swelling and deformity.; ; Skin: Negative for pruritus, rash, abrasions, blisters, bruising and skin lesion.; ; Neuro: Negative for headache, lightheadedness and neck stiffness. Negative for weakness, altered level of consciousness , altered mental status, extremity weakness, paresthesias, involuntary movement, seizure and syncope.      Allergies  Clindamycin/lincomycin; Creon; Florastor; and Zenpep  Home Medications   Prior to Admission medications   Medication Sig Start Date End Date Taking? Authorizing Provider  amLODipine (NORVASC) 5 MG  tablet Take 3 mg by mouth every morning.     Historical Provider, MD  aspirin EC 81 MG tablet Take 81 mg by mouth at bedtime.     Historical Provider, MD  Calcium Carbonate (CALTRATE 600 PO) Take 300-600 tablets by mouth 2 (two) times daily.     Historical Provider, MD  carboxymethylcellulose (REFRESH PLUS) 0.5 % SOLN 1 drop 3 (three) times daily as needed.    Historical Provider, MD  COSOPT PF 22.3-6.8 MG/ML SOLN  10/11/13   Historical Provider, MD  cyanocobalamin (,VITAMIN B-12,) 1000 MCG/ML injection Inject 1,000 mcg into the muscle every 30 (thirty) days.    Historical Provider, MD  hydrochlorothiazide (HYDRODIURIL) 25 MG tablet Take 25 mg by mouth every morning.     Historical Provider, MD   levofloxacin (LEVAQUIN) 250 MG tablet Take 250 mg by mouth daily.  11/01/13   Historical Provider, MD  levothyroxine (SYNTHROID, LEVOTHROID) 88 MCG tablet 88 mcg daily. 10/26/13   Historical Provider, MD  lisinopril (PRINIVIL,ZESTRIL) 2.5 MG tablet Take 2.5 mg by mouth daily.     Historical Provider, MD  loperamide (IMODIUM) 2 MG capsule Take 2 mg by mouth as needed for diarrhea or loose stools.    Historical Provider, MD  metFORMIN (GLUCOPHAGE-XR) 500 MG 24 hr tablet Take 500 mg by mouth daily with breakfast.     Historical Provider, MD  Omega-3 Fatty Acids (FISH OIL) 500 MG CAPS Take 2 capsules by mouth once.    Historical Provider, MD  Pancrelipase, Lip-Prot-Amyl, (ZENPEP) 20000 UNITS CPEP Take 1 capsule (20,000 Units total) by mouth 3 (three) times daily before meals. 12/21/14   Danie Binder, MD  potassium chloride SA (K-DUR,KLOR-CON) 20 MEQ tablet Take 30 mEq by mouth daily.     Historical Provider, MD  ZIOPTAN 0.0015 % SOLN  10/11/13   Historical Provider, MD   BP 162/67 mmHg  Pulse 75  Temp(Src) 97.5 F (36.4 C) (Oral)  Resp 16  Ht 5\' 4"  (1.626 m)  Wt 140 lb (63.504 kg)  BMI 24.02 kg/m2  SpO2 97% Physical Exam  1255: Physical examination:  Nursing notes reviewed; Vital signs and O2 SAT reviewed;  Constitutional: Well developed, Well nourished, Well hydrated, In no acute distress; Head:  Normocephalic, atraumatic; Eyes: EOMI, PERRL, No scleral icterus; ENMT: Mouth and pharynx normal, Mucous membranes moist; Neck: Supple, Full range of motion, No lymphadenopathy; Cardiovascular: Regular rate and rhythm, No gallop; Respiratory: Breath sounds clear & equal bilaterally, No wheezes.  Speaking full sentences with ease, Normal respiratory effort/excursion; Chest: Nontender, Movement normal; Abdomen: Soft, Nontender, Nondistended, Normal bowel sounds; Genitourinary: No CVA tenderness; Spine:  No midline CS, TS, LS tenderness.;; Extremities: Pulses normal, Pelvis stable. +left hip tenderness to  palp, no obvious deformity. +FROM left knee.  No ligamentous laxity.  No patellar or quad tendon step-offs.  NMS intact left foot, strong pedal pp. +plantarflexion of left foot w/calf squeeze.  No palpable gap left Achilles's tendon.  No proximal fibular head tenderness.  No edema, erythema, warmth, ecchymosis or deformity.  No specific area of point tenderness.  No edema, No ecchymosis, no abrasions. NT left ankle/foot. No calf edema or asymmetry.; Neuro: AA&Ox3, Major CN grossly intact.  Speech clear. No gross focal motor or sensory deficits in extremities.; Skin: Color normal, Warm, Dry.   ED Course  Procedures (including critical care time)   Labs Review  Imaging Review  I have personally reviewed and evaluated these images and lab results as part of my medical decision-making.  EKG Interpretation   Date/Time:  Monday January 15 2015 14:39:48 EST Ventricular Rate:  88 PR Interval:    QRS Duration: 72 QT Interval:  486 QTC Calculation: 588 R Axis:   28 Text Interpretation:  Normal sinus rhythm Low voltage, precordial leads  Borderline T abnormalities, diffuse leads Prolonged QT interval Baseline  wander Artifact When compared with ECG of 10/14/2007 QT has lengthened  Confirmed by [facility]  MD, Nunzio Cory [phone]) on 01/15/2015 3:11:01 PM      MDM  MDM Reviewed: previous chart, nursing note and vitals Reviewed previous: labs and ECG Interpretation: x-ray, ECG, labs and CT scan       Dg Chest Port 1 View 01/15/2015  CLINICAL DATA:  81 year old female status post fall with hip fracture. Initial encounter. EXAM: PORTABLE CHEST 1 VIEW COMPARISON:  08/30/2014. FINDINGS: Portable AP semi upright view at 1430 hours. Stable lung volumes. Normal cardiac size and mediastinal contours. Allowing for portable technique, the lungs are clear. No pleural effusion or pulmonary edema. IMPRESSION: No acute cardiopulmonary abnormality. Electronically Signed   By: [name] M.D.   On: 01/15/2015 14:46    Dg Knee Complete 4 Views Left 01/15/2015  CLINICAL DATA:  Left knee pain status post fall. EXAM: LEFT KNEE - COMPLETE 4+ VIEW COMPARISON:  None. FINDINGS: There is no evidence of fracture, dislocation, or joint effusion. There is mild 3 compartment osteoarthritis. There is generalized osteopenia. Soft tissues are unremarkable. IMPRESSION: Generalized osteopenia without evidence of fracture about the left knee. Mild 3 compartment osteoarthritis. Electronically Signed   By: [name] M.D.   On: 01/15/2015 14:00   Dg Hip Unilat With Pelvis 2-3 Views Left 01/15/2015  CLINICAL DATA:  Left upper leg pain due to fall on Saturday night. EXAM: DG HIP (WITH OR WITHOUT PELVIS) 2-3V LEFT COMPARISON:  None. FINDINGS: There is foreshortening of the left femoral neck with lucency across femoral neck compatible with left femoral neck fracture. No angulation. No subluxation or dislocation. Mild degenerative changes within the left hip. IMPRESSION: Foreshortening an lucency across the left femoral neck concerning for left femoral neck fracture. Electronically Signed   By: [name] M.D.   On: 01/15/2015 13:58    Results for orders placed or performed during the hospital encounter of [id number]  Basic metabolic panel  Result Value Ref Range   Sodium 137 135 - 145 mmol/L   Potassium 3.4 (L) 3.5 - 5.1 mmol/L   Chloride 101 101 - 111 mmol/L   CO2 31 22 - 32 mmol/L   Glucose, Bld 103 (H) 65 - 99 mg/dL   BUN 11 6 - 20 mg/dL   Creatinine, Ser 0.53 0.44 - 1.00 mg/dL   Calcium 9.3 8.9 - 10.3 mg/dL   GFR calc non Af Amer >60 >60 mL/min   GFR calc Af Amer >60 >60 mL/min   Anion gap 5 5 - 15  CBC with Differential  Result Value Ref Range   WBC 7.6 4.0 - 10.5 K/uL   RBC 4.38 3.87 - 5.11 MIL/uL   Hemoglobin 13.2 12.0 - 15.0 g/dL   HCT 39.2 36.0 - 46.0 %   MCV 89.5 78.0 - 100.0 fL   MCH 30.1 26.0 - 34.0 pg   MCHC 33.7 30.0 - 36.0 g/dL   RDW 13.0 11.5 - 15.5 %   Platelets 189 150 - 400 K/uL   Neutrophils  Relative % 70 %   Neutro Abs 5.3 1.7 - 7.7 K/uL   Lymphocytes Relative 17 %  Lymphs Abs 1.3 0.7 - 4.0 K/uL   Monocytes Relative 10 %   Monocytes Absolute 0.7 0.1 - 1.0 K/uL   Eosinophils Relative 3 %   Eosinophils Absolute 0.2 0.0 - 0.7 K/uL   Basophils Relative 0 %   Basophils Absolute 0.0 0.0 - 0.1 K/uL  Protime-INR  Result Value Ref Range   Prothrombin Time 14.9 11.6 - 15.2 seconds   INR 1.15 0.00 - 1.49  Troponin I  Result Value Ref Range   Troponin I <0.03 <0.031 ng/mL   Ct Hip Left Wo Contrast 01/15/2015  CLINICAL DATA:  Left hip pain.  Status post fall. EXAM: CT OF THE LEFT HIP WITHOUT CONTRAST TECHNIQUE: Multidetector CT imaging of the left hip was performed according to the standard protocol. Multiplanar CT image reconstructions were also generated. COMPARISON:  None. FINDINGS: There is generalized osteopenia. There is a mildly impacted left subcapital femoral fracture. There is no aggressive lytic or sclerotic osseous lesion. There is mild osteoarthritis of the left hip. The left superior and inferior pubic rami are intact. There is no fluid collection or hematoma.  The muscles are normal. There is no inguinal lymphadenopathy.  There is no inguinal hernia. IMPRESSION: 1. Nondisplaced, mildly impacted left subcapital femoral fracture. Electronically Signed   By: Kathreen Devoid   On: 01/15/2015 15:57     1500:  T/C to [facility] Ortho Dr. Percell Miller, case discussed, including:  HPI, pertinent PM/SHx, VS/PE, dx testing, ED course and treatment:  Agreeable to consult, requests to keep NPO, obtain CT hip to further clarify fx, admit to [facility] but transfer to [facility] ED so he can see pt there and take her to OR.   1640:  CT and labs as above.  T/C to [facility] Dr. Anastasio Champion, case discussed, including:  HPI, pertinent PM/SHx, VS/PE, dx testing, ED course and treatment:  Agreeable to facilitate transfer for admit to [facility], requests to write temporary orders, obtain medical bed to team MCAdmits/.  T/C to  [facility] Ortho Dr. Percell Miller, case discussed, including:  HPI, pertinent PM/SHx, VS/PE, dx testing, ED course and treatment:  Updated regarding CT scan and plan of care; agreeable. Will transfer to [facility] ED; EDP given report.  Francine Graven, DO 01/17/15 1815

## 2015-01-15 NOTE — H&P (Signed)
[facility] History and Physical  Kathleen [name] [id number] DOB: Aug 28, 1931 DOA: 01/15/2015  Referring physician: ER PCP: [name]   Chief Complaint: Left hip pain  HPI: Kathleen [name] is a 81 y.o. female  This is an 81 year old lady who had an accidental fall a couple of days ago and she tripped and fell landing on her left hip. There was no head injury or loss of consciousness. She has been ambulatory since the fall but has been complaining of pain in the left hip and knee since she fell. She has been walking with a cane because of the pain. There are no other complaints. She denies any chest pain, palpitations or dyspnea. Evaluation in the emergency room shows her to have a left hip fracture. She is now being admitted and will be transferred to [facility] for further treatment.   Review of Systems:  Apart from symptoms above, all systems are negative..   Past Medical History  Diagnosis Date  . Hypertension   . High cholesterol   . Diabetes mellitus without complication ([place])     "borderline"  . Hypothyroidism   . Diverticulosis of colon   . Arthritis   . Pancreatitis    Past Surgical History  Procedure Laterality Date  . Appendectomy    . Vein surgery    . Cholecystectomy  1999    cholelithiasis  . Bladder tac    . Abdominal hysterectomy      partial  . Excision of breast biopsy    . Colonoscopy  01/2007    Dr Olevia Perches: normal  . Colonoscopy  12/2000    Dr Deatra Ina- diverticulosis  . Esophagogastroduodenoscopy      Dr Damaris Hippo cannot remember  . Eus N/A 03/24/2012    Dr. Paulita Fujita: suspected passed CBD stone as cause of acute pancreatitis, suspective chronic pancreatitis.    Social History:  reports that she has never smoked. She has never used smokeless tobacco. She reports that she does not drink alcohol or use illicit drugs.  Allergies  Allergen Reactions  . Alendronate     Unknown reaction  . Cephalexin     Unknown reaction  .  Clindamycin/Lincomycin Hives  . Creon [Pancreatin] Hives  . Doxycycline     Unknown reaction  . Florastor [Yeast] Hives  . Penicillins     Has patient had a PCN reaction causing immediate rash, facial/tongue/throat swelling, SOB or lightheadedness with hypotension: unknown Has patient had a PCN reaction causing severe rash involving mucus membranes or skin necrosis: unknown Has patient had a PCN reaction that required hospitalization unknown Has patient had a PCN reaction occurring within the last 10 years:unknown If all of the above answers are "NO", then may proceed with Cephalosporin use.   Marland Kitchen Zenpep [Pancrelipase (Lip-Prot-Amyl)] Hives    Family History  Problem Relation Age of Onset  . Diabetes Father   . Diabetes Mother   . Cancer Mother     ovarian  . Cancer Sister 22    breast  . Cancer Sister 47    bone    Prior to Admission medications   Medication Sig Start Date End Date Taking? Authorizing Provider  amLODipine (NORVASC) 5 MG tablet Take 5 mg by mouth every morning.    Yes Historical Provider, MD  aspirin EC 81 MG tablet Take 81 mg by mouth at bedtime.    Yes Historical Provider, MD  Calcium Carbonate (CALTRATE 600 PO) Take 600 tablets by mouth 2 (two) times daily.  Yes Historical Provider, MD  carboxymethylcellulose (REFRESH PLUS) 0.5 % SOLN 1 drop 3 (three) times daily as needed.   Yes Historical Provider, MD  COSOPT PF 22.3-6.8 MG/ML SOLN Place 1 drop into both eyes 2 (two) times daily.  10/11/13  Yes Historical Provider, MD  cyanocobalamin (,VITAMIN B-12,) 1000 MCG/ML injection Inject 1,000 mcg into the muscle every 30 (thirty) days.   Yes Historical Provider, MD  fluticasone (FLONASE) 50 MCG/ACT nasal spray Place 1 spray into both nostrils daily.   Yes Historical Provider, MD  hydrochlorothiazide (HYDRODIURIL) 25 MG tablet Take 25 mg by mouth every morning.    Yes Historical Provider, MD  levocetirizine (XYZAL) 5 MG tablet Take 5 mg by mouth every evening.   Yes  Historical Provider, MD  levothyroxine (SYNTHROID, LEVOTHROID) 88 MCG tablet 88 mcg daily. 10/26/13  Yes Historical Provider, MD  lisinopril (PRINIVIL,ZESTRIL) 2.5 MG tablet Take 2.5 mg by mouth daily.    Yes Historical Provider, MD  loperamide (IMODIUM) 2 MG capsule Take 2 mg by mouth as needed for diarrhea or loose stools.   Yes Historical Provider, MD  metFORMIN (GLUCOPHAGE-XR) 500 MG 24 hr tablet Take 500 mg by mouth daily with breakfast.    Yes Historical Provider, MD  montelukast (SINGULAIR) 10 MG tablet Take 10 mg by mouth at bedtime.   Yes Historical Provider, MD  Multiple Vitamin (MULTIVITAMIN WITH MINERALS) TABS tablet Take 1 tablet by mouth daily.   Yes Historical Provider, MD  Omega-3 Fatty Acids (FISH OIL) 500 MG CAPS Take 2 capsules by mouth daily.    Yes Historical Provider, MD  Pancrelipase, Lip-Prot-Amyl, (ZENPEP) 20000 UNITS CPEP Take 1 capsule (20,000 Units total) by mouth 3 (three) times daily before meals. 12/21/14  Yes Danie Binder, MD  potassium chloride SA (K-DUR,KLOR-CON) 20 MEQ tablet Take 30 mEq by mouth daily.    Yes Historical Provider, MD  ranitidine (ZANTAC) 150 MG tablet Take 150 mg by mouth 2 (two) times daily.   Yes Historical Provider, MD  ZIOPTAN 0.0015 % SOLN Place 1 drop into both eyes at bedtime.  10/11/13  Yes Historical Provider, MD   Physical Exam: Filed Vitals:   01/15/15 1530 01/15/15 1600 01/15/15 1615 01/15/15 1630  BP: 166/72 145/81  137/105  Pulse:   47   Temp:      TempSrc:      Resp:  13 14   Height:      Weight:      SpO2:   99%     Wt Readings from Last 3 Encounters:  01/15/15 63.504 kg (140 lb)  12/21/14 67.132 kg (148 lb)  07/24/14 63.504 kg (140 lb)    General:  Appears calm and comfortable. She is not in acute pain. She is laying on her left side and as long as she remains still, she is not in any pain. Eyes: PERRL, normal lids, irises & conjunctiva ENT: grossly normal hearing, lips & tongue Neck: no LAD, masses or  thyromegaly Cardiovascular: RRR, no m/r/g. No LE edema. Telemetry: SR, no arrhythmias  Respiratory: CTA bilaterally, no w/r/r. Normal respiratory effort. Abdomen: soft, ntnd Skin: no rash or induration seen on limited exam Musculoskeletal: She would not let me examine her left hip because it was going to be painful to move. Psychiatric: grossly normal mood and affect, speech fluent and appropriate Neurologic: grossly non-focal.          Labs on Admission:  Basic Metabolic Panel:  Recent Labs Lab 01/15/15 1545  NA 137  K 3.4*  CL 101  CO2 31  GLUCOSE 103*  BUN 11  CREATININE 0.53  CALCIUM 9.3   Liver Function Tests: No results for input(s): AST, ALT, ALKPHOS, BILITOT, PROT, ALBUMIN in the last 168 hours. No results for input(s): LIPASE, AMYLASE in the last 168 hours. No results for input(s): AMMONIA in the last 168 hours. CBC:  Recent Labs Lab 01/15/15 1517  WBC 7.6  NEUTROABS 5.3  HGB 13.2  HCT 39.2  MCV 89.5  PLT 189   Cardiac Enzymes:  Recent Labs Lab 01/15/15 1545  TROPONINI <0.03    BNP (last 3 results) No results for input(s): BNP in the last 8760 hours.  ProBNP (last 3 results) No results for input(s): PROBNP in the last 8760 hours.  CBG: No results for input(s): GLUCAP in the last 168 hours.  Radiological Exams on Admission: Ct Hip Left Wo Contrast  01/15/2015  CLINICAL DATA:  Left hip pain.  Status post fall. EXAM: CT OF THE LEFT HIP WITHOUT CONTRAST TECHNIQUE: Multidetector CT imaging of the left hip was performed according to the standard protocol. Multiplanar CT image reconstructions were also generated. COMPARISON:  None. FINDINGS: There is generalized osteopenia. There is a mildly impacted left subcapital femoral fracture. There is no aggressive lytic or sclerotic osseous lesion. There is mild osteoarthritis of the left hip. The left superior and inferior pubic rami are intact. There is no fluid collection or hematoma.  The muscles are  normal. There is no inguinal lymphadenopathy.  There is no inguinal hernia. IMPRESSION: 1. Nondisplaced, mildly impacted left subcapital femoral fracture. Electronically Signed   By: Kathreen Devoid   On: 01/15/2015 15:57   Dg Chest Port 1 View  01/15/2015  CLINICAL DATA:  81 year old female status post fall with hip fracture. Initial encounter. EXAM: PORTABLE CHEST 1 VIEW COMPARISON:  08/30/2014. FINDINGS: Portable AP semi upright view at 1430 hours. Stable lung volumes. Normal cardiac size and mediastinal contours. Allowing for portable technique, the lungs are clear. No pleural effusion or pulmonary edema. IMPRESSION: No acute cardiopulmonary abnormality. Electronically Signed   By: [name] M.D.   On: 01/15/2015 14:46   Dg Knee Complete 4 Views Left  01/15/2015  CLINICAL DATA:  Left knee pain status post fall. EXAM: LEFT KNEE - COMPLETE 4+ VIEW COMPARISON:  None. FINDINGS: There is no evidence of fracture, dislocation, or joint effusion. There is mild 3 compartment osteoarthritis. There is generalized osteopenia. Soft tissues are unremarkable. IMPRESSION: Generalized osteopenia without evidence of fracture about the left knee. Mild 3 compartment osteoarthritis. Electronically Signed   By: [name] M.D.   On: 01/15/2015 14:00   Dg Hip Unilat With Pelvis 2-3 Views Left  01/15/2015  CLINICAL DATA:  Left upper leg pain due to fall on Saturday night. EXAM: DG HIP (WITH OR WITHOUT PELVIS) 2-3V LEFT COMPARISON:  None. FINDINGS: There is foreshortening of the left femoral neck with lucency across femoral neck compatible with left femoral neck fracture. No angulation. No subluxation or dislocation. Mild degenerative changes within the left hip. IMPRESSION: Foreshortening an lucency across the left femoral neck concerning for left femoral neck fracture. Electronically Signed   By: [name] M.D.   On: 01/15/2015 13:58    EKG: Independently reviewed. Sinus rhythm without any acute ST-T wave  changes.  Assessment/Plan   1. Left hip fracture. The orthopedic surgeon at [facility] has been contacted and is willing to most likely operate on her this evening. The patient will be transferred  this evening to the emergency room at [facility] for further management. She'll be kept nothing by mouth. 2. Hypertension. Stable. Intravenous hydralazine when necessary for hypertension. 3. Diabetes. Sliding scale of insulin. 4. Hypokalemia. Replete intravenously.  The patient is medically stable to undergo surgery this evening. Further recommendations will depend on patient's hospital progress and she will be transferred to [facility] today.   Code Status: Full code.  DVT Prophylaxis: SCDs.  Family Communication: I discussed the plan with the patient at the bedside.  Disposition Plan: Depending on progress.  Time spent: 60 minutes.  Doree Albee [facility] Pager [phone].

## 2015-01-15 NOTE — Op Note (Signed)
01/15/2015  7:55 PM  PATIENT:  Kathleen [name]    PRE-OPERATIVE DIAGNOSIS:  Left Hip Fx  POST-OPERATIVE DIAGNOSIS:  Same  PROCEDURE:  CANNULATED HIP PINNING/LEFT  SURGEON:  Nefertari Rebman D, MD  ASSISTANT: none  ANESTHESIA:   General  PREOPERATIVE INDICATIONS:  Kathleen [name] is a  81 y.o. female who fell and was found to have a diagnosis of Left Hip Fx who elected for surgical management.    The risks benefits and alternatives were discussed with the patient preoperatively including but not limited to the risks of infection, bleeding, nerve injury, cardiopulmonary complications, blood clots, malunion, nonunion, avascular necrosis, the need for revision surgery, the potential for conversion to hemiarthroplasty, among others, and the patient was willing to proceed.  OPERATIVE IMPLANTS: 6.5 mm cannulated screws x3  OPERATIVE FINDINGS: Clinical osteoporosis with weak bone, proximal femur  OPERATIVE PROCEDURE: The patient was brought to the operating room and placed in supine position. IV antibiotics were given. General anesthesia administered. Foley was also given. The patient was placed on the fracture table. The operative extremity was positioned, without any significant reduction maneuver and was prepped and draped in usual sterile fashion.  Time out was performed.  Small incisions were made distal to the greater trochanter, and 3 guidewires were introduced Into an inverted triangle configuration. The lengths were measured. The reduction was slightly valgus, and near-anatomic. I opened the cortex with a cannulated drill, and then placed the screws into position. Satisfactory fixation was achieved. I sequentially tightened the screws by hand.  I performed a live fluoroscopic exam and no screw penetrance was noted. All threads crossed the fracture site.   The wounds were irrigated copiously, and repaired with Vicryl with Steri-Strips and sterile gauze. There no complications and the patient  tolerated the procedure well.  The patient will be weightbearing as tolerated, VTE prophylaxis will be: ASA 325   This note was generated using a template and dragon dictation system. In light of that, I have reviewed the note and all aspects of it are applicable to this case. Any dictation errors are due to the computerized dictation system.

## 2015-01-15 NOTE — ED Provider Notes (Signed)
Patient transferred from [facility] with a hip fracture. Currently waiting on orthopedics, Dr. Percell Miller, to see patient and wait for the OR to be ready. Patient to be admitted to the hospitalist.  Sherwood Gambler, MD 01/15/15 [phone]

## 2015-01-15 NOTE — Consult Note (Signed)
ORTHOPAEDIC CONSULTATION  REQUESTING PHYSICIAN: Sherwood Gambler, MD  Chief Complaint: left hip fracture  HPI: Kathleen [name] is a 81 y.o. female who complains of  a mechanical fall onto her left side and she was trying to throw a pillow. Prior to this she ambulated with no assistive devices in the community.  Past Medical History  Diagnosis Date  . Hypertension   . High cholesterol   . Diabetes mellitus without complication ([place])     "borderline"  . Hypothyroidism   . Diverticulosis of colon   . Arthritis   . Pancreatitis    Past Surgical History  Procedure Laterality Date  . Appendectomy    . Vein surgery    . Cholecystectomy  1999    cholelithiasis  . Bladder tac    . Abdominal hysterectomy      partial  . Excision of breast biopsy    . Colonoscopy  01/2007    Dr Olevia Perches: normal  . Colonoscopy  12/2000    Dr Deatra Ina- diverticulosis  . Esophagogastroduodenoscopy      Dr Damaris Hippo cannot remember  . Eus N/A 03/24/2012    Dr. Paulita Fujita: suspected passed CBD stone as cause of acute pancreatitis, suspective chronic pancreatitis.    Social History   Social History  . Marital Status: Married    Spouse Name: N/A  . Number of Children: 1  . Years of Education: N/A   Occupational History  . retired, tobacco field    Social History Main Topics  . Smoking status: Never Smoker   . Smokeless tobacco: Never Used  . Alcohol Use: No  . Drug Use: No  . Sexual Activity: Not Asked   Other Topics Concern  . None   Social History Narrative   Lost 1 son age 39-?MI   1 living son         Family History  Problem Relation Age of Onset  . Diabetes Father   . Diabetes Mother   . Cancer Mother     ovarian  . Cancer Sister 47    breast  . Cancer Sister 91    bone   Allergies  Allergen Reactions  . Alendronate     Unknown reaction  . Cephalexin     Unknown reaction  . Clindamycin/Lincomycin Hives  . Creon [Pancreatin] Hives  . Doxycycline     Unknown reaction    . Florastor [Yeast] Hives  . Penicillins     Has patient had a PCN reaction causing immediate rash, facial/tongue/throat swelling, SOB or lightheadedness with hypotension: unknown Has patient had a PCN reaction causing severe rash involving mucus membranes or skin necrosis: unknown Has patient had a PCN reaction that required hospitalization unknown Has patient had a PCN reaction occurring within the last 10 years:unknown If all of the above answers are "NO", then may proceed with Cephalosporin use.   Marland Kitchen Zenpep [Pancrelipase (Lip-Prot-Amyl)] Hives   Prior to Admission medications   Medication Sig Start Date End Date Taking? Authorizing Provider  amLODipine (NORVASC) 5 MG tablet Take 5 mg by mouth every morning.    Yes Historical Provider, MD  aspirin EC 81 MG tablet Take 81 mg by mouth at bedtime.    Yes Historical Provider, MD  Calcium Carbonate (CALTRATE 600 PO) Take 600 tablets by mouth 2 (two) times daily.    Yes Historical Provider, MD  carboxymethylcellulose (REFRESH PLUS) 0.5 % SOLN 1 drop 3 (three) times daily as needed.   Yes Historical Provider, MD  COSOPT PF 22.3-6.8 MG/ML SOLN Place 1 drop into both eyes 2 (two) times daily.  10/11/13  Yes Historical Provider, MD  cyanocobalamin (,VITAMIN B-12,) 1000 MCG/ML injection Inject 1,000 mcg into the muscle every 30 (thirty) days.   Yes Historical Provider, MD  fluticasone (FLONASE) 50 MCG/ACT nasal spray Place 1 spray into both nostrils daily.   Yes Historical Provider, MD  hydrochlorothiazide (HYDRODIURIL) 25 MG tablet Take 25 mg by mouth every morning.    Yes Historical Provider, MD  levocetirizine (XYZAL) 5 MG tablet Take 5 mg by mouth every evening.   Yes Historical Provider, MD  levothyroxine (SYNTHROID, LEVOTHROID) 88 MCG tablet 88 mcg daily. 10/26/13  Yes Historical Provider, MD  lisinopril (PRINIVIL,ZESTRIL) 2.5 MG tablet Take 2.5 mg by mouth daily.    Yes Historical Provider, MD  loperamide (IMODIUM) 2 MG capsule Take 2 mg by mouth  as needed for diarrhea or loose stools.   Yes Historical Provider, MD  metFORMIN (GLUCOPHAGE-XR) 500 MG 24 hr tablet Take 500 mg by mouth daily with breakfast.    Yes Historical Provider, MD  montelukast (SINGULAIR) 10 MG tablet Take 10 mg by mouth at bedtime.   Yes Historical Provider, MD  Multiple Vitamin (MULTIVITAMIN WITH MINERALS) TABS tablet Take 1 tablet by mouth daily.   Yes Historical Provider, MD  Omega-3 Fatty Acids (FISH OIL) 500 MG CAPS Take 2 capsules by mouth daily.    Yes Historical Provider, MD  Pancrelipase, Lip-Prot-Amyl, (ZENPEP) 20000 UNITS CPEP Take 1 capsule (20,000 Units total) by mouth 3 (three) times daily before meals. 12/21/14  Yes Danie Binder, MD  potassium chloride SA (K-DUR,KLOR-CON) 20 MEQ tablet Take 30 mEq by mouth daily.    Yes Historical Provider, MD  ranitidine (ZANTAC) 150 MG tablet Take 150 mg by mouth 2 (two) times daily.   Yes Historical Provider, MD  ZIOPTAN 0.0015 % SOLN Place 1 drop into both eyes at bedtime.  10/11/13  Yes Historical Provider, MD   Ct Hip Left Wo Contrast  01/15/2015  CLINICAL DATA:  Left hip pain.  Status post fall. EXAM: CT OF THE LEFT HIP WITHOUT CONTRAST TECHNIQUE: Multidetector CT imaging of the left hip was performed according to the standard protocol. Multiplanar CT image reconstructions were also generated. COMPARISON:  None. FINDINGS: There is generalized osteopenia. There is a mildly impacted left subcapital femoral fracture. There is no aggressive lytic or sclerotic osseous lesion. There is mild osteoarthritis of the left hip. The left superior and inferior pubic rami are intact. There is no fluid collection or hematoma.  The muscles are normal. There is no inguinal lymphadenopathy.  There is no inguinal hernia. IMPRESSION: 1. Nondisplaced, mildly impacted left subcapital femoral fracture. Electronically Signed   By: Kathreen Devoid   On: 01/15/2015 15:57   Dg Chest Port 1 View  01/15/2015  CLINICAL DATA:  81 year old female status  post fall with hip fracture. Initial encounter. EXAM: PORTABLE CHEST 1 VIEW COMPARISON:  08/30/2014. FINDINGS: Portable AP semi upright view at 1430 hours. Stable lung volumes. Normal cardiac size and mediastinal contours. Allowing for portable technique, the lungs are clear. No pleural effusion or pulmonary edema. IMPRESSION: No acute cardiopulmonary abnormality. Electronically Signed   By: [name] M.D.   On: 01/15/2015 14:46   Dg Knee Complete 4 Views Left  01/15/2015  CLINICAL DATA:  Left knee pain status post fall. EXAM: LEFT KNEE - COMPLETE 4+ VIEW COMPARISON:  None. FINDINGS: There is no evidence of fracture, dislocation, or joint effusion.  There is mild 3 compartment osteoarthritis. There is generalized osteopenia. Soft tissues are unremarkable. IMPRESSION: Generalized osteopenia without evidence of fracture about the left knee. Mild 3 compartment osteoarthritis. Electronically Signed   By: [name] M.D.   On: 01/15/2015 14:00   Dg Hip Unilat With Pelvis 2-3 Views Left  01/15/2015  CLINICAL DATA:  Left upper leg pain due to fall on Saturday night. EXAM: DG HIP (WITH OR WITHOUT PELVIS) 2-3V LEFT COMPARISON:  None. FINDINGS: There is foreshortening of the left femoral neck with lucency across femoral neck compatible with left femoral neck fracture. No angulation. No subluxation or dislocation. Mild degenerative changes within the left hip. IMPRESSION: Foreshortening an lucency across the left femoral neck concerning for left femoral neck fracture. Electronically Signed   By: [name] M.D.   On: 01/15/2015 13:58    Positive ROS: All other systems have been reviewed and were otherwise negative with the exception of those mentioned in the HPI and as above.  Labs cbc  Recent Labs  01/15/15 1517  WBC 7.6  HGB 13.2  HCT 39.2  PLT 189    Labs inflam No results for input(s): CRP in the last 72 hours.  Invalid input(s): ESR  Labs coag  Recent Labs  01/15/15 1545  INR 1.15       Recent Labs  01/15/15 1545  NA 137  K 3.4*  CL 101  CO2 31  GLUCOSE 103*  BUN 11  CREATININE 0.53  CALCIUM 9.3    Physical Exam: Filed Vitals:   01/15/15 1744 01/15/15 1839  BP: 140/70 160/61  Pulse: 85 77  Temp: 97.5 F (36.4 C) 97.6 F (36.4 C)  Resp: 18 16   General: Alert, no acute distress Cardiovascular: No pedal edema Respiratory: No cyanosis, no use of accessory musculature GI: No organomegaly, abdomen is soft and non-tender Skin: No lesions in the area of chief complaint other than those listed below in MSK exam.  Neurologic: Sensation intact distally Psychiatric: Patient is competent for consent with normal mood and affect Lymphatic: No axillary or cervical lymphadenopathy  MUSCULOSKELETAL:  Left lower extremity compartments are soft skin is benign she is at her baseline neurovascular status. Other extremities are atraumatic with painless ROM and NVI.  Assessment: Left basicervical impaction fracture  Plan: Left hip pinning   Renette Butters, MD Cell [phone]   01/15/2015 7:53 PM

## 2015-01-15 NOTE — ED Notes (Signed)
Pt c/o left upper leg pain due to fall on Sat. night around 2100. Pt denies hitting her head and LOC.

## 2015-01-16 ENCOUNTER — Encounter (HOSPITAL_COMMUNITY): Payer: Self-pay | Admitting: Orthopedic Surgery

## 2015-01-16 DIAGNOSIS — Z883 Allergy status to other anti-infective agents status: Secondary | ICD-10-CM | POA: Diagnosis not present

## 2015-01-16 DIAGNOSIS — Z803 Family history of malignant neoplasm of breast: Secondary | ICD-10-CM | POA: Diagnosis not present

## 2015-01-16 DIAGNOSIS — E119 Type 2 diabetes mellitus without complications: Secondary | ICD-10-CM | POA: Diagnosis present

## 2015-01-16 DIAGNOSIS — E109 Type 1 diabetes mellitus without complications: Secondary | ICD-10-CM | POA: Diagnosis not present

## 2015-01-16 DIAGNOSIS — Z881 Allergy status to other antibiotic agents status: Secondary | ICD-10-CM | POA: Diagnosis not present

## 2015-01-16 DIAGNOSIS — I10 Essential (primary) hypertension: Secondary | ICD-10-CM | POA: Diagnosis present

## 2015-01-16 DIAGNOSIS — W010XXA Fall on same level from slipping, tripping and stumbling without subsequent striking against object, initial encounter: Secondary | ICD-10-CM | POA: Diagnosis present

## 2015-01-16 DIAGNOSIS — M810 Age-related osteoporosis without current pathological fracture: Secondary | ICD-10-CM | POA: Diagnosis present

## 2015-01-16 DIAGNOSIS — Z880 Allergy status to penicillin: Secondary | ICD-10-CM | POA: Diagnosis not present

## 2015-01-16 DIAGNOSIS — Z808 Family history of malignant neoplasm of other organs or systems: Secondary | ICD-10-CM | POA: Diagnosis not present

## 2015-01-16 DIAGNOSIS — T148 Other injury of unspecified body region: Secondary | ICD-10-CM | POA: Diagnosis not present

## 2015-01-16 DIAGNOSIS — Z833 Family history of diabetes mellitus: Secondary | ICD-10-CM | POA: Diagnosis not present

## 2015-01-16 DIAGNOSIS — S72012A Unspecified intracapsular fracture of left femur, initial encounter for closed fracture: Secondary | ICD-10-CM | POA: Diagnosis present

## 2015-01-16 DIAGNOSIS — E039 Hypothyroidism, unspecified: Secondary | ICD-10-CM | POA: Diagnosis present

## 2015-01-16 DIAGNOSIS — E7800 Pure hypercholesterolemia, unspecified: Secondary | ICD-10-CM | POA: Diagnosis present

## 2015-01-16 DIAGNOSIS — S72002A Fracture of unspecified part of neck of left femur, initial encounter for closed fracture: Secondary | ICD-10-CM | POA: Diagnosis not present

## 2015-01-16 DIAGNOSIS — E876 Hypokalemia: Secondary | ICD-10-CM | POA: Diagnosis present

## 2015-01-16 DIAGNOSIS — S72009A Fracture of unspecified part of neck of unspecified femur, initial encounter for closed fracture: Secondary | ICD-10-CM | POA: Diagnosis present

## 2015-01-16 DIAGNOSIS — Y92019 Unspecified place in single-family (private) house as the place of occurrence of the external cause: Secondary | ICD-10-CM | POA: Diagnosis not present

## 2015-01-16 DIAGNOSIS — E038 Other specified hypothyroidism: Secondary | ICD-10-CM | POA: Diagnosis not present

## 2015-01-16 DIAGNOSIS — Z8041 Family history of malignant neoplasm of ovary: Secondary | ICD-10-CM | POA: Diagnosis not present

## 2015-01-16 DIAGNOSIS — Z7984 Long term (current) use of oral hypoglycemic drugs: Secondary | ICD-10-CM | POA: Diagnosis not present

## 2015-01-16 DIAGNOSIS — S72002S Fracture of unspecified part of neck of left femur, sequela: Secondary | ICD-10-CM | POA: Diagnosis not present

## 2015-01-16 DIAGNOSIS — Z7982 Long term (current) use of aspirin: Secondary | ICD-10-CM | POA: Diagnosis not present

## 2015-01-16 DIAGNOSIS — M25562 Pain in left knee: Secondary | ICD-10-CM | POA: Diagnosis present

## 2015-01-16 DIAGNOSIS — Z888 Allergy status to other drugs, medicaments and biological substances status: Secondary | ICD-10-CM | POA: Diagnosis not present

## 2015-01-16 LAB — GLUCOSE, CAPILLARY
GLUCOSE-CAPILLARY: 116 mg/dL — AB (ref 65–99)
GLUCOSE-CAPILLARY: 133 mg/dL — AB (ref 65–99)
GLUCOSE-CAPILLARY: 155 mg/dL — AB (ref 65–99)
GLUCOSE-CAPILLARY: 91 mg/dL (ref 65–99)
Glucose-Capillary: 138 mg/dL — ABNORMAL HIGH (ref 65–99)
Glucose-Capillary: 145 mg/dL — ABNORMAL HIGH (ref 65–99)

## 2015-01-16 MED ORDER — FAMOTIDINE 20 MG PO TABS
20.0000 mg | ORAL_TABLET | Freq: Two times a day (BID) | ORAL | Status: DC
  Administered 2015-01-16 – 2015-01-17 (×2): 20 mg via ORAL
  Filled 2015-01-16 (×2): qty 1

## 2015-01-16 MED ORDER — DORZOLAMIDE HCL-TIMOLOL MAL 2-0.5 % OP SOLN
1.0000 [drp] | Freq: Two times a day (BID) | OPHTHALMIC | Status: DC
  Administered 2015-01-16 – 2015-01-17 (×2): 1 [drp] via OPHTHALMIC
  Filled 2015-01-16: qty 10

## 2015-01-16 MED ORDER — AMLODIPINE BESYLATE 5 MG PO TABS
5.0000 mg | ORAL_TABLET | Freq: Every morning | ORAL | Status: DC
  Administered 2015-01-17: 5 mg via ORAL
  Filled 2015-01-16: qty 1

## 2015-01-16 MED ORDER — POLYVINYL ALCOHOL 1.4 % OP SOLN
1.0000 [drp] | Freq: Three times a day (TID) | OPHTHALMIC | Status: DC | PRN
  Filled 2015-01-16: qty 15

## 2015-01-16 MED ORDER — BUPIVACAINE HCL (PF) 0.5 % IJ SOLN
INTRAMUSCULAR | Status: DC | PRN
  Administered 2015-01-15: 2.5 mL via INTRATHECAL

## 2015-01-16 MED ORDER — CARBOXYMETHYLCELLULOSE SODIUM 0.5 % OP SOLN: 1.0000 [drp] | Freq: Three times a day (TID) | OPHTHALMIC | Status: DC | PRN

## 2015-01-16 MED ORDER — DORZOLAMIDE HCL-TIMOLOL MAL PF 22.3-6.8 MG/ML OP SOLN: 1.0000 [drp] | Freq: Two times a day (BID) | OPHTHALMIC | Status: DC

## 2015-01-16 MED ORDER — PANCRELIPASE (LIP-PROT-AMYL) 12000-38000 UNITS PO CPEP: 20000.0000 [IU] | ORAL_CAPSULE | Freq: Three times a day (TID) | ORAL | Status: DC

## 2015-01-16 MED ORDER — PANCRELIPASE (LIP-PROT-AMYL) 12000-38000 UNITS PO CPEP
24000.0000 [IU] | ORAL_CAPSULE | Freq: Three times a day (TID) | ORAL | Status: DC
  Administered 2015-01-17: 24000 [IU] via ORAL
  Filled 2015-01-16 (×4): qty 2

## 2015-01-16 MED ORDER — LEVOTHYROXINE SODIUM 88 MCG PO TABS
88.0000 ug | ORAL_TABLET | Freq: Every day | ORAL | Status: DC
  Administered 2015-01-17: 88 ug via ORAL
  Filled 2015-01-16 (×2): qty 1

## 2015-01-16 MED ORDER — FLUTICASONE PROPIONATE 50 MCG/ACT NA SUSP
1.0000 | Freq: Every day | NASAL | Status: DC
  Administered 2015-01-17: 1 via NASAL
  Filled 2015-01-16: qty 16

## 2015-01-16 MED ORDER — HYDROCODONE-ACETAMINOPHEN 5-325 MG PO TABS: 1.0000 | ORAL_TABLET | Freq: Four times a day (QID) | ORAL | Status: DC | PRN

## 2015-01-16 MED ORDER — MONTELUKAST SODIUM 10 MG PO TABS
10.0000 mg | ORAL_TABLET | Freq: Every day | ORAL | Status: DC
  Administered 2015-01-16: 10 mg via ORAL
  Filled 2015-01-16: qty 1

## 2015-01-16 NOTE — Evaluation (Signed)
Occupational Therapy Evaluation Patient Details Name: Kathleen [name] MRN: [id number] DOB: 05/17/1931 Today's Date: 01/16/2015    History of Present Illness Pt admitted after fall with left hip fx s/p pinning. PMhx: HTN, hypothyroidism, borderline DM   Clinical Impression   Pt s/p above. Pt independent with ADLs, PTA. Feel pt will benefit from acute OT to increase independence prior to d/c. Recommending SNF at this time unless pt can work out adequate help at home (sounds like spouse is not in great health). If adequate assist available at home, pt may be able to go home with [facility].    Follow Up Recommendations  SNF;Supervision - Intermittent    Equipment Recommendations  None recommended by OT    Recommendations for Other Services       Precautions / Restrictions Precautions Precautions: Fall Restrictions Weight Bearing Restrictions: Yes LLE Weight Bearing: Weight bearing as tolerated      Mobility Bed Mobility Overal bed mobility: Needs Assistance Bed Mobility: Supine to Sit;Sit to Supine     Supine to sit: Min assist Sit to supine: Supervision   General bed mobility comments: assist with trunk to come to sitting position. Trendelenburg position used to assist with scooting HOB.  Transfers Overall transfer level: Needs assistance Equipment used: Rolling walker (2 wheeled) Transfers: Sit to/from Stand Sit to Stand: Min guard              Balance    pt able to perform functional task while standing and OT provided min guard assist.                                         ADL Overall ADL's : Needs assistance/impaired                     Lower Body Dressing: Min guard;Sit to/from stand   Toilet Transfer: Min guard;Ambulation;RW;BSC   Toileting- Water quality scientist and Hygiene: Min guard;Sit to/from stand       Functional mobility during ADLs: Min guard;Rolling walker General ADL Comments: Educated on LB dressing technique.  Pt able to reach to don/doff both socks.     Vision  Pt wears glasses.   Perception     Praxis      Pertinent Vitals/Pain Pain Assessment: 0-10 Pain Score: 5  Pain Location: LLE Pain Intervention(s): Ice applied;Monitored during session;Repositioned     Hand Dominance     Extremity/Trunk Assessment Upper Extremity Assessment Upper Extremity Assessment: Overall WFL for tasks assessed   Lower Extremity Assessment Lower Extremity Assessment: Defer to PT evaluation       Communication Communication Communication: HOH   Cognition Arousal/Alertness: Awake/alert Behavior During Therapy: WFL for tasks assessed/performed Overall Cognitive Status: Within Functional Limits for tasks assessed                     General Comments       Exercises       Shoulder Instructions      Home Living Family/patient expects to be discharged to:: Unsure Living Arrangements: Spouse/significant other Available Help at Discharge: Family;Available 24 hours/day Type of Home: House Home Access: Stairs to enter [employer] of Steps: 5 Entrance Stairs-Rails: Right Home Layout: One level     Bathroom Shower/Tub: Teacher, early years/pre: Standard     Home Equipment: Environmental consultant - 2 wheels;Bedside commode;Cane - single point;Shower seat  Prior Functioning/Environment Level of Independence: Independent             OT Diagnosis: Acute pain   OT Problem List: Decreased activity tolerance;Decreased knowledge of use of DME or AE;Decreased knowledge of precautions;Pain   OT Treatment/Interventions: Self-care/ADL training;DME and/or AE instruction;Therapeutic activities;Patient/family education;Balance training    OT Goals(Current goals can be found in the care plan section) Acute Rehab OT Goals Patient Stated Goal: go home OT Goal Formulation: With patient Time For Goal Achievement: 01/23/15 Potential to Achieve Goals: Good ADL Goals Pt Will  Perform Lower Body Bathing: with set-up;sit to/from stand Pt Will Perform Lower Body Dressing: with set-up;sit to/from stand Pt Will Transfer to Toilet: with modified independence;ambulating  OT Frequency: Min 2X/week   Barriers to D/C:            Co-evaluation              End of Session Equipment Utilized During Treatment: Gait belt;Rolling walker  Activity Tolerance: Patient tolerated treatment well Patient left: in bed;with call bell/phone within reach;with bed alarm set   Time: 1545-1606 OT Time Calculation (min): 21 min Charges:  OT General Charges $OT Visit: 1 Procedure OT Evaluation $Initial OT Evaluation Tier I: 1 Procedure G-CodesBenito Mccreedy OTR/L [id number] 01/16/2015, 4:24 PM

## 2015-01-16 NOTE — Progress Notes (Signed)
[facility] Progress Note   Kathleen [name]  [id number]  DOB: April 18, 1931  DOA: 01/15/2015 PCP: [name]  Brief narrative: Kathleen [name] is a 81 y.o. female with PMH of HTN, DM2, hypothyroidism who presents to the hospital after a fall a couple of days ago when she tripped. She has been having pain in her hip and knee since the fall. She is found to have a left hip impacted supcapital femoral fracture.    Subjective: S/p surgery yesterday. Ambulating well today. No pain in hip. No other complaints.   Assessment/Plan: Principal Problem:   Hip fracture, left  - per orthopedic surgery- ambulating quite well and PT giving the option of home with home health- however, she may not have enough family support at home - her children are discussing the plans with her husband- will start SNF search in the meantime  Active Problems:   Hypertension - Lisinopril and Norvasc on hold- will re-order with holding parameters    Hypothyroidism - cont synthroid     DM type 2 goal A1C below 7.5 - cont sliding scale    Code Status:     Code Status Orders        Start     Ordered   01/15/15 2324  Full code   Continuous     01/15/15 2323    Advance Directive Documentation        Most Recent Value   Type of Advance Directive  Healthcare Power of Attorney   Pre-existing out of facility DNR order (yellow form or pink MOST form)     "MOST" Form in Place?       Family Communication: daughter   Disposition Plan:  To be determined whether home vs SNF DVT prophylaxis: ASA per ortho Consultants: ortho Procedures: Left hip pinning  Antibiotics: Anti-infectives    Start     Dose/Rate Route Frequency Ordered Stop   01/16/15 0200  ceFAZolin (ANCEF) IVPB 2 g/50 mL premix     2 g 100 mL/hr over 30 Minutes Intravenous Every 6 hours 01/15/15 2323 01/16/15 1115      Objective: Filed Weights   01/15/15 1215  Weight: 63.504 kg (140 lb)    Intake/Output Summary (Last 24  hours) at 01/16/15 1813 Last data filed at 01/16/15 1500  Gross per 24 hour  Intake 1593.75 ml  Output     10 ml  Net 1583.75 ml     Vitals Filed Vitals:   01/15/15 2243 01/16/15 0216 01/16/15 0615 01/16/15 1300  BP: 125/63 149/73 159/59 168/66  Pulse: 77 83 86 85  Temp: 97.4 F (36.3 C) 97.6 F (36.4 C) 98 F (36.7 C) 98.5 F (36.9 C)  TempSrc: Oral Oral Oral Oral  Resp: 16 18 16 16   Height:      Weight:      SpO2: 98% 95% 97% 98%    Exam:  General:  Pt is alert, not in acute distress  HEENT: No icterus, No thrush, oral mucosa moist  Cardiovascular: regular rate and rhythm, S1/S2 No murmur  Respiratory: clear to auscultation bilaterally   Abdomen: Soft, +Bowel sounds, non tender, non distended, no guarding  MSK: No LE edema, cyanosis or clubbing  Data Reviewed: Basic Metabolic Panel:  Recent Labs Lab 01/15/15 1545  NA 137  K 3.4*  CL 101  CO2 31  GLUCOSE 103*  BUN 11  CREATININE 0.53  CALCIUM 9.3   Liver Function Tests: No results for input(s): AST, ALT, ALKPHOS, BILITOT, PROT,  ALBUMIN in the last 168 hours. No results for input(s): LIPASE, AMYLASE in the last 168 hours. No results for input(s): AMMONIA in the last 168 hours. CBC:  Recent Labs Lab 01/15/15 1517  WBC 7.6  NEUTROABS 5.3  HGB 13.2  HCT 39.2  MCV 89.5  PLT 189   Cardiac Enzymes:  Recent Labs Lab 01/15/15 1545  TROPONINI <0.03   BNP (last 3 results) No results for input(s): BNP in the last 8760 hours.  ProBNP (last 3 results) No results for input(s): PROBNP in the last 8760 hours.  CBG:  Recent Labs Lab 01/16/15 0052 01/16/15 0441 01/16/15 0807 01/16/15 1128 01/16/15 1649  GLUCAP 155* 133* 116* 138* 145*    No results found for this or any previous visit (from the past 240 hour(s)).   Studies: Pelvis Portable  01/15/2015  CLINICAL DATA:  Postop left hip EXAM: PORTABLE PELVIS 1-2 VIEWS COMPARISON:  CT left hip dated 01/15/2015 FINDINGS: Three cannulated  cancellous screws transfixing a subcapital left hip fracture. Fracture fragments are in near anatomic alignment and position. Visualized bony pelvis appears intact. IMPRESSION: Status post ORIF of a left hip fracture, as above. Electronically Signed   By: [name] M.D.   On: 01/15/2015 21:33   Ct Hip Left Wo Contrast  01/15/2015  CLINICAL DATA:  Left hip pain.  Status post fall. EXAM: CT OF THE LEFT HIP WITHOUT CONTRAST TECHNIQUE: Multidetector CT imaging of the left hip was performed according to the standard protocol. Multiplanar CT image reconstructions were also generated. COMPARISON:  None. FINDINGS: There is generalized osteopenia. There is a mildly impacted left subcapital femoral fracture. There is no aggressive lytic or sclerotic osseous lesion. There is mild osteoarthritis of the left hip. The left superior and inferior pubic rami are intact. There is no fluid collection or hematoma.  The muscles are normal. There is no inguinal lymphadenopathy.  There is no inguinal hernia. IMPRESSION: 1. Nondisplaced, mildly impacted left subcapital femoral fracture. Electronically Signed   By: Kathreen Devoid   On: 01/15/2015 15:57   Dg Chest Port 1 View  01/15/2015  CLINICAL DATA:  81 year old female status post fall with hip fracture. Initial encounter. EXAM: PORTABLE CHEST 1 VIEW COMPARISON:  08/30/2014. FINDINGS: Portable AP semi upright view at 1430 hours. Stable lung volumes. Normal cardiac size and mediastinal contours. Allowing for portable technique, the lungs are clear. No pleural effusion or pulmonary edema. IMPRESSION: No acute cardiopulmonary abnormality. Electronically Signed   By: [name] M.D.   On: 01/15/2015 14:46   Dg Knee Complete 4 Views Left  01/15/2015  CLINICAL DATA:  Left knee pain status post fall. EXAM: LEFT KNEE - COMPLETE 4+ VIEW COMPARISON:  None. FINDINGS: There is no evidence of fracture, dislocation, or joint effusion. There is mild 3 compartment osteoarthritis. There is  generalized osteopenia. Soft tissues are unremarkable. IMPRESSION: Generalized osteopenia without evidence of fracture about the left knee. Mild 3 compartment osteoarthritis. Electronically Signed   By: [name] M.D.   On: 01/15/2015 14:00   Dg Hip Operative Unilat With Pelvis Left  01/15/2015  CLINICAL DATA:  ORIF left hip EXAM: OPERATIVE left HIP (WITH PELVIS IF PERFORMED) 2 VIEWS TECHNIQUE: Fluoroscopic spot image(s) were submitted for interpretation post-operatively. FLUOROSCOPY TIME:  15 seconds COMPARISON:  Left hip radiographs dated 01/15/2015 FINDINGS: Two fluoroscopic spot images with 3 cannulated cancellous screws transfixing a left hip fracture. IMPRESSION: Intraoperative fluoroscopic spot images, as above. Electronically Signed   By: Henderson Newcomer.D.  On: 01/15/2015 20:46   Dg Hip Unilat With Pelvis 2-3 Views Left  01/15/2015  CLINICAL DATA:  Left upper leg pain due to fall on Saturday night. EXAM: DG HIP (WITH OR WITHOUT PELVIS) 2-3V LEFT COMPARISON:  None. FINDINGS: There is foreshortening of the left femoral neck with lucency across femoral neck compatible with left femoral neck fracture. No angulation. No subluxation or dislocation. Mild degenerative changes within the left hip. IMPRESSION: Foreshortening an lucency across the left femoral neck concerning for left femoral neck fracture. Electronically Signed   By: [name] M.D.   On: 01/15/2015 13:58    Scheduled Meds:  Scheduled Meds: . aspirin EC  325 mg Oral Q breakfast  . insulin aspart  0-15 Units Subcutaneous Q4H   Continuous Infusions: . 0.9 % NaCl with KCl 20 mEq / L 75 mL/hr at 01/15/15 1727    Time spent on care of this patient: 28 min   [place], MD 01/16/2015, 6:13 PM  LOS: 1 day   [facility] Office  [phone] Pager - Text Page per [email or web address] If 7PM-7AM, please contact night-coverage [email or web address]

## 2015-01-16 NOTE — Care Management (Signed)
Utilization review completed. Chiyeko Ferre, RN Case Manager [phone]. 

## 2015-01-16 NOTE — Anesthesia Postprocedure Evaluation (Signed)
Anesthesia Post Note  Patient: Kathleen [name]  Procedure(s) Performed: Procedure(s) (LRB): CANNULATED HIP PINNING/LEFT (Left)  Patient location during evaluation: PACU Anesthesia Type: Spinal Level of consciousness: oriented and awake and alert Pain management: pain level controlled Vital Signs Assessment: post-procedure vital signs reviewed and stable Respiratory status: spontaneous breathing and respiratory function stable Cardiovascular status: blood pressure returned to baseline and stable Postop Assessment: no headache, no backache and spinal receding Anesthetic complications: no    Last Vitals:  Filed Vitals:   01/16/15 0216 01/16/15 0615  BP: 149/73 159/59  Pulse: 83 86  Temp: 36.4 C 36.7 C  Resp: 18 16    Last Pain:  Filed Vitals:   01/16/15 0615  PainSc: [place]

## 2015-01-16 NOTE — Progress Notes (Signed)
     Subjective:  POD#1 L hip pinning for fx. Patient reports pain as mild to moderate.  Patient had decreased mobility with PT.  PT recommend assistance at home or SNF.  Family would like SNF.    Objective:   VITALS:   Filed Vitals:   01/15/15 2223 01/15/15 2243 01/16/15 0216 01/16/15 0615  BP:  125/63 149/73 159/59  Pulse:  77 83 86  Temp: 98.7 F (37.1 C) 97.4 F (36.3 C) 97.6 F (36.4 C) 98 F (36.7 C)  TempSrc:  Oral Oral Oral  Resp:  16 18 16   Height:      Weight:      SpO2:  98% 95% 97%    Neurologically intact ABD soft Neurovascular intact Sensation intact distally Intact pulses distally Dorsiflexion/Plantar flexion intact Incision: dressing C/D/I   Lab Results  Component Value Date   WBC 7.6 01/15/2015   HGB 13.2 01/15/2015   HCT 39.2 01/15/2015   MCV 89.5 01/15/2015   PLT 189 01/15/2015   BMET    Component Value Date/Time   NA 137 01/15/2015 1545   K 3.4* 01/15/2015 1545   CL 101 01/15/2015 1545   CO2 31 01/15/2015 1545   GLUCOSE 103* 01/15/2015 1545   BUN 11 01/15/2015 1545   CREATININE 0.53 01/15/2015 1545   CALCIUM 9.3 01/15/2015 1545   GFRNONAA >60 01/15/2015 1545   GFRAA >60 01/15/2015 1545     Assessment/Plan: 1 Day Post-Op   Active Problems:   Hypertension   Hip fracture, left (HCC)   Diabetes ([place])   Closed left hip fracture (HCC)   Up with therapy WBAT in the LLE ASA 325mg  daily for DVT prophylaxis   Kathleen [name] 01/16/2015, 10:44 AM Cell [phone]

## 2015-01-16 NOTE — Evaluation (Signed)
Physical Therapy Evaluation Patient Details Name: Kathleen [name] MRN: [id number] DOB: 16-May-1931 Today's Date: 01/16/2015   History of Present Illness  Pt admitted after fall with left hip fx s/p pinning. PMhx: HTN, hypothyroidism, borderline DM  Clinical Impression  Pt pleasant and moving well. She presents with mild weakness in LLE, decreased balance and function who will benefit from acute therapy to increase strength, safety and function with ambulation and transfers to decrease burden of care. Education for HEP, transfers, safety to mobilize with staff and POC all discussed with family and pt. D/C options discussed with family. Family concerned over assist at home with SNF recommended if lack of care at home. If family able to provide assist for ADLs, supervision for mobility and safety then home is a feasible plan with HHPT.     Follow Up Recommendations SNF;Supervision for mobility/OOB    Equipment Recommendations  None recommended by PT    Recommendations for Other Services       Precautions / Restrictions Precautions Precautions: Fall Restrictions LLE Weight Bearing: Weight bearing as tolerated      Mobility  Bed Mobility Overal bed mobility: Modified Independent             General bed mobility comments: use of rail and no physical assist needed  Transfers Overall transfer level: Needs assistance   Transfers: Sit to/from Stand Sit to Stand: Supervision         General transfer comment: cues for hand placement and safety  Ambulation/Gait Ambulation/Gait assistance: Min guard Ambulation Distance (Feet): 100 Feet Assistive device: Rolling walker (2 wheeled) Gait Pattern/deviations: Step-through pattern;Decreased stride length;Trunk flexed   Gait velocity interpretation: Below normal speed for age/gender General Gait Details: cues for posture and position in RW  Stairs            Wheelchair Mobility    Modified Rankin (Stroke Patients Only)        Balance Overall balance assessment: Needs assistance   Sitting balance-Leahy Scale: Good       Standing balance-Leahy Scale: Fair                               Pertinent Vitals/Pain Pain Assessment: 0-10 Pain Score: 3  Pain Location: left hip Pain Descriptors / Indicators: Aching Pain Intervention(s): Limited activity within patient's tolerance;Premedicated before session;Ice applied;Repositioned    Home Living Family/patient expects to be discharged to:: Private residence Living Arrangements: Spouse/significant other Available Help at Discharge: Family;Available 24 hours/day Type of Home: House Home Access: Stairs to enter Entrance Stairs-Rails: Right Entrance Stairs-Number of Steps: 5 Home Layout: One level Home Equipment: Walker - 2 wheels;Bedside commode;Cane - single point;Shower seat      Prior Function Level of Independence: Independent               Hand Dominance        Extremity/Trunk Assessment   Upper Extremity Assessment: Overall WFL for tasks assessed           Lower Extremity Assessment: LLE deficits/detail   LLE Deficits / Details: weakness post op  Cervical / Trunk Assessment: Kyphotic;Other exceptions  Communication   Communication: No difficulties  Cognition Arousal/Alertness: Awake/alert Behavior During Therapy: WFL for tasks assessed/performed Overall Cognitive Status: Within Functional Limits for tasks assessed                      General Comments      Exercises General  Exercises - Lower Extremity Long Arc Quad: AROM;Seated;10 reps;Both Hip Flexion/Marching: AROM;Seated;Both;10 reps      Assessment/Plan    PT Assessment Patient needs continued PT services  PT Diagnosis Difficulty walking;Generalized weakness;Acute pain   PT Problem List Decreased strength;Decreased range of motion;Decreased activity tolerance;Decreased knowledge of use of DME;Decreased balance;Pain;Decreased mobility  PT  Treatment Interventions Gait training;DME instruction;Functional mobility training;Therapeutic activities;Therapeutic exercise;Stair training;Patient/family education   PT Goals (Current goals can be found in the Care Plan section) Acute Rehab PT Goals Patient Stated Goal: return home PT Goal Formulation: With patient/family Time For Goal Achievement: 01/23/15 Potential to Achieve Goals: Good    Frequency Min 3X/week   Barriers to discharge Decreased caregiver support      Co-evaluation               End of Session Equipment Utilized During Treatment: Gait belt Activity Tolerance: Patient tolerated treatment well Patient left: in chair;with call bell/phone within reach;with family/visitor present Nurse Communication: Mobility status    Functional Assessment Tool Used: clinical judgement Functional Limitation: Mobility: Walking and moving around Mobility: Walking and Moving Around Current Status [phone]): At least 1 percent but less than 20 percent impaired, limited or restricted Mobility: Walking and Moving Around Goal Status [phone]): At least 1 percent but less than 20 percent impaired, limited or restricted    Time: 0957-1029 PT Time Calculation (min) (ACUTE ONLY): 32 min   Charges:   PT Evaluation $Initial PT Evaluation Tier I: 1 Procedure PT Treatments $Gait Training: 8-22 mins   PT G Codes:   PT G-Codes **NOT FOR INPATIENT CLASS** Functional Assessment Tool Used: clinical judgement Functional Limitation: Mobility: Walking and moving around Mobility: Walking and Moving Around Current Status [id number]): At least 1 percent but less than 20 percent impaired, limited or restricted Mobility: Walking and Moving Around Goal Status [phone]): At least 1 percent but less than 20 percent impaired, limited or restricted    Melford Aase 01/16/2015, 10:37 AM Elwyn Reach, [place]

## 2015-01-16 NOTE — Clinical Social Work Note (Addendum)
Clinical Social Work Assessment  Patient Details  Name: Kathleen [name] MRN: [id number] Date of Birth: Oct 22, 1931  Date of referral:  01/16/15               Reason for consult:  Facility Placement                Permission sought to share information with:  Family Supports, Customer service manager Permission granted to share information::     Name::     [name], [name] [id number]  Agency::  SNF admissions  Relationship::     Contact Information:     Housing/Transportation Living arrangements for the past 2 months:  Single Family Home Source of Information:  Patient, Spouse Patient Interpreter Needed:  None Criminal Activity/Legal Involvement Pertinent to Current Situation/Hospitalization:  No - Comment as needed Significant Relationships:  Spouse Lives with:  Spouse Do you feel safe going back to the place where you live?  Yes Need for family participation in patient care:  No (Coment)  Care giving concerns: Patient's family are concerned that patient's husband might not be able take care of patient safely and they would like SNF.   Social Worker assessment / plan:  Patient is a pleasant 81 year old female who is married and lives with her husband.  Patient states she would like to go home with home health but is willing to go to SNF for short term rehab if she has to.  Patient states she has never gone to short term rehab before, CSW explained to patient and her husband about what to expect in short term rehab.  Patient was explained role of CSW and process for looking for SNF for short term rehab.  CSW talked to patient and her family about how insurance if approved will pay for SNF placement.  CSW explained to patient that CSW will have to fax clinical information to insurance company and they will determine if she can go to SNF or not.  CSW informed patient and her husband that if she is not approved for SNF then they will have to consider home health, patient and  family are aware.  Patient and family did not have any other questions and are requesting to go to [facility] in [place] if possible.  Employment status:  Retired Nurse, adult PT Recommendations:  [place] / Referral to community resources:     Patient/Family's Response to care:  Patient and family are agreeable to going to SNF for short term rehab.  Patient/Family's Understanding of and Emotional Response to Diagnosis, Current Treatment, and Prognosis:  Patient and family are aware of current diagnosis and treatment plan.  Emotional Assessment Appearance:  Appears stated age Attitude/Demeanor/Rapport:    Affect (typically observed):  Appropriate, Stable, Pleasant Orientation:  Oriented to Place, Oriented to Self, Oriented to  Time, Oriented to Situation Alcohol / Substance use:  Not Applicable Psych involvement (Current and /or in the community):  No (Comment)  Discharge Needs  Concerns to be addressed:  Lack of Support Readmission within the last 30 days:  No Current discharge risk:  Lack of support system Barriers to Discharge:  Insurance Authorization   [name] 01/16/2015, 11:22 PM

## 2015-01-17 ENCOUNTER — Inpatient Hospital Stay
Admission: RE | Admit: 2015-01-17 | Discharge: 2015-02-06 | Disposition: A | Discharge status: Home / Self Care | Payer: Medicare Other | Source: Ambulatory Visit | Attending: Internal Medicine | Admitting: Internal Medicine

## 2015-01-17 DIAGNOSIS — T148XXA Other injury of unspecified body region, initial encounter: Secondary | ICD-10-CM | POA: Insufficient documentation

## 2015-01-17 DIAGNOSIS — S72002A Fracture of unspecified part of neck of left femur, initial encounter for closed fracture: Secondary | ICD-10-CM

## 2015-01-17 DIAGNOSIS — T148 Other injury of unspecified body region: Secondary | ICD-10-CM

## 2015-01-17 DIAGNOSIS — E109 Type 1 diabetes mellitus without complications: Secondary | ICD-10-CM

## 2015-01-17 DIAGNOSIS — E038 Other specified hypothyroidism: Secondary | ICD-10-CM

## 2015-01-17 LAB — GLUCOSE, CAPILLARY
GLUCOSE-CAPILLARY: 121 mg/dL — AB (ref 65–99)
GLUCOSE-CAPILLARY: 99 mg/dL (ref 65–99)
Glucose-Capillary: 108 mg/dL — ABNORMAL HIGH (ref 65–99)
Glucose-Capillary: 119 mg/dL — ABNORMAL HIGH (ref 65–99)

## 2015-01-17 MED ORDER — POTASSIUM CHLORIDE CRYS ER 20 MEQ PO TBCR
40.0000 meq | EXTENDED_RELEASE_TABLET | Freq: Once | ORAL | Status: AC
  Administered 2015-01-17: 40 meq via ORAL
  Filled 2015-01-17: qty 2

## 2015-01-17 NOTE — Progress Notes (Signed)
Physical Therapy Treatment Patient Details Name: Kathleen [name] MRN: [id number] DOB: [birth year] Today's Date: 01/17/2015    History of Present Illness Pt admitted after fall with left hip fx s/p pinning. PMhx: HTN, hypothyroidism, borderline DM    PT Comments    Pt continues to need MIN A with bed mobility and cues for technique with gait. Pt reports she does live with her husband, but he is in "poor health".  At this time, recommend short term rehab to work towards returning to her independent PLOF.  Follow Up Recommendations  SNF;Supervision for mobility/OOB     Equipment Recommendations  None recommended by PT    Recommendations for Other Services       Precautions / Restrictions Precautions Precautions: Fall Restrictions LLE Weight Bearing: Weight bearing as tolerated    Mobility  Bed Mobility Overal bed mobility: Needs Assistance Bed Mobility: Supine to Sit     Supine to sit: Min assist     General bed mobility comments: MIN A for getting trunk upright and use of rail  Transfers Overall transfer level: Needs assistance Equipment used: Rolling walker (2 wheeled) Transfers: Sit to/from Stand Sit to Stand: Min guard         General transfer comment: cues for hand placement as she wants to pull on RW  Ambulation/Gait Ambulation/Gait assistance: Min guard Ambulation Distance (Feet): 100 Feet Assistive device: Rolling walker (2 wheeled) Gait Pattern/deviations: Decreased step length - right;Trunk flexed Gait velocity: decreased   General Gait Details: Cues for increasing step length   Stairs            Wheelchair Mobility    Modified Rankin (Stroke Patients Only)       Balance     Sitting balance-Leahy Scale: Good       Standing balance-Leahy Scale: Fair                      Cognition Arousal/Alertness: Awake/alert Behavior During Therapy: WFL for tasks assessed/performed Overall Cognitive Status: Within Functional Limits  for tasks assessed                      Exercises General Exercises - Lower Extremity Ankle Circles/Pumps: AROM;Both;Supine Quad Sets: Strengthening;10 reps;Both;Supine Gluteal Sets: Strengthening;10 reps;Supine Heel Slides: AROM;Left;10 reps;Supine Hip ABduction/ADduction: AROM;Left;10 reps;Supine    General Comments        Pertinent Vitals/Pain Pain Assessment: 0-10 Pain Score: 3  Pain Location: L groin Pain Intervention(s): Limited activity within patient's tolerance;Monitored during session;Ice applied    Home Living                      Prior Function            PT Goals (current goals can now be found in the care plan section) Acute Rehab PT Goals Patient Stated Goal: go home PT Goal Formulation: With patient/family Time For Goal Achievement: 01/23/15 Potential to Achieve Goals: Good Progress towards PT goals: Progressing toward goals    Frequency  Min 3X/week    PT Plan Current plan remains appropriate    Co-evaluation             End of Session Equipment Utilized During Treatment: Gait belt Activity Tolerance: Patient tolerated treatment well Patient left: in chair;with call bell/phone within reach     Time: 0955-1018 PT Time Calculation (min) (ACUTE ONLY): 23 min  Charges:  $Gait Training: 8-22 mins $Therapeutic Exercise: 8-22 mins  G Codes:      Delvecchio Madole LUBECK 01/17/2015, 10:32 AM

## 2015-01-17 NOTE — Clinical Social Work Placement (Addendum)
   CLINICAL SOCIAL WORK PLACEMENT  NOTE  Date:  01/17/2015  Patient Details  Name: Kathleen [name] MRN: [id number] Date of Birth: 1931-04-06  Clinical Social Work is seeking post-discharge placement for this patient at the [place] level of care (*CSW will initial, date and re-position this form in  chart as items are completed):  Yes   Patient/family provided with [facility] Work Department's list of facilities offering this level of care within the geographic area requested by the patient (or if unable, by the patient's family).  Yes   Patient/family informed of their freedom to choose among providers that offer the needed level of care, that participate in Medicare, Medicaid or managed care program needed by the patient, have an available bed and are willing to accept the patient.  Yes   Patient/family informed of [facility]'s ownership interest in [facility] and [facility], as well as of the fact that they are under no obligation to receive care at these facilities.  PASRR submitted to EDS on 01/16/15     PASRR number received on       Existing PASRR number confirmed on 01/16/15     FL2 transmitted to all facilities in geographic area requested by pt/family on 01/16/15     FL2 transmitted to all facilities within larger geographic area on       Patient informed that his/her managed care company has contracts with or will negotiate with certain facilities, including the following:            Patient/family informed of bed offers received.  Patient chooses bed at       Physician recommends and patient chooses bed at      Patient to be transferred to   on  .  Patient to be transferred to facility by       Patient family notified on   of transfer.  Name of family member notified:        PHYSICIAN Please sign FL2     Additional Comment:    _______________________________________________ Ross Ludwig, LCSWA 01/17/2015,  12:07 AM

## 2015-01-17 NOTE — Discharge Instructions (Signed)
Bear weight as tolerated  Keep incision covered and dry until follow up Hip Fracture A hip fracture is a fracture of the upper part of your thigh bone (femur).  CAUSES A hip fracture is caused by a direct blow to the side of your hip. This is usually the result of a fall but can occur in other circumstances, such as an automobile accident. RISK FACTORS There is an increased risk of hip fractures in people with:  An unsteady walking pattern (gait) and those with conditions that contribute to poor balance, such as Parkinson's disease or dementia.  Osteopenia and osteoporosis.  Cancer that spreads to the leg bones.  Certain metabolic diseases. SYMPTOMS  Symptoms of hip fracture include:  Pain over the injured hip.  Inability to put weight on the leg in which the fracture occurred (although, some patients are able to walk after a hip fracture).  Toes and foot of the affected leg point outward when you lie down. DIAGNOSIS A physical exam can determine if a hip fracture is likely to have occurred. X-ray exams are needed to confirm the fracture and to look for other injuries. The X-ray exam can help to determine the type of hip fracture. Rarely, the fracture is not visible on an X-ray image and a CT scan or MRI will have to be done. TREATMENT  The treatment for a fracture is usually surgery. This means using a screw, nail, or rod to hold the bones in place.  HOME CARE INSTRUCTIONS Take all medicines as directed by your health care provider. SEEK MEDICAL CARE IF: Pain continues, even after taking pain medicine. MAKE SURE YOU:  Understand these instructions.   Will watch your condition.  Will get help right away if you are not doing well or get worse.   This information is not intended to replace advice given to you by your health care provider. Make sure you discuss any questions you have with your health care provider.   Document Released: 01/27/2005 Document Revised: 02/01/2013  Document Reviewed: 09/08/2012 Elsevier Interactive Patient Education [employer].

## 2015-01-17 NOTE — Progress Notes (Addendum)
Patient will discharge to [facility] Anticipated discharge date: 01/17/15 Family notified: pt husband Transportation by husband- will come to hospital around 4pm to transport pt- RNCM aware Report #: [phone]  [place] signing off.  Domenica Reamer, [place] Social Worker [phone]

## 2015-01-17 NOTE — Discharge Summary (Signed)
Physician Discharge Summary  Kathleen [name] [id number] DOB: 02/18/1931 DOA: 01/15/2015  PCP: Kathleen [name]  Admit date: 01/15/2015 Discharge date: 01/17/2015  Recommendations for Outpatient Follow-up:  1. Pt will need to follow up with PCP in 1-2 weeks post discharge 2. Please obtain BMP to evaluate electrolytes and kidney function 3. Please also check CBC to evaluate Hg and Hct levels 4. Please note that pt is on Aspirin 325 mg PO QD for DVT prophylaxis   Discharge Diagnoses:  Principal Problem:   Hip fracture, left ([place]) Active Problems:   Hypertension   Hypothyroidism   DM type 2 goal A1C below 7.5  Discharge Condition: Stable  Diet recommendation: Heart healthy diet discussed in details   History of present illness:  81 y.o. female with PMH of HTN, DM2, hypothyroidism who presented to the hospital after a fall a couple of days PTA when she tripped. She has been having pain in her left hip and left knee since the fall. She was found to have a left hip impacted supcapital femoral fracture.   Assessment/Plan: Principal Problem:  Hip fracture, left  - per orthopedic surgery, underwent left hip pinning, doing well post op - ambulating quite well  - plan to d/c SNF today when bed available  Active Problems:  Hypertension, essential - resume home regimen upon discharge with Norvasc and lisinopril, HCTZ   Hypothyroidism - cont synthroid    DM type 2 goal A1C below 7.5 - cont home medical regimen Metformin   Procedures/Studies: Pelvis Portable  01/15/2015  CLINICAL DATA:  Postop left hip EXAM: PORTABLE PELVIS 1-2 VIEWS COMPARISON:  CT left hip dated 01/15/2015 FINDINGS: Three cannulated cancellous screws transfixing a subcapital left hip fracture. Fracture fragments are in near anatomic alignment and position. Visualized bony pelvis appears intact. IMPRESSION: Status post ORIF of a left hip fracture, as above. Electronically Signed   By: [name] M.D.    On: 01/15/2015 21:33   Ct Hip Left Wo Contrast  01/15/2015  CLINICAL DATA:  Left hip pain.  Status post fall. EXAM: CT OF THE LEFT HIP WITHOUT CONTRAST TECHNIQUE: Multidetector CT imaging of the left hip was performed according to the standard protocol. Multiplanar CT image reconstructions were also generated. COMPARISON:  None. FINDINGS: There is generalized osteopenia. There is a mildly impacted left subcapital femoral fracture. There is no aggressive lytic or sclerotic osseous lesion. There is mild osteoarthritis of the left hip. The left superior and inferior pubic rami are intact. There is no fluid collection or hematoma.  The muscles are normal. There is no inguinal lymphadenopathy.  There is no inguinal hernia. IMPRESSION: 1. Nondisplaced, mildly impacted left subcapital femoral fracture. Electronically Signed   By: Kathreen Devoid   On: 01/15/2015 15:57   Dg Chest Port 1 View  01/15/2015  CLINICAL DATA:  81 year old female status post fall with hip fracture. Initial encounter. EXAM: PORTABLE CHEST 1 VIEW COMPARISON:  08/30/2014. FINDINGS: Portable AP semi upright view at 1430 hours. Stable lung volumes. Normal cardiac size and mediastinal contours. Allowing for portable technique, the lungs are clear. No pleural effusion or pulmonary edema. IMPRESSION: No acute cardiopulmonary abnormality. Electronically Signed   By: [name] M.D.   On: 01/15/2015 14:46   Dg Knee Complete 4 Views Left  01/15/2015  CLINICAL DATA:  Left knee pain status post fall. EXAM: LEFT KNEE - COMPLETE 4+ VIEW COMPARISON:  None. FINDINGS: There is no evidence of fracture, dislocation, or joint effusion. There is mild 3 compartment osteoarthritis.  There is generalized osteopenia. Soft tissues are unremarkable. IMPRESSION: Generalized osteopenia without evidence of fracture about the left knee. Mild 3 compartment osteoarthritis. Electronically Signed   By: [name] M.D.   On: 01/15/2015 14:00   Dg Hip Operative Unilat  With Pelvis Left  01/15/2015  CLINICAL DATA:  ORIF left hip EXAM: OPERATIVE left HIP (WITH PELVIS IF PERFORMED) 2 VIEWS TECHNIQUE: Fluoroscopic spot image(s) were submitted for interpretation post-operatively. FLUOROSCOPY TIME:  15 seconds COMPARISON:  Left hip radiographs dated 01/15/2015 FINDINGS: Two fluoroscopic spot images with 3 cannulated cancellous screws transfixing a left hip fracture. IMPRESSION: Intraoperative fluoroscopic spot images, as above. Electronically Signed   By: [name] M.D.   On: 01/15/2015 20:46   Dg Hip Unilat With Pelvis 2-3 Views Left  01/15/2015  CLINICAL DATA:  Left upper leg pain due to fall on Saturday night. EXAM: DG HIP (WITH OR WITHOUT PELVIS) 2-3V LEFT COMPARISON:  None. FINDINGS: There is foreshortening of the left femoral neck with lucency across femoral neck compatible with left femoral neck fracture. No angulation. No subluxation or dislocation. Mild degenerative changes within the left hip. IMPRESSION: Foreshortening an lucency across the left femoral neck concerning for left femoral neck fracture. Electronically Signed   By: [name] M.D.   On: 01/15/2015 13:58    Discharge Exam: Filed Vitals:   01/16/15 2025 01/17/15 0524  BP: 127/59 140/64  Pulse: 76 69  Temp: 98.2 F (36.8 C) 98.1 F (36.7 C)  Resp: 16 16   Filed Vitals:   01/16/15 0615 01/16/15 1300 01/16/15 2025 01/17/15 0524  BP: 159/59 168/66 127/59 140/64  Pulse: 86 85 76 69  Temp: 98 F (36.7 C) 98.5 F (36.9 C) 98.2 F (36.8 C) 98.1 F (36.7 C)  TempSrc: Oral Oral Oral Oral  Resp: 16 16 16 16   Height:      Weight:      SpO2: 97% 98% 98% 96%    General: Pt is alert, follows commands appropriately, not in acute distress Cardiovascular: Regular rate and rhythm, no rubs, no gallops Respiratory: Clear to auscultation bilaterally, no wheezing, no crackles, no rhonchi Abdominal: Soft, non tender, non distended, bowel sounds +, no guarding  Discharge  Instructions  Discharge Instructions    Diet - low sodium heart healthy    Complete by:  As directed      Increase activity slowly    Complete by:  As directed             Medication List    TAKE these medications        amLODipine 5 MG tablet  Commonly known as:  NORVASC  Take 5 mg by mouth every morning.     aspirin EC 325 MG tablet  Take 1 tablet (325 mg total) by mouth daily.     CALTRATE 600 PO  Take 600 tablets by mouth 2 (two) times daily.     carboxymethylcellulose 0.5 % Soln  Commonly known as:  REFRESH PLUS  1 drop 3 (three) times daily as needed.     COSOPT PF 22.3-6.8 MG/ML Soln  Generic drug:  Dorzolamide HCl-Timolol Mal PF  Place 1 drop into both eyes 2 (two) times daily.     cyanocobalamin 1000 MCG/ML injection  Commonly known as:  (VITAMIN B-12)  Inject 1,000 mcg into the muscle every 30 (thirty) days.     docusate sodium 100 MG capsule  Commonly known as:  COLACE  Take 1 capsule (100 mg total) by mouth  2 (two) times daily. Continue this while taking narcotics to help with bowel movements     Fish Oil 500 MG Caps  Take 2 capsules by mouth daily.     fluticasone 50 MCG/ACT nasal spray  Commonly known as:  FLONASE  Place 1 spray into both nostrils daily.     hydrochlorothiazide 25 MG tablet  Commonly known as:  HYDRODIURIL  Take 25 mg by mouth every morning.     HYDROcodone-acetaminophen 5-325 MG tablet  Commonly known as:  NORCO  Take 1-2 tablets by mouth every 6 (six) hours as needed for moderate pain.     levocetirizine 5 MG tablet  Commonly known as:  XYZAL  Take 5 mg by mouth every evening.     levothyroxine 88 MCG tablet  Commonly known as:  SYNTHROID, LEVOTHROID  88 mcg daily.     lisinopril 2.5 MG tablet  Commonly known as:  PRINIVIL,ZESTRIL  Take 2.5 mg by mouth daily.     loperamide 2 MG capsule  Commonly known as:  IMODIUM  Take 2 mg by mouth as needed for diarrhea or loose stools.     metFORMIN 500 MG 24 hr tablet   Commonly known as:  GLUCOPHAGE-XR  Take 500 mg by mouth daily with breakfast.     montelukast 10 MG tablet  Commonly known as:  SINGULAIR  Take 10 mg by mouth at bedtime.     multivitamin with minerals Tabs tablet  Take 1 tablet by mouth daily.     Pancrelipase (Lip-Prot-Amyl) 20000 UNITS Cpep  Commonly known as:  ZENPEP  Take 1 capsule (20,000 Units total) by mouth 3 (three) times daily before meals.     potassium chloride SA 20 MEQ tablet  Commonly known as:  K-DUR,KLOR-CON  Take 30 mEq by mouth daily.     ranitidine 150 MG tablet  Commonly known as:  ZANTAC  Take 150 mg by mouth 2 (two) times daily.     ZIOPTAN 0.0015 % Soln  Generic drug:  Tafluprost  Place 1 drop into both eyes at bedtime.           Follow-up Information    Follow up with MURPHY, TIMOTHY D, MD In 1 week.   Specialty:  Orthopedic Surgery   Contact information:   [address] [place] [address] [phone]       Follow up with KAPLAN,KRISTEN, PA-C.   Specialty:  Family Medicine   Contact information:   [address] [address] [phone]        The results of significant diagnostics from this hospitalization (including imaging, microbiology, ancillary and laboratory) are listed below for reference.     Microbiology: No results found for this or any previous visit (from the past 240 hour(s)).   Labs: Basic Metabolic Panel:  Recent Labs Lab 01/15/15 1545  NA 137  K 3.4*  CL 101  CO2 31  GLUCOSE 103*  BUN 11  CREATININE 0.53  CALCIUM 9.3   CBC:  Recent Labs Lab 01/15/15 1517  WBC 7.6  NEUTROABS 5.3  HGB 13.2  HCT 39.2  MCV 89.5  PLT 189   Cardiac Enzymes:  Recent Labs Lab 01/15/15 1545  TROPONINI <0.03   CBG:  Recent Labs Lab 01/16/15 1649 01/16/15 2134 01/17/15 0135 01/17/15 0447 01/17/15 0720  GLUCAP 145* 91 121* 108* 99   SIGNED: Time coordinating discharge: 30 minutes  MAGICK-Jacquise Rarick, MD  [facility] 01/17/2015, 9:54 AM Pager [phone]  If 7PM-7AM, please contact night-coverage [email or web address] Password [email or web address]

## 2015-01-17 NOTE — NC FL2 (Signed)
[place] LEVEL OF CARE SCREENING TOOL     IDENTIFICATION  Patient Name: Kathleen [name] Birthdate: 1931/12/30 Sex: female Admission Date (Current Location): 01/15/2015  [facility] and [place] Number: Herbalist and Address:  [facility] [place], [place] [address] [place], [place] [address]      Provider Number: [id number]  Attending Physician Name and Address:  Debbe Odea, MD  Relative Name and Phone Number:  [place] Spouse [phone]    Current Level of Care: Hospital Recommended Level of Care: [place] Prior Approval Number:    Date Approved/Denied:   PASRR Number: [id number]  Discharge Plan: SNF    Current Diagnoses: Patient Active Problem List   Diagnosis Date Noted  . DM type 2 goal A1C below 7.5 01/16/2015  . Hip fracture, left ([place]) 01/15/2015  . Varicose veins of leg with complications [id number]  . Exocrine pancreatic insufficiency ([place]) 11/03/2013  . Physical deconditioning 01/29/2012  . Elevated LFTs 01/29/2012  . Pancreatitis 01/27/2012  . Hyperlipidemia 01/27/2012  . Hypokalemia 01/27/2012  . Hypertension   . Hypothyroidism     Orientation ACTIVITIES/SOCIAL BLADDER RESPIRATION    Self, Time, Situation, Place    Incontinent Normal  BEHAVIORAL SYMPTOMS/MOOD NEUROLOGICAL BOWEL NUTRITION STATUS      Continent Diet (Regular)  PHYSICIAN VISITS COMMUNICATION OF NEEDS Height & Weight Skin    Verbally 5\' 3"  (160 cm) 140 lbs. Surgical wounds          AMBULATORY STATUS RESPIRATION    Supervision limited Normal      Personal Care Assistance Level of Assistance  Bathing, Dressing Bathing Assistance: Limited assistance   Dressing Assistance: Limited assistance      Functional Limitations Info  Hearing   Hearing Info: Impaired         SPECIAL CARE FACTORS FREQUENCY  PT (By licensed PT), OT (By licensed OT)     PT Frequency: 3X a week OT Frequency: 2x a week            Additional Factors Info  Insulin Sliding Scale    Allergies: ALENDRONATE, CEPHALEXIN, CLINDAMYCIN/LINCOMYCIN, CREON, DOXYCYCLINE, FLORASTOR, PENICILLINS, ZENPEP     Insulin Sliding Scale Info: 4x a day       Current Medications (01/17/2015):  This is the current hospital active medication list Current Facility-Administered Medications  Medication Dose Route Frequency Provider Last Rate Last Dose  . acetaminophen (TYLENOL) tablet 650 mg  650 mg Oral Q6H PRN Renette Butters, MD   650 mg at 01/16/15 2225   Or  . acetaminophen (TYLENOL) suppository 650 mg  650 mg Rectal Q6H PRN Renette Butters, MD      . amLODipine (NORVASC) tablet 5 mg  5 mg Oral q morning - 10a Debbe Odea, MD      . aspirin EC tablet 325 mg  325 mg Oral Q breakfast Renette Butters, MD   325 mg at 01/16/15 1045  . dorzolamide-timolol (COSOPT) 22.3-6.8 MG/ML ophthalmic solution 1 drop  1 drop Both Eyes BID Debbe Odea, MD   1 drop at 01/16/15 2225  . famotidine (PEPCID) tablet 20 mg  20 mg Oral BID Debbe Odea, MD   20 mg at 01/16/15 2224  . fluticasone (FLONASE) 50 MCG/ACT nasal spray 1 spray  1 spray Each Nare Daily Saima Rizwan, MD      . hydrALAZINE (APRESOLINE) injection 5 mg  5 mg Intravenous Q4H PRN Nimish Luther Parody, MD      .  HYDROcodone-acetaminophen (NORCO/VICODIN) 5-325 MG per tablet 1-2 tablet  1-2 tablet Oral Q6H PRN Renette Butters, MD      . insulin aspart (novoLOG) injection 0-15 Units  0-15 Units Subcutaneous Q4H Doree Albee, MD   2 Units at 01/16/15 1815  . levothyroxine (SYNTHROID, LEVOTHROID) tablet 88 mcg  88 mcg Oral QAC breakfast Debbe Odea, MD      . lipase/protease/amylase (CREON) capsule 24,000 Units  24,000 Units Oral TID WC Saima Rizwan, MD      . menthol-cetylpyridinium (CEPACOL) lozenge 3 mg  1 lozenge Oral PRN Renette Butters, MD       Or  . phenol (CHLORASEPTIC) mouth spray 1 spray  1 spray Mouth/Throat PRN Renette Butters, MD      . metoCLOPramide (REGLAN) tablet 5-10 mg   5-10 mg Oral Q8H PRN Renette Butters, MD       Or  . metoCLOPramide (REGLAN) injection 5-10 mg  5-10 mg Intravenous Q8H PRN Renette Butters, MD      . montelukast (SINGULAIR) tablet 10 mg  10 mg Oral QHS Debbe Odea, MD   10 mg at 01/16/15 2224  . morphine 4 MG/ML injection 4 mg  4 mg Intravenous Q1H PRN Francine Graven, DO   4 mg at 01/15/15 1515  . ondansetron (ZOFRAN) tablet 4 mg  4 mg Oral Q6H PRN Nimish Luther Parody, MD       Or  . ondansetron (ZOFRAN) injection 4 mg  4 mg Intravenous Q6H PRN Nimish C Gosrani, MD      . polyvinyl alcohol (LIQUIFILM TEARS) 1.4 % ophthalmic solution 1 drop  1 drop Both Eyes TID PRN Debbe Odea, MD         Discharge Medications: Please see discharge summary for a list of discharge medications.  Relevant Imaging Results:  Relevant Lab Results:  Recent Labs    Additional Information SSN [id number]  Ross Ludwig, [place]

## 2015-01-17 NOTE — Progress Notes (Signed)
     Subjective:  POD#2 L hip pinning for fx. Patient reports pain as mild to moderate.  Resting comfortably in the bed this morning. Mobilizing with assistance.  Will need SNF at discharge. Awaiting bed placement.   Objective:   VITALS:   Filed Vitals:   01/16/15 0615 01/16/15 1300 01/16/15 2025 01/17/15 0524  BP: 159/59 168/66 127/59 140/64  Pulse: 86 85 76 69  Temp: 98 F (36.7 C) 98.5 F (36.9 C) 98.2 F (36.8 C) 98.1 F (36.7 C)  TempSrc: Oral Oral Oral Oral  Resp: 16 16 16 16   Height:      Weight:      SpO2: 97% 98% 98% 96%    Neurologically intact ABD soft Neurovascular intact Sensation intact distally Intact pulses distally Dorsiflexion/Plantar flexion intact Incision: dressing C/D/I   Lab Results  Component Value Date   WBC 7.6 01/15/2015   HGB 13.2 01/15/2015   HCT 39.2 01/15/2015   MCV 89.5 01/15/2015   PLT 189 01/15/2015   BMET    Component Value Date/Time   NA 137 01/15/2015 1545   K 3.4* 01/15/2015 1545   CL 101 01/15/2015 1545   CO2 31 01/15/2015 1545   GLUCOSE 103* 01/15/2015 1545   BUN 11 01/15/2015 1545   CREATININE 0.53 01/15/2015 1545   CALCIUM 9.3 01/15/2015 1545   GFRNONAA >60 01/15/2015 1545   GFRAA >60 01/15/2015 1545     Assessment/Plan: 2 Days Post-Op   Principal Problem:   Hip fracture, left (H[facility]) Active Problems:   Hypertension   Hypothyroidism   DM type 2 goal A1C below 7.5   Up with therapy WBAT in the LLE ASA 325mg  daily for DVT prophylaxis Ok from ortho standpoint to discharge once bed is available.    Chistian Kasler Lelan Pons 01/17/2015, 6:32 AM Cell [phone]

## 2015-01-18 ENCOUNTER — Encounter (HOSPITAL_COMMUNITY)
Admission: AD | Admit: 2015-01-18 | Discharge: 2015-01-18 | Disposition: A | Discharge status: Home / Self Care | Payer: Medicare Other | Source: Skilled Nursing Facility | Attending: Internal Medicine | Admitting: Internal Medicine

## 2015-01-18 LAB — CBC
HCT: 33.8 % — ABNORMAL LOW (ref 36.0–46.0)
HEMOGLOBIN: 10.9 g/dL — AB (ref 12.0–15.0)
MCH: 29.1 pg (ref 26.0–34.0)
MCHC: 32.2 g/dL (ref 30.0–36.0)
MCV: 90.1 fL (ref 78.0–100.0)
Platelets: 200 10*3/uL (ref 150–400)
RBC: 3.75 MIL/uL — AB (ref 3.87–5.11)
RDW: 13.4 % (ref 11.5–15.5)
WBC: 5.9 10*3/uL (ref 4.0–10.5)

## 2015-01-18 LAB — BASIC METABOLIC PANEL
ANION GAP: 4 — AB (ref 5–15)
BUN: 11 mg/dL (ref 6–20)
CALCIUM: 8.7 mg/dL — AB (ref 8.9–10.3)
CO2: 26 mmol/L (ref 22–32)
Chloride: 108 mmol/L (ref 101–111)
Creatinine, Ser: 0.47 mg/dL (ref 0.44–1.00)
Glucose, Bld: 116 mg/dL — ABNORMAL HIGH (ref 65–99)
Potassium: 3.3 mmol/L — ABNORMAL LOW (ref 3.5–5.1)
SODIUM: 138 mmol/L (ref 135–145)

## 2015-01-22 ENCOUNTER — Encounter: Payer: Self-pay | Admitting: Internal Medicine

## 2015-01-22 ENCOUNTER — Encounter (HOSPITAL_COMMUNITY)
Admission: RE | Admit: 2015-01-22 | Discharge: 2015-01-22 | Disposition: A | Discharge status: Home / Self Care | Payer: Medicare Other | Source: Skilled Nursing Facility | Attending: Internal Medicine | Admitting: Internal Medicine

## 2015-01-22 ENCOUNTER — Non-Acute Institutional Stay (SKILLED_NURSING_FACILITY): Payer: Medicare Other | Admitting: Internal Medicine

## 2015-01-22 DIAGNOSIS — I10 Essential (primary) hypertension: Secondary | ICD-10-CM | POA: Diagnosis not present

## 2015-01-22 DIAGNOSIS — E876 Hypokalemia: Secondary | ICD-10-CM | POA: Diagnosis not present

## 2015-01-22 DIAGNOSIS — S72142A Displaced intertrochanteric fracture of left femur, initial encounter for closed fracture: Secondary | ICD-10-CM | POA: Diagnosis not present

## 2015-01-22 DIAGNOSIS — K8681 Exocrine pancreatic insufficiency: Secondary | ICD-10-CM | POA: Diagnosis not present

## 2015-01-22 DIAGNOSIS — R197 Diarrhea, unspecified: Secondary | ICD-10-CM | POA: Diagnosis not present

## 2015-01-22 LAB — BASIC METABOLIC PANEL
Anion gap: 7 (ref 5–15)
BUN: 21 mg/dL — AB (ref 6–20)
CALCIUM: 8.9 mg/dL (ref 8.9–10.3)
CO2: 27 mmol/L (ref 22–32)
CREATININE: 0.63 mg/dL (ref 0.44–1.00)
Chloride: 103 mmol/L (ref 101–111)
GFR calc Af Amer: >60 mL/min (ref >60)
Glucose, Bld: 104 mg/dL — ABNORMAL HIGH (ref 65–99)
Potassium: 3.3 mmol/L — ABNORMAL LOW (ref 3.5–5.1)
SODIUM: 137 mmol/L (ref 135–145)

## 2015-01-22 LAB — CBC
HCT: 34.1 % — ABNORMAL LOW (ref 36.0–46.0)
Hemoglobin: 11.2 g/dL — ABNORMAL LOW (ref 12.0–15.0)
MCH: 29.3 pg (ref 26.0–34.0)
MCHC: 32.8 g/dL (ref 30.0–36.0)
MCV: 89.3 fL (ref 78.0–100.0)
PLATELETS: 251 10*3/uL (ref 150–400)
RBC: 3.82 MIL/uL — ABNORMAL LOW (ref 3.87–5.11)
RDW: 13.3 % (ref 11.5–15.5)
WBC: 6.1 10*3/uL (ref 4.0–10.5)

## 2015-01-24 NOTE — Progress Notes (Addendum)
Patient ID: Kathleen [name], female   DOB: [birth year], 81 y.o.   MRN: [id number]                HISTORY & PHYSICAL  DATE:  01/22/2015       FACILITY: [facility]  LEVEL OF CARE:   SNF   CHIEF COMPLAINT:  Admission to SNF, post stay at [facility], 01/15/2015 through 01/17/2015.    HISTORY OF PRESENT ILLNESS:  This is a patient who lives independently in her own home somewhere outside of [place].  She lives with her husband.  She has a cane and a walker at home but does not use them in the house, uses the cane outside the home.    In any case, she was bending over to pick up a pillow and went to throw it on the couch, lost her balance and fell.  She suffered a left hip fracture.  She underwent a left hip ORIF.  As far as I can tell, she did well postoperatively.    The patient really has no complaints other than she thinks she is getting too much of her medication for diarrhea, which is Imodium.  She thinks it was Glucophage that caused her diarrhea in the first place.    PAST MEDICAL HISTORY/PROBLEM LIST:           Hypertension.    Hypercholesterolemia.     Type 2 diabetes.    Hypothyroidism.    Diverticulosis.    Arthritis.    History of pancreatitis.     CURRENT MEDICATIONS:  Discharge medications include:            Amlodipine 5 q.d.      ASA 325 q.d.      Caltrate 600 plus D, 1 tablet b.i.d.      Refresh ophthalmic.     Cosopt ophthalmic.    Vitamin B12, 1000 every 30 days.     Colace 1 b.i.d.      Fish oil, 500, 2 capsules daily.    Flonase 1 spray into both nostrils daily.    Hydrochlorothiazide 25 daily.    Norco 1-2 q.6 hours p.r.n.      Xyzal 5 mg q.d.      Synthroid 88 q.d.      Prinivil 2.5 q.d.      Imodium 2 mg as needed for diarrhea.     Metformin XR 500 daily.      Singulair 10 mg daily.    Pancrelipase 1 capsule/20,000 U three times daily before meals.    Klor-Con 30 daily.     Zantac 150 b.i.d.      Tafluprost  1 drop OU at bedtime.    SOCIAL HISTORY:                   HOUSING:  The patient tells me that she lives outside of [place] in her own home.   FUNCTIONAL STATUS:  She occasionally will use a cane for outside activities.  She describes herself as independent with ADLs and IADLs.  Does not have a significant fall history.    FAMILY HISTORY:   Reviewed.     REVIEW OF SYSTEMS:       HEENT:  No headache.      CHEST/RESPIRATORY:  No shortness of breath.      CARDIAC:  No chest pain.    GI:  No nausea or vomiting.  She does describe diarrhea for which she has seen Gastroenterology.  She is complaining about her current medications although I cannot really determine, looking at her long list of medications, what it is that she is concerned about.  She states we are giving her too much of her diarrhea medication and now she is constipated.   GU:  No complaints of dysuria.    MUSCULOSKELETAL:  Extremities:  States her pain is tolerable.  She does not have a history of falls.      PHYSICAL EXAMINATION:   GENERAL APPEARANCE:  Healthy, alert woman in no distress.    HEENT:   MOUTH/THROAT:  Oral exam is normal.   CHEST/RESPIRATORY:  Clear air entry bilaterally.     CARDIOVASCULAR:   CARDIAC:  Heart sounds are normal.  She does not appear to be dehydrated.     GASTROINTESTINAL:   ABDOMEN:  Slightly distended.   LIVER/SPLEEN/KIDNEY:   No liver, no spleen.   HERNIA:  She has a fairly substantial periumbilical hernia that reduces easily.  This is nontender.   GENITOURINARY:   BLADDER:  No suprapubic or costovertebral angle tenderness.   CIRCULATION:   EDEMA/VARICOSITIES:  Extremities:  No edema.   NEUROLOGICAL:  No lateralizing findings.   PSYCHIATRIC:   MENTAL STATUS:    She is bright, alert, conversational.  Able to give her own history.    ASSESSMENT/PLAN:            Fall with left hip fracture.   Status post ORIF.  She is not on anything for DVT prophylaxis other than aspirin 325 daily.       ?Osteoporosis.  She denies this.  Says she has had a DEXA scan.    Diarrhea.   I really cannot follow this.  The patient states she is now on a medication three times a day that she had been only taking one a day.  I wonder whether this is her pancreatic enzyme replacement she is talking about.  If so, this is usually given a.c. meals.    History of pancreatitis.  I think this was in 2014.  She had an endoscopic ultrasound.  The comment was that she had passed a common bile duct stone as the cause of the patient's prior acute pancreatitis.  She had findings suggestive of chronic pancreatitis.  There was no pancreatic mass. I am assuming exocrome [ancreas insuff.   Hypokalemia.  She is on potassium 30 mEq daily.  Her potassium today is 3.3.  It was 3.3 on 01/18/2015 and 3.4 on 01/15/2015.  I will change her potassium to 20 b.i.d.      Hypothyroidism.  On replacement.  I do not see a recent TSH.  However, she may not get her primary care locally.    Type 2 diabetes.  On Glucophage XR 500 a day.  She, again, is having difficulties about whether we give this to her once a day or twice a day.  I am not sure whether she was taking a short-acting or a long-acting drug.  She states this caused her diarrhea.    Hypertension.  On hydrochlorothiazide and Lisinopril.  This is probably the cause of her hypokalemia.

## 2015-01-26 ENCOUNTER — Non-Acute Institutional Stay (SKILLED_NURSING_FACILITY): Payer: Medicare Other | Admitting: Internal Medicine

## 2015-01-26 ENCOUNTER — Encounter: Payer: Self-pay | Admitting: Internal Medicine

## 2015-01-26 ENCOUNTER — Encounter (HOSPITAL_COMMUNITY)
Admission: AD | Admit: 2015-01-26 | Discharge: 2015-01-26 | Disposition: A | Discharge status: Home / Self Care | Payer: Medicare Other | Source: Skilled Nursing Facility | Attending: Internal Medicine | Admitting: Internal Medicine

## 2015-01-26 DIAGNOSIS — E876 Hypokalemia: Secondary | ICD-10-CM

## 2015-01-26 DIAGNOSIS — I10 Essential (primary) hypertension: Secondary | ICD-10-CM | POA: Diagnosis not present

## 2015-01-26 DIAGNOSIS — T148 Other injury of unspecified body region: Secondary | ICD-10-CM

## 2015-01-26 DIAGNOSIS — S72002D Fracture of unspecified part of neck of left femur, subsequent encounter for closed fracture with routine healing: Secondary | ICD-10-CM

## 2015-01-26 DIAGNOSIS — T148XXA Other injury of unspecified body region, initial encounter: Secondary | ICD-10-CM

## 2015-01-26 LAB — BASIC METABOLIC PANEL
ANION GAP: 7 (ref 5–15)
BUN: 19 mg/dL (ref 6–20)
CALCIUM: 8.7 mg/dL — AB (ref 8.9–10.3)
CHLORIDE: 102 mmol/L (ref 101–111)
CO2: 27 mmol/L (ref 22–32)
Creatinine, Ser: 0.63 mg/dL (ref 0.44–1.00)
GFR calc Af Amer: >60 mL/min (ref >60)
GFR calc non Af Amer: >60 mL/min (ref >60)
GLUCOSE: 111 mg/dL — AB (ref 65–99)
Potassium: 3.6 mmol/L (ref 3.5–5.1)
Sodium: 136 mmol/L (ref 135–145)

## 2015-01-26 LAB — CBC
HEMATOCRIT: 33.2 % — AB (ref 36.0–46.0)
HEMOGLOBIN: 11 g/dL — AB (ref 12.0–15.0)
MCH: 29.7 pg (ref 26.0–34.0)
MCHC: 33.1 g/dL (ref 30.0–36.0)
MCV: 89.7 fL (ref 78.0–100.0)
Platelets: 256 10*3/uL (ref 150–400)
RBC: 3.7 MIL/uL — ABNORMAL LOW (ref 3.87–5.11)
RDW: 13.4 % (ref 11.5–15.5)
WBC: 5.7 10*3/uL (ref 4.0–10.5)

## 2015-01-26 NOTE — Progress Notes (Signed)
Patient ID: Kathleen [name], female   DOB: 05/30/1931, 81 y.o.   MRN: [id number]              DATE:  01/26/2015       FACILITY: [facility]  LEVEL OF CARE:   SNF   CHIEF COMPLAINT:  Acute visit follow-up left hip hematoma--hypokalemia-  HISTORY OF PRESENT ILLNESS:   Patient is an elderly resident who fell at home and sustained a left hip fracture and subsequently received an ORIF apparently without any major complications.  She did develop a hematoma on her lateral left hip area status post surgery-I am following up on this.  Patient is not really complaining of any pain here-blood work done today shows her hemoglobin and platelets are stable at 11 and 256,000 respectively.  She is currently wheeling about the facility does not complain of any shortness of breath chest pain or increased weakness or fatigue.   She is on aspirin 325 mg a day for anticoagulation   Potassium also has been somewhat low in the 3.3-3.4 range Dr. Dellia Nims did increase her potassium to 20 mEq 3 times a day earlier this week-updated lab today shows normalization at 3.6  PAST MEDICAL HISTORY/PROBLEM LIST:           Hypertension.    Hypercholesterolemia.     Type 2 diabetes.    Hypothyroidism.    Diverticulosis.    Arthritis.    History of pancreatitis.     CURRENT MEDICATIONS:  Discharge medications include:            Amlodipine 5 q.d.      ASA 325 q.d.      Caltrate 600 plus D, 1 tablet b.i.d.      Refresh ophthalmic.     Cosopt ophthalmic.    Vitamin B12, 1000 every 30 days.     Colace 1 b.i.d.      Fish oil, 500, 2 capsules daily.    Flonase 1 spray into both nostrils daily.    Hydrochlorothiazide 25 daily.    Norco 1-2 q.6 hours p.r.n.      Xyzal 5 mg q.d.      Synthroid 88 q.d.      Prinivil 2.5 q.d.      Imodium 2 mg as needed for diarrhea.     Metformin XR 500 daily.      Singulair 10 mg daily.    Pancrelipase 1 capsule/20,000 U three times daily  before meals.    Klor-Con 30 daily.     Zantac 150 b.i.d.      Tafluprost 1 drop OU at bedtime.    SOCIAL HISTORY:                   HOUSING:  The patient tells lives outside of [place] in her own home.   FUNCTIONAL STATUS:  She occasionally will use a cane for outside activities.  She describes herself as independent with ADLs and IADLs.  Does not have a significant fall history.    FAMILY HISTORY:   Reviewed.     REVIEW OF SYSTEMS General does not complain of increased fatigue or pain.  Skin has developed a hematoma noted by nursing on the left hip surgical site does not complain of pain with this:       HEENT:  No headache.      CHEST/RESPIRATORY:  No shortness of breath.      CARDIAC:  No chest pain.  MUSCULOSKELETAL:  Extremities:  States her pain is tolerable.  She does not have a history of falls Neurologic does not complain of headaches dizziness or syncopal-type feelings.      PHYSICAL EXAMINATION:  Temperature 97.8 pulse 89 respirations 20 blood pressure 143/82-128/64 most recently  GENERAL APPEARANCE:  Healthy, alert woman in no distress Skin is warm and dry-surgical site left hip Steri-Strips are in place I do not see any drainage or bleeding there is a violaceous bruise that extends from the lateral left thigh to above the knee-this is not  tender to palpation I do not see signs of cellulitis at this point there is no drainage bleeding or concerning warmth Head ears eyes nose mouth and throat within normal limits oropharynx is clear mucous membranes moist visual acuity appears grossly intact.      CHEST/RESPIRATORY:  Clear air entry bilaterally.--No labored breathing     CARDIOVASCULAR:   CARDIAC:  Heart sounds are normal.  She does not appear to be dehydrated.  Musculoskeletal -surgical site on left hip as noted above-currently ambulating and wheelchair appears to be doing well here       EDEMA/VARICOSITIES:  Extremities:  No edema.  Moves all extremities  4 currently ambulating and wheelchair  NEUROLOGICAL:  No lateralizing findings. Her speech is clear   PSYCHIATRIC:   MENTAL STATUS:    She is bright, alert, conversational.  Able to give her own history--currently ambulating and wheelchair  Labs.  01/26/2015.  WBC 5.7 hemoglobin 11.0 platelets 256.  Sodium 136 potassium 3.6 BUN 19 creatinine 0.63.  .    ASSESSMENT/PLAN:            Fall with left hip fracture.   Status post ORIF.  She is not on anything for DVT prophylaxis other than aspirin 325 daily. --  She has developed a hematoma I suspect this is status post surgery-clinically appears to be stable her hemoglobin is stable she does not report any increased pain-this will need close monitoring by wound care for any changes-hopefully this will slowly resolve--she appears to be tolerating this well currently.  We'll update CBC on Monday, December 19    History of hypokalemia this is being supplemented Dr. Dellia Nims did increase her to 20 mEq 3 times a day and her potassium is fairly quickly normalized at 3.6 on lab done today-we'll reduce potassium to 20 mEq twice a day and recheck a BMP on Monday, December 19  Her potassium had run from 3.3-3.4 recently it was 3.3 most recently on December 12 before ait  wasmore aggressively supplemented                 Hypertension.  On hydrochlorothiazide and Lisinopril.  This is probably the cause of her hypokalemia--this has normalized on supplementation.    I have reviewed her blood pressures these appear reasonably stable 128/64-138/83-143/82 at this point continue to monitor I do not see consistent elevations above 140  [id number]  .

## 2015-01-29 ENCOUNTER — Encounter (HOSPITAL_COMMUNITY)
Admission: RE | Admit: 2015-01-29 | Discharge: 2015-01-29 | Disposition: A | Discharge status: Home / Self Care | Payer: Medicare Other | Source: Skilled Nursing Facility | Attending: Internal Medicine | Admitting: Internal Medicine

## 2015-01-29 LAB — CBC
HEMATOCRIT: 33.2 % — AB (ref 36.0–46.0)
HEMOGLOBIN: 11.2 g/dL — AB (ref 12.0–15.0)
MCH: 30.4 pg (ref 26.0–34.0)
MCHC: 33.7 g/dL (ref 30.0–36.0)
MCV: 90.2 fL (ref 78.0–100.0)
Platelets: 249 10*3/uL (ref 150–400)
RBC: 3.68 MIL/uL — AB (ref 3.87–5.11)
RDW: 13.7 % (ref 11.5–15.5)
WBC: 6.3 10*3/uL (ref 4.0–10.5)

## 2015-01-29 LAB — BASIC METABOLIC PANEL
Anion gap: 5 (ref 5–15)
BUN: 19 mg/dL (ref 6–20)
CHLORIDE: 104 mmol/L (ref 101–111)
CO2: 29 mmol/L (ref 22–32)
Calcium: 9 mg/dL (ref 8.9–10.3)
Creatinine, Ser: 0.67 mg/dL (ref 0.44–1.00)
GFR calc non Af Amer: >60 mL/min (ref >60)
Glucose, Bld: 98 mg/dL (ref 65–99)
POTASSIUM: 3.8 mmol/L (ref 3.5–5.1)
SODIUM: 138 mmol/L (ref 135–145)

## 2015-02-02 ENCOUNTER — Non-Acute Institutional Stay (SKILLED_NURSING_FACILITY): Payer: Medicare Other | Admitting: Internal Medicine

## 2015-02-02 ENCOUNTER — Encounter: Payer: Self-pay | Admitting: Internal Medicine

## 2015-02-02 DIAGNOSIS — E876 Hypokalemia: Secondary | ICD-10-CM

## 2015-02-02 DIAGNOSIS — S72002D Fracture of unspecified part of neck of left femur, subsequent encounter for closed fracture with routine healing: Secondary | ICD-10-CM | POA: Diagnosis not present

## 2015-02-02 DIAGNOSIS — K8681 Exocrine pancreatic insufficiency: Secondary | ICD-10-CM

## 2015-02-02 DIAGNOSIS — I10 Essential (primary) hypertension: Secondary | ICD-10-CM | POA: Diagnosis not present

## 2015-02-02 NOTE — Progress Notes (Signed)
Patient ID: Kathleen [name], female   DOB: 1931-03-21, 81 y.o.   MRN: [id number]                DATE:  02/02/2015       FACILITY: [facility]  LEVEL OF CARE:   SNF  This is a discharge note   CHIEF COMPLAINT:  Discharge note.    HISTORY OF PRESENT ILLNESS:  This is a patient who lives independently in her own home somewhere outside of [place].  She lives with her husband.  She has a cane and a walker at home but does not use them in the house, uses the cane outside the home.    In any case, she was bending over to pick up a pillow and went to throw it on the couch, lost her balance and fell.  She suffered a left hip fracture.  She underwent a left hip ORIF.  , she did well postoperatively.     Her stay here is been relatively unremarkable she has progressed with therapy and she will be going home she will need PT and OT for strengthening.  She did have hypokalemia noted on a recent lab and this was supplemented and has since normalized at 3.8 she will be discharged on a lower dose of potassium this will need updating as an outpatient.  She also has a history of pancreatic insufficiency on supplementation.  She did have a small hematoma on her left hip that persists however she saw orthopedics yesterday and thought to be doing well and stable I suspect  will take some time for this hematoma to slowly dissolve    PAST MEDICAL HISTORY/PROBLEM LIST:           Hypertension.    Hypercholesterolemia.     Type 2 diabetes.    Hypothyroidism.    Diverticulosis.    Arthritis.    History of pancreatitis.     CURRENT MEDICATIONS:  Discharge medications include:            Amlodipine 5 q.d.      ASA 325 q.d.      Caltrate 600 plus D, 1 tablet b.i.d.      Refresh ophthalmic.     Cosopt ophthalmic.    Vitamin B12, 1000 every 30 days.     Colace 1 b.i.d.      Fish oil, 500, 2 capsules daily.    Flonase 1 spray into both nostrils daily.     Hydrochlorothiazide 25 daily.    Norco 1-2 q.6 hours p.r.n.      Xyzal 5 mg q.d.      Synthroid 88 q.d.      Prinivil 2.5 q.d.      Imodium 2 mg as needed for diarrhea.     Metformin XR 500 daily  Potassium 20 mEq twice a day.      Singulair 10 mg daily.    Pancrelipase 1 capsule/20,000 U three times daily before meals.    .     Zantac 150 b.i.d.      Tafluprost 1 drop OU at bedtime.    SOCIAL HISTORY:                   HOUSING:  The patient tells me that she lives outside of [place] in her own home.   FUNCTIONAL STATUS:  She occasionally will use a cane for outside activities.  She describes herself as independent with ADLs and IADLs.  Does not have  a significant fall history.    FAMILY HISTORY:   Reviewed.     REVIEW OF SYSTEMS Gen. no complaints of fever or chills.  Skin does have the hematoma on her left hip that I suspect will slowly resolve she is not really having pain with this:       HEENT:  No headache.      CHEST/RESPIRATORY:  No shortness of breath.      CARDIAC:  No chest pain.    GI:  No nausea or vomiting. She probably does have some history of chronic diarrhea this has seen GI in the past she is not really complaining of diarrhea today.      GU:  No complaints of dysuria.    MUSCULOSKELETAL:  Extremities:  States her pain is tolerable.  She does not have a history of falls. Neurologic does not complain a headache dizziness or numbness      PHYSICAL EXAMINATION:    Temperature 98.1 pulse 74 respirations 20 blood pressure 152/79-136/82-121/47 most recently.   GENERAL APPEARANCE:  Healthy, alert woman in no distress . Her skin is warm and dry she does have a small violaceous raised area left hip. This is not painful or warm appears to be a hematoma   HEENT: Has prescription lenses sclera and duct far clear visual acuity appears grossly intact   MOUTH/THROAT:  Oral exam is normal.   CHEST/RESPIRATORY:  Clear air entry bilaterally with somewhat  shallow air entry.     CARDIOVASCULAR:   CARDIAC:  Heart sounds are normal.  She does not appear to be dehydrated.     GASTROINTESTINAL:   ABDOMEN:  Slightly distended.  does have an umbilical hernia bowel sounds are positive abdomen is soft nontender   .   CIRCULATION:   EDEMA/VARICOSITIES:  Extremities:  No edema. She is able to stand and use her walker appears to be doing fairly well but would benefit from continued PT and OT   NEUROLOGICAL:  No lateralizing findings.   PSYCHIATRIC:   MENTAL STATUS:    She is bright, alert, conversational.  Able to give her own history.  Labs.  01/29/2015.  Sodium 138 potassium 3.8 BUN 19 creatinine 0.67.  WBC 6.3-hemoglobin 11.2-platelets 249    ASSESSMENT/PLAN:            Fall with left hip fracture.   Status post ORIF.  For prophylaxis DVT she is on aspirin 325 daily. This appears stable she will need continued PT and OT-she continues on Vicodin as needed for pain but apparently does not use this much at all--    ?Osteoporosis.  She denies this.  Says she has had a DEXA scan --follow  up with primary care provider as needed.          History of pancreatitis.  2014??.  She had an endoscopic ultrasound.  The comment was that she had passed a common bile duct stone as the cause of the patient's prior acute pancreatitis.  She had findings suggestive of chronic pancreatitis.  There was no pancreatic mass.  assuming exocrome [ancreas insuff.   Hypokalemia.    She is on potassium 20 mEq twice a day potassium had been somewhat low at 3.3-3.4-it is now normalized at 3.8 will update BMP before discharge.      Hypothyroidism.  On replacement.  I do not see a recent TSH. Since she is about to be discharged will defer to primary care provider for follow-up    Type 2 diabetes.  On Glucophage XR 500 a day.  This has not been an issue during her stay here CBG stable per discussion with nursing.    Hypertension.  On hydrochlorothiazide and  Lisinopril.  This is probably the cause of her hypokalemia.--Blood pressure appears to be stable as noted above-- of note she is also on Norvasc  Again patient will be going home with her husband will need PT and OT for continued strengthening she does have DME at home  [phone] note greater than 30 minutes spent on this discharge summary-greater than 50% of time spent coordinating plan of care for numerous diagnoses

## 2015-02-03 ENCOUNTER — Other Ambulatory Visit (HOSPITAL_COMMUNITY)
Admission: AD | Admit: 2015-02-03 | Discharge: 2015-02-03 | Disposition: A | Discharge status: Home / Self Care | Payer: Medicare Other | Source: Skilled Nursing Facility | Attending: Internal Medicine | Admitting: Internal Medicine

## 2015-02-03 DIAGNOSIS — E785 Hyperlipidemia, unspecified: Secondary | ICD-10-CM | POA: Insufficient documentation

## 2015-02-03 LAB — CBC WITH DIFFERENTIAL/PLATELET
Basophils Absolute: 0 10*3/uL (ref 0.0–0.1)
Basophils Relative: 1 %
EOS ABS: 0.3 10*3/uL (ref 0.0–0.7)
Eosinophils Relative: 6 %
HEMATOCRIT: 33.2 % — AB (ref 36.0–46.0)
HEMOGLOBIN: 11.1 g/dL — AB (ref 12.0–15.0)
LYMPHS ABS: 1.4 10*3/uL (ref 0.7–4.0)
Lymphocytes Relative: 28 %
MCH: 30.1 pg (ref 26.0–34.0)
MCHC: 33.4 g/dL (ref 30.0–36.0)
MCV: 90 fL (ref 78.0–100.0)
MONOS PCT: 14 %
Monocytes Absolute: 0.7 10*3/uL (ref 0.1–1.0)
NEUTROS ABS: 2.5 10*3/uL (ref 1.7–7.7)
NEUTROS PCT: 51 %
Platelets: 224 10*3/uL (ref 150–400)
RBC: 3.69 MIL/uL — ABNORMAL LOW (ref 3.87–5.11)
RDW: 13.7 % (ref 11.5–15.5)
WBC: 4.9 10*3/uL (ref 4.0–10.5)

## 2015-02-03 LAB — BASIC METABOLIC PANEL
Anion gap: 6 (ref 5–15)
BUN: 17 mg/dL (ref 6–20)
CHLORIDE: 104 mmol/L (ref 101–111)
CO2: 26 mmol/L (ref 22–32)
CREATININE: 0.61 mg/dL (ref 0.44–1.00)
Calcium: 9 mg/dL (ref 8.9–10.3)
GFR calc non Af Amer: >60 mL/min (ref >60)
Glucose, Bld: 92 mg/dL (ref 65–99)
Potassium: 3.5 mmol/L (ref 3.5–5.1)
Sodium: 136 mmol/L (ref 135–145)

## 2015-02-05 DIAGNOSIS — I10 Essential (primary) hypertension: Secondary | ICD-10-CM

## 2015-02-05 DIAGNOSIS — E119 Type 2 diabetes mellitus without complications: Secondary | ICD-10-CM | POA: Diagnosis not present

## 2015-02-05 DIAGNOSIS — M6281 Muscle weakness (generalized): Secondary | ICD-10-CM

## 2015-02-05 DIAGNOSIS — S72002D Fracture of unspecified part of neck of left femur, subsequent encounter for closed fracture with routine healing: Secondary | ICD-10-CM | POA: Diagnosis not present

## 2015-03-01 ENCOUNTER — Ambulatory Visit (INDEPENDENT_AMBULATORY_CARE_PROVIDER_SITE_OTHER): Payer: Medicare Other | Admitting: Otolaryngology

## 2015-03-01 DIAGNOSIS — D3702 Neoplasm of uncertain behavior of tongue: Secondary | ICD-10-CM | POA: Diagnosis not present

## 2015-05-28 ENCOUNTER — Encounter: Payer: Self-pay | Admitting: Gastroenterology

## 2015-10-01 ENCOUNTER — Telehealth: Payer: Self-pay | Admitting: Gastroenterology

## 2015-10-01 NOTE — Telephone Encounter (Signed)
PATIENT CALLED AND STATED THAT THE MEDICATION SHE WAS GIVEN BY [facility] FOR DIARRHEA HAS CAUSED CONSTIPATION.  SHE WANTS TO KNOW IF SHE SHOULD STILL TAKE IT  PLEASE ADVISE [phone]

## 2015-10-01 NOTE — Telephone Encounter (Signed)
PLEASE CALL PT. SHE CAN TAKE ZENPEP ON TUES, THUR, AND SAT. DRINK WATER TO KEEP YOUR URINE LIGHT YELLOW. FOLLOW A HIGH FIBER DIET.

## 2015-10-01 NOTE — Telephone Encounter (Signed)
PT is aware.

## 2015-10-01 NOTE — Telephone Encounter (Signed)
Pt has been taking the Zenpep once a day. She is more constipated now, so she has not taken one since Friday.  She has a little BM every day , but very little.  Please advise!

## 2015-12-03 ENCOUNTER — Other Ambulatory Visit: Payer: Self-pay | Admitting: Family Medicine

## 2015-12-03 DIAGNOSIS — Z1231 Encounter for screening mammogram for malignant neoplasm of breast: Secondary | ICD-10-CM

## 2015-12-28 ENCOUNTER — Ambulatory Visit
Admission: RE | Admit: 2015-12-28 | Discharge: 2015-12-28 | Disposition: A | Discharge status: Home / Self Care | Payer: Medicare Other | Source: Ambulatory Visit | Attending: Family Medicine | Admitting: Family Medicine

## 2015-12-28 DIAGNOSIS — Z1231 Encounter for screening mammogram for malignant neoplasm of breast: Secondary | ICD-10-CM

## 2016-02-14 DIAGNOSIS — J309 Allergic rhinitis, unspecified: Secondary | ICD-10-CM | POA: Diagnosis not present

## 2016-02-20 DIAGNOSIS — Z9109 Other allergy status, other than to drugs and biological substances: Secondary | ICD-10-CM | POA: Diagnosis not present

## 2016-02-21 DIAGNOSIS — H524 Presbyopia: Secondary | ICD-10-CM | POA: Diagnosis not present

## 2016-02-21 DIAGNOSIS — H401132 Primary open-angle glaucoma, bilateral, moderate stage: Secondary | ICD-10-CM | POA: Diagnosis not present

## 2016-03-03 DIAGNOSIS — Z9109 Other allergy status, other than to drugs and biological substances: Secondary | ICD-10-CM | POA: Diagnosis not present

## 2016-03-04 DIAGNOSIS — L84 Corns and callosities: Secondary | ICD-10-CM | POA: Diagnosis not present

## 2016-03-04 DIAGNOSIS — E1151 Type 2 diabetes mellitus with diabetic peripheral angiopathy without gangrene: Secondary | ICD-10-CM | POA: Diagnosis not present

## 2016-03-04 DIAGNOSIS — B351 Tinea unguium: Secondary | ICD-10-CM | POA: Diagnosis not present

## 2016-03-12 DIAGNOSIS — J309 Allergic rhinitis, unspecified: Secondary | ICD-10-CM | POA: Diagnosis not present

## 2016-03-12 DIAGNOSIS — E538 Deficiency of other specified B group vitamins: Secondary | ICD-10-CM | POA: Diagnosis not present

## 2016-03-19 DIAGNOSIS — H401132 Primary open-angle glaucoma, bilateral, moderate stage: Secondary | ICD-10-CM | POA: Diagnosis not present

## 2016-03-20 DIAGNOSIS — J309 Allergic rhinitis, unspecified: Secondary | ICD-10-CM | POA: Diagnosis not present

## 2016-03-27 DIAGNOSIS — Z9109 Other allergy status, other than to drugs and biological substances: Secondary | ICD-10-CM | POA: Diagnosis not present

## 2016-04-03 DIAGNOSIS — L97221 Non-pressure chronic ulcer of left calf limited to breakdown of skin: Secondary | ICD-10-CM | POA: Diagnosis not present

## 2016-04-03 DIAGNOSIS — E538 Deficiency of other specified B group vitamins: Secondary | ICD-10-CM | POA: Diagnosis not present

## 2016-04-03 DIAGNOSIS — R600 Localized edema: Secondary | ICD-10-CM | POA: Diagnosis not present

## 2016-04-03 DIAGNOSIS — I83022 Varicose veins of left lower extremity with ulcer of calf: Secondary | ICD-10-CM | POA: Diagnosis not present

## 2016-04-03 DIAGNOSIS — Z9109 Other allergy status, other than to drugs and biological substances: Secondary | ICD-10-CM | POA: Diagnosis not present

## 2016-04-07 DIAGNOSIS — L97221 Non-pressure chronic ulcer of left calf limited to breakdown of skin: Secondary | ICD-10-CM | POA: Diagnosis not present

## 2016-04-07 DIAGNOSIS — I83022 Varicose veins of left lower extremity with ulcer of calf: Secondary | ICD-10-CM | POA: Diagnosis not present

## 2016-04-07 DIAGNOSIS — R600 Localized edema: Secondary | ICD-10-CM | POA: Diagnosis not present

## 2016-04-10 DIAGNOSIS — Z9109 Other allergy status, other than to drugs and biological substances: Secondary | ICD-10-CM | POA: Diagnosis not present

## 2016-04-14 DIAGNOSIS — I83022 Varicose veins of left lower extremity with ulcer of calf: Secondary | ICD-10-CM | POA: Diagnosis not present

## 2016-04-14 DIAGNOSIS — L97221 Non-pressure chronic ulcer of left calf limited to breakdown of skin: Secondary | ICD-10-CM | POA: Diagnosis not present

## 2016-04-14 DIAGNOSIS — R600 Localized edema: Secondary | ICD-10-CM | POA: Diagnosis not present

## 2016-04-16 DIAGNOSIS — J301 Allergic rhinitis due to pollen: Secondary | ICD-10-CM | POA: Diagnosis not present

## 2016-04-16 DIAGNOSIS — T783XXD Angioneurotic edema, subsequent encounter: Secondary | ICD-10-CM | POA: Diagnosis not present

## 2016-04-16 DIAGNOSIS — L509 Urticaria, unspecified: Secondary | ICD-10-CM | POA: Diagnosis not present

## 2016-04-16 DIAGNOSIS — J3089 Other allergic rhinitis: Secondary | ICD-10-CM | POA: Diagnosis not present

## 2016-04-17 DIAGNOSIS — Z9109 Other allergy status, other than to drugs and biological substances: Secondary | ICD-10-CM | POA: Diagnosis not present

## 2016-04-23 DIAGNOSIS — H401132 Primary open-angle glaucoma, bilateral, moderate stage: Secondary | ICD-10-CM | POA: Diagnosis not present

## 2016-04-23 DIAGNOSIS — H524 Presbyopia: Secondary | ICD-10-CM | POA: Diagnosis not present

## 2016-04-24 DIAGNOSIS — J309 Allergic rhinitis, unspecified: Secondary | ICD-10-CM | POA: Diagnosis not present

## 2016-05-01 DIAGNOSIS — J309 Allergic rhinitis, unspecified: Secondary | ICD-10-CM | POA: Diagnosis not present

## 2016-05-08 DIAGNOSIS — E538 Deficiency of other specified B group vitamins: Secondary | ICD-10-CM | POA: Diagnosis not present

## 2016-05-08 DIAGNOSIS — L97221 Non-pressure chronic ulcer of left calf limited to breakdown of skin: Secondary | ICD-10-CM | POA: Diagnosis not present

## 2016-05-08 DIAGNOSIS — J309 Allergic rhinitis, unspecified: Secondary | ICD-10-CM | POA: Diagnosis not present

## 2016-05-08 DIAGNOSIS — L03116 Cellulitis of left lower limb: Secondary | ICD-10-CM | POA: Diagnosis not present

## 2016-05-08 DIAGNOSIS — I83022 Varicose veins of left lower extremity with ulcer of calf: Secondary | ICD-10-CM | POA: Diagnosis not present

## 2016-05-13 DIAGNOSIS — B351 Tinea unguium: Secondary | ICD-10-CM | POA: Diagnosis not present

## 2016-05-13 DIAGNOSIS — L84 Corns and callosities: Secondary | ICD-10-CM | POA: Diagnosis not present

## 2016-05-13 DIAGNOSIS — E1151 Type 2 diabetes mellitus with diabetic peripheral angiopathy without gangrene: Secondary | ICD-10-CM | POA: Diagnosis not present

## 2016-05-15 ENCOUNTER — Telehealth: Payer: Self-pay | Admitting: Gastroenterology

## 2016-05-15 DIAGNOSIS — L97221 Non-pressure chronic ulcer of left calf limited to breakdown of skin: Secondary | ICD-10-CM | POA: Diagnosis not present

## 2016-05-15 DIAGNOSIS — I83022 Varicose veins of left lower extremity with ulcer of calf: Secondary | ICD-10-CM | POA: Diagnosis not present

## 2016-05-16 NOTE — Telephone Encounter (Signed)
Patient needs OV with [facility]. Hasn't been seen since Nov 2016

## 2016-05-19 ENCOUNTER — Encounter: Payer: Self-pay | Admitting: Gastroenterology

## 2016-05-19 NOTE — Telephone Encounter (Signed)
APPT MADE AND LETTER SENT  °

## 2016-05-22 DIAGNOSIS — T7840XA Allergy, unspecified, initial encounter: Secondary | ICD-10-CM | POA: Insufficient documentation

## 2016-05-22 DIAGNOSIS — I83022 Varicose veins of left lower extremity with ulcer of calf: Secondary | ICD-10-CM | POA: Diagnosis not present

## 2016-05-22 DIAGNOSIS — L97221 Non-pressure chronic ulcer of left calf limited to breakdown of skin: Secondary | ICD-10-CM | POA: Diagnosis not present

## 2016-05-22 DIAGNOSIS — Z9109 Other allergy status, other than to drugs and biological substances: Secondary | ICD-10-CM | POA: Diagnosis not present

## 2016-05-29 DIAGNOSIS — I83022 Varicose veins of left lower extremity with ulcer of calf: Secondary | ICD-10-CM | POA: Diagnosis not present

## 2016-05-29 DIAGNOSIS — Z9109 Other allergy status, other than to drugs and biological substances: Secondary | ICD-10-CM | POA: Diagnosis not present

## 2016-05-29 DIAGNOSIS — L97221 Non-pressure chronic ulcer of left calf limited to breakdown of skin: Secondary | ICD-10-CM | POA: Diagnosis not present

## 2016-06-05 DIAGNOSIS — L97221 Non-pressure chronic ulcer of left calf limited to breakdown of skin: Secondary | ICD-10-CM | POA: Diagnosis not present

## 2016-06-05 DIAGNOSIS — E538 Deficiency of other specified B group vitamins: Secondary | ICD-10-CM | POA: Diagnosis not present

## 2016-06-05 DIAGNOSIS — J309 Allergic rhinitis, unspecified: Secondary | ICD-10-CM | POA: Diagnosis not present

## 2016-06-05 DIAGNOSIS — I83022 Varicose veins of left lower extremity with ulcer of calf: Secondary | ICD-10-CM | POA: Diagnosis not present

## 2016-06-12 DIAGNOSIS — L97221 Non-pressure chronic ulcer of left calf limited to breakdown of skin: Secondary | ICD-10-CM | POA: Diagnosis not present

## 2016-06-12 DIAGNOSIS — J309 Allergic rhinitis, unspecified: Secondary | ICD-10-CM | POA: Diagnosis not present

## 2016-06-12 DIAGNOSIS — I83022 Varicose veins of left lower extremity with ulcer of calf: Secondary | ICD-10-CM | POA: Diagnosis not present

## 2016-06-12 DIAGNOSIS — L97921 Non-pressure chronic ulcer of unspecified part of left lower leg limited to breakdown of skin: Secondary | ICD-10-CM | POA: Diagnosis not present

## 2016-06-18 DIAGNOSIS — J309 Allergic rhinitis, unspecified: Secondary | ICD-10-CM | POA: Diagnosis not present

## 2016-06-19 ENCOUNTER — Other Ambulatory Visit: Payer: Self-pay | Admitting: Gastroenterology

## 2016-06-25 DIAGNOSIS — J309 Allergic rhinitis, unspecified: Secondary | ICD-10-CM | POA: Diagnosis not present

## 2016-06-25 DIAGNOSIS — L918 Other hypertrophic disorders of the skin: Secondary | ICD-10-CM | POA: Diagnosis not present

## 2016-06-25 DIAGNOSIS — L97921 Non-pressure chronic ulcer of unspecified part of left lower leg limited to breakdown of skin: Secondary | ICD-10-CM | POA: Diagnosis not present

## 2016-06-27 DIAGNOSIS — R600 Localized edema: Secondary | ICD-10-CM | POA: Diagnosis not present

## 2016-06-27 DIAGNOSIS — S81802A Unspecified open wound, left lower leg, initial encounter: Secondary | ICD-10-CM | POA: Diagnosis not present

## 2016-07-02 DIAGNOSIS — Z7982 Long term (current) use of aspirin: Secondary | ICD-10-CM | POA: Diagnosis not present

## 2016-07-02 DIAGNOSIS — I87012 Postthrombotic syndrome with ulcer of left lower extremity: Secondary | ICD-10-CM | POA: Diagnosis not present

## 2016-07-02 DIAGNOSIS — E039 Hypothyroidism, unspecified: Secondary | ICD-10-CM | POA: Diagnosis not present

## 2016-07-02 DIAGNOSIS — Z79899 Other long term (current) drug therapy: Secondary | ICD-10-CM | POA: Diagnosis not present

## 2016-07-02 DIAGNOSIS — Z7984 Long term (current) use of oral hypoglycemic drugs: Secondary | ICD-10-CM | POA: Diagnosis not present

## 2016-07-02 DIAGNOSIS — E119 Type 2 diabetes mellitus without complications: Secondary | ICD-10-CM | POA: Diagnosis not present

## 2016-07-02 DIAGNOSIS — L97322 Non-pressure chronic ulcer of left ankle with fat layer exposed: Secondary | ICD-10-CM | POA: Diagnosis not present

## 2016-07-02 DIAGNOSIS — Z888 Allergy status to other drugs, medicaments and biological substances status: Secondary | ICD-10-CM | POA: Diagnosis not present

## 2016-07-02 DIAGNOSIS — Z880 Allergy status to penicillin: Secondary | ICD-10-CM | POA: Diagnosis not present

## 2016-07-02 DIAGNOSIS — I10 Essential (primary) hypertension: Secondary | ICD-10-CM | POA: Diagnosis not present

## 2016-07-02 DIAGNOSIS — Z881 Allergy status to other antibiotic agents status: Secondary | ICD-10-CM | POA: Diagnosis not present

## 2016-07-02 DIAGNOSIS — L97328 Non-pressure chronic ulcer of left ankle with other specified severity: Secondary | ICD-10-CM | POA: Diagnosis not present

## 2016-07-02 DIAGNOSIS — L84 Corns and callosities: Secondary | ICD-10-CM | POA: Diagnosis not present

## 2016-07-03 ENCOUNTER — Ambulatory Visit: Payer: Medicare Other | Admitting: Gastroenterology

## 2016-07-03 DIAGNOSIS — E538 Deficiency of other specified B group vitamins: Secondary | ICD-10-CM | POA: Diagnosis not present

## 2016-07-08 DIAGNOSIS — E118 Type 2 diabetes mellitus with unspecified complications: Secondary | ICD-10-CM | POA: Diagnosis not present

## 2016-07-08 DIAGNOSIS — E538 Deficiency of other specified B group vitamins: Secondary | ICD-10-CM | POA: Diagnosis not present

## 2016-07-08 DIAGNOSIS — I10 Essential (primary) hypertension: Secondary | ICD-10-CM | POA: Diagnosis not present

## 2016-07-08 DIAGNOSIS — E7800 Pure hypercholesterolemia, unspecified: Secondary | ICD-10-CM | POA: Diagnosis not present

## 2016-07-08 DIAGNOSIS — E039 Hypothyroidism, unspecified: Secondary | ICD-10-CM | POA: Diagnosis not present

## 2016-07-08 DIAGNOSIS — Z9109 Other allergy status, other than to drugs and biological substances: Secondary | ICD-10-CM | POA: Diagnosis not present

## 2016-07-10 DIAGNOSIS — I87012 Postthrombotic syndrome with ulcer of left lower extremity: Secondary | ICD-10-CM | POA: Diagnosis not present

## 2016-07-10 DIAGNOSIS — L97322 Non-pressure chronic ulcer of left ankle with fat layer exposed: Secondary | ICD-10-CM | POA: Diagnosis not present

## 2016-07-14 DIAGNOSIS — E119 Type 2 diabetes mellitus without complications: Secondary | ICD-10-CM | POA: Diagnosis not present

## 2016-07-14 DIAGNOSIS — Z0000 Encounter for general adult medical examination without abnormal findings: Secondary | ICD-10-CM | POA: Diagnosis not present

## 2016-07-14 DIAGNOSIS — I10 Essential (primary) hypertension: Secondary | ICD-10-CM | POA: Diagnosis not present

## 2016-07-14 DIAGNOSIS — E7800 Pure hypercholesterolemia, unspecified: Secondary | ICD-10-CM | POA: Diagnosis not present

## 2016-07-14 DIAGNOSIS — E039 Hypothyroidism, unspecified: Secondary | ICD-10-CM | POA: Diagnosis not present

## 2016-07-14 DIAGNOSIS — J309 Allergic rhinitis, unspecified: Secondary | ICD-10-CM | POA: Diagnosis not present

## 2016-07-15 DIAGNOSIS — J301 Allergic rhinitis due to pollen: Secondary | ICD-10-CM | POA: Diagnosis not present

## 2016-07-15 DIAGNOSIS — J3089 Other allergic rhinitis: Secondary | ICD-10-CM | POA: Diagnosis not present

## 2016-07-17 DIAGNOSIS — I872 Venous insufficiency (chronic) (peripheral): Secondary | ICD-10-CM | POA: Diagnosis not present

## 2016-07-17 DIAGNOSIS — B0229 Other postherpetic nervous system involvement: Secondary | ICD-10-CM | POA: Diagnosis not present

## 2016-07-17 DIAGNOSIS — L97322 Non-pressure chronic ulcer of left ankle with fat layer exposed: Secondary | ICD-10-CM | POA: Diagnosis not present

## 2016-07-17 DIAGNOSIS — I87012 Postthrombotic syndrome with ulcer of left lower extremity: Secondary | ICD-10-CM | POA: Diagnosis not present

## 2016-07-22 DIAGNOSIS — M79676 Pain in unspecified toe(s): Secondary | ICD-10-CM | POA: Diagnosis not present

## 2016-07-22 DIAGNOSIS — B351 Tinea unguium: Secondary | ICD-10-CM | POA: Diagnosis not present

## 2016-07-22 DIAGNOSIS — E1151 Type 2 diabetes mellitus with diabetic peripheral angiopathy without gangrene: Secondary | ICD-10-CM | POA: Diagnosis not present

## 2016-07-23 DIAGNOSIS — Z9109 Other allergy status, other than to drugs and biological substances: Secondary | ICD-10-CM | POA: Diagnosis not present

## 2016-07-31 DIAGNOSIS — Z9109 Other allergy status, other than to drugs and biological substances: Secondary | ICD-10-CM | POA: Diagnosis not present

## 2016-08-07 DIAGNOSIS — E538 Deficiency of other specified B group vitamins: Secondary | ICD-10-CM | POA: Diagnosis not present

## 2016-08-07 DIAGNOSIS — J309 Allergic rhinitis, unspecified: Secondary | ICD-10-CM | POA: Diagnosis not present

## 2016-08-14 DIAGNOSIS — J309 Allergic rhinitis, unspecified: Secondary | ICD-10-CM | POA: Diagnosis not present

## 2016-08-20 DIAGNOSIS — H04123 Dry eye syndrome of bilateral lacrimal glands: Secondary | ICD-10-CM | POA: Diagnosis not present

## 2016-08-20 DIAGNOSIS — H401132 Primary open-angle glaucoma, bilateral, moderate stage: Secondary | ICD-10-CM | POA: Diagnosis not present

## 2016-08-20 DIAGNOSIS — E119 Type 2 diabetes mellitus without complications: Secondary | ICD-10-CM | POA: Diagnosis not present

## 2016-08-21 DIAGNOSIS — Z9109 Other allergy status, other than to drugs and biological substances: Secondary | ICD-10-CM | POA: Diagnosis not present

## 2016-08-28 DIAGNOSIS — T7840XD Allergy, unspecified, subsequent encounter: Secondary | ICD-10-CM | POA: Diagnosis not present

## 2016-09-04 DIAGNOSIS — E538 Deficiency of other specified B group vitamins: Secondary | ICD-10-CM | POA: Diagnosis not present

## 2016-09-11 DIAGNOSIS — T7840XD Allergy, unspecified, subsequent encounter: Secondary | ICD-10-CM | POA: Diagnosis not present

## 2016-09-18 DIAGNOSIS — T7840XD Allergy, unspecified, subsequent encounter: Secondary | ICD-10-CM | POA: Diagnosis not present

## 2016-09-25 DIAGNOSIS — Z9109 Other allergy status, other than to drugs and biological substances: Secondary | ICD-10-CM | POA: Diagnosis not present

## 2016-09-30 DIAGNOSIS — M79676 Pain in unspecified toe(s): Secondary | ICD-10-CM | POA: Diagnosis not present

## 2016-09-30 DIAGNOSIS — L84 Corns and callosities: Secondary | ICD-10-CM | POA: Diagnosis not present

## 2016-09-30 DIAGNOSIS — E1151 Type 2 diabetes mellitus with diabetic peripheral angiopathy without gangrene: Secondary | ICD-10-CM | POA: Diagnosis not present

## 2016-09-30 DIAGNOSIS — B351 Tinea unguium: Secondary | ICD-10-CM | POA: Diagnosis not present

## 2016-10-02 DIAGNOSIS — J309 Allergic rhinitis, unspecified: Secondary | ICD-10-CM | POA: Diagnosis not present

## 2016-10-09 DIAGNOSIS — E538 Deficiency of other specified B group vitamins: Secondary | ICD-10-CM | POA: Diagnosis not present

## 2016-10-09 DIAGNOSIS — J309 Allergic rhinitis, unspecified: Secondary | ICD-10-CM | POA: Diagnosis not present

## 2016-10-16 DIAGNOSIS — J309 Allergic rhinitis, unspecified: Secondary | ICD-10-CM | POA: Diagnosis not present

## 2016-10-20 DIAGNOSIS — H60321 Hemorrhagic otitis externa, right ear: Secondary | ICD-10-CM | POA: Diagnosis not present

## 2016-10-23 DIAGNOSIS — J309 Allergic rhinitis, unspecified: Secondary | ICD-10-CM | POA: Diagnosis not present

## 2016-10-30 DIAGNOSIS — J309 Allergic rhinitis, unspecified: Secondary | ICD-10-CM | POA: Diagnosis not present

## 2016-11-06 DIAGNOSIS — E538 Deficiency of other specified B group vitamins: Secondary | ICD-10-CM | POA: Diagnosis not present

## 2016-11-06 DIAGNOSIS — J309 Allergic rhinitis, unspecified: Secondary | ICD-10-CM | POA: Diagnosis not present

## 2016-11-13 DIAGNOSIS — J309 Allergic rhinitis, unspecified: Secondary | ICD-10-CM | POA: Diagnosis not present

## 2016-11-21 DIAGNOSIS — T7840XD Allergy, unspecified, subsequent encounter: Secondary | ICD-10-CM | POA: Diagnosis not present

## 2016-11-27 DIAGNOSIS — J309 Allergic rhinitis, unspecified: Secondary | ICD-10-CM | POA: Diagnosis not present

## 2016-12-04 DIAGNOSIS — R350 Frequency of micturition: Secondary | ICD-10-CM | POA: Diagnosis not present

## 2016-12-04 DIAGNOSIS — E538 Deficiency of other specified B group vitamins: Secondary | ICD-10-CM | POA: Diagnosis not present

## 2016-12-09 DIAGNOSIS — B351 Tinea unguium: Secondary | ICD-10-CM | POA: Diagnosis not present

## 2016-12-09 DIAGNOSIS — M79676 Pain in unspecified toe(s): Secondary | ICD-10-CM | POA: Diagnosis not present

## 2016-12-09 DIAGNOSIS — E1151 Type 2 diabetes mellitus with diabetic peripheral angiopathy without gangrene: Secondary | ICD-10-CM | POA: Diagnosis not present

## 2016-12-09 DIAGNOSIS — L84 Corns and callosities: Secondary | ICD-10-CM | POA: Diagnosis not present

## 2016-12-11 DIAGNOSIS — Z23 Encounter for immunization: Secondary | ICD-10-CM | POA: Diagnosis not present

## 2016-12-15 DIAGNOSIS — J3089 Other allergic rhinitis: Secondary | ICD-10-CM | POA: Diagnosis not present

## 2016-12-15 DIAGNOSIS — J301 Allergic rhinitis due to pollen: Secondary | ICD-10-CM | POA: Diagnosis not present

## 2016-12-18 DIAGNOSIS — T7840XA Allergy, unspecified, initial encounter: Secondary | ICD-10-CM | POA: Diagnosis not present

## 2016-12-22 DIAGNOSIS — H04123 Dry eye syndrome of bilateral lacrimal glands: Secondary | ICD-10-CM | POA: Diagnosis not present

## 2016-12-22 DIAGNOSIS — H401132 Primary open-angle glaucoma, bilateral, moderate stage: Secondary | ICD-10-CM | POA: Diagnosis not present

## 2016-12-26 DIAGNOSIS — Z9109 Other allergy status, other than to drugs and biological substances: Secondary | ICD-10-CM | POA: Diagnosis not present

## 2017-01-02 DIAGNOSIS — T7840XD Allergy, unspecified, subsequent encounter: Secondary | ICD-10-CM | POA: Diagnosis not present

## 2017-01-08 DIAGNOSIS — E538 Deficiency of other specified B group vitamins: Secondary | ICD-10-CM | POA: Diagnosis not present

## 2017-01-08 DIAGNOSIS — J309 Allergic rhinitis, unspecified: Secondary | ICD-10-CM | POA: Diagnosis not present

## 2017-01-12 DIAGNOSIS — E7800 Pure hypercholesterolemia, unspecified: Secondary | ICD-10-CM | POA: Diagnosis not present

## 2017-01-12 DIAGNOSIS — E039 Hypothyroidism, unspecified: Secondary | ICD-10-CM | POA: Diagnosis not present

## 2017-01-12 DIAGNOSIS — R739 Hyperglycemia, unspecified: Secondary | ICD-10-CM | POA: Diagnosis not present

## 2017-01-12 DIAGNOSIS — I10 Essential (primary) hypertension: Secondary | ICD-10-CM | POA: Diagnosis not present

## 2017-01-15 ENCOUNTER — Other Ambulatory Visit: Payer: Self-pay | Admitting: Gastroenterology

## 2017-01-15 DIAGNOSIS — K8681 Exocrine pancreatic insufficiency: Secondary | ICD-10-CM | POA: Diagnosis not present

## 2017-01-15 DIAGNOSIS — E118 Type 2 diabetes mellitus with unspecified complications: Secondary | ICD-10-CM | POA: Diagnosis not present

## 2017-01-15 DIAGNOSIS — I10 Essential (primary) hypertension: Secondary | ICD-10-CM | POA: Diagnosis not present

## 2017-01-15 DIAGNOSIS — E039 Hypothyroidism, unspecified: Secondary | ICD-10-CM | POA: Diagnosis not present

## 2017-01-28 DIAGNOSIS — J309 Allergic rhinitis, unspecified: Secondary | ICD-10-CM | POA: Diagnosis not present

## 2017-02-05 DIAGNOSIS — T7840XD Allergy, unspecified, subsequent encounter: Secondary | ICD-10-CM | POA: Diagnosis not present

## 2017-02-05 DIAGNOSIS — E538 Deficiency of other specified B group vitamins: Secondary | ICD-10-CM | POA: Diagnosis not present

## 2017-02-12 DIAGNOSIS — J309 Allergic rhinitis, unspecified: Secondary | ICD-10-CM | POA: Diagnosis not present

## 2017-02-17 DIAGNOSIS — N3001 Acute cystitis with hematuria: Secondary | ICD-10-CM | POA: Diagnosis not present

## 2017-02-17 DIAGNOSIS — R82998 Other abnormal findings in urine: Secondary | ICD-10-CM | POA: Diagnosis not present

## 2017-02-17 DIAGNOSIS — R350 Frequency of micturition: Secondary | ICD-10-CM | POA: Diagnosis not present

## 2017-02-19 DIAGNOSIS — M79676 Pain in unspecified toe(s): Secondary | ICD-10-CM | POA: Diagnosis not present

## 2017-02-19 DIAGNOSIS — E1151 Type 2 diabetes mellitus with diabetic peripheral angiopathy without gangrene: Secondary | ICD-10-CM | POA: Diagnosis not present

## 2017-02-19 DIAGNOSIS — L84 Corns and callosities: Secondary | ICD-10-CM | POA: Diagnosis not present

## 2017-02-19 DIAGNOSIS — B351 Tinea unguium: Secondary | ICD-10-CM | POA: Diagnosis not present

## 2017-03-05 DIAGNOSIS — J3089 Other allergic rhinitis: Secondary | ICD-10-CM | POA: Diagnosis not present

## 2017-03-12 DIAGNOSIS — E538 Deficiency of other specified B group vitamins: Secondary | ICD-10-CM | POA: Diagnosis not present

## 2017-03-12 DIAGNOSIS — T7840XD Allergy, unspecified, subsequent encounter: Secondary | ICD-10-CM | POA: Diagnosis not present

## 2017-03-19 DIAGNOSIS — T7840XD Allergy, unspecified, subsequent encounter: Secondary | ICD-10-CM | POA: Diagnosis not present

## 2017-03-19 DIAGNOSIS — J45909 Unspecified asthma, uncomplicated: Secondary | ICD-10-CM | POA: Diagnosis not present

## 2017-03-26 DIAGNOSIS — J45909 Unspecified asthma, uncomplicated: Secondary | ICD-10-CM | POA: Diagnosis not present

## 2017-04-02 DIAGNOSIS — J45909 Unspecified asthma, uncomplicated: Secondary | ICD-10-CM | POA: Diagnosis not present

## 2017-04-09 DIAGNOSIS — E538 Deficiency of other specified B group vitamins: Secondary | ICD-10-CM | POA: Diagnosis not present

## 2017-04-09 DIAGNOSIS — T7840XD Allergy, unspecified, subsequent encounter: Secondary | ICD-10-CM | POA: Diagnosis not present

## 2017-04-17 DIAGNOSIS — J301 Allergic rhinitis due to pollen: Secondary | ICD-10-CM | POA: Diagnosis not present

## 2017-04-17 DIAGNOSIS — J3089 Other allergic rhinitis: Secondary | ICD-10-CM | POA: Diagnosis not present

## 2017-04-17 DIAGNOSIS — T783XXD Angioneurotic edema, subsequent encounter: Secondary | ICD-10-CM | POA: Diagnosis not present

## 2017-04-17 DIAGNOSIS — L509 Urticaria, unspecified: Secondary | ICD-10-CM | POA: Diagnosis not present

## 2017-04-17 DIAGNOSIS — T7840XS Allergy, unspecified, sequela: Secondary | ICD-10-CM | POA: Diagnosis not present

## 2017-04-23 DIAGNOSIS — J45909 Unspecified asthma, uncomplicated: Secondary | ICD-10-CM | POA: Diagnosis not present

## 2017-04-23 DIAGNOSIS — H04123 Dry eye syndrome of bilateral lacrimal glands: Secondary | ICD-10-CM | POA: Diagnosis not present

## 2017-04-23 DIAGNOSIS — H401132 Primary open-angle glaucoma, bilateral, moderate stage: Secondary | ICD-10-CM | POA: Diagnosis not present

## 2017-04-23 DIAGNOSIS — E119 Type 2 diabetes mellitus without complications: Secondary | ICD-10-CM | POA: Diagnosis not present

## 2017-04-28 DIAGNOSIS — M79674 Pain in right toe(s): Secondary | ICD-10-CM | POA: Diagnosis not present

## 2017-04-28 DIAGNOSIS — B351 Tinea unguium: Secondary | ICD-10-CM | POA: Diagnosis not present

## 2017-04-28 DIAGNOSIS — E1151 Type 2 diabetes mellitus with diabetic peripheral angiopathy without gangrene: Secondary | ICD-10-CM | POA: Diagnosis not present

## 2017-04-28 DIAGNOSIS — L84 Corns and callosities: Secondary | ICD-10-CM | POA: Diagnosis not present

## 2017-04-28 DIAGNOSIS — M79675 Pain in left toe(s): Secondary | ICD-10-CM | POA: Diagnosis not present

## 2017-04-30 DIAGNOSIS — Z23 Encounter for immunization: Secondary | ICD-10-CM | POA: Diagnosis not present

## 2017-05-07 DIAGNOSIS — J45909 Unspecified asthma, uncomplicated: Secondary | ICD-10-CM | POA: Diagnosis not present

## 2017-05-14 DIAGNOSIS — J45909 Unspecified asthma, uncomplicated: Secondary | ICD-10-CM | POA: Diagnosis not present

## 2017-05-21 DIAGNOSIS — Z23 Encounter for immunization: Secondary | ICD-10-CM | POA: Diagnosis not present

## 2017-05-28 DIAGNOSIS — T7840XS Allergy, unspecified, sequela: Secondary | ICD-10-CM | POA: Diagnosis not present

## 2017-06-04 DIAGNOSIS — E538 Deficiency of other specified B group vitamins: Secondary | ICD-10-CM | POA: Diagnosis not present

## 2017-06-04 DIAGNOSIS — Z23 Encounter for immunization: Secondary | ICD-10-CM | POA: Diagnosis not present

## 2017-06-09 DIAGNOSIS — J3089 Other allergic rhinitis: Secondary | ICD-10-CM | POA: Diagnosis not present

## 2017-06-09 DIAGNOSIS — J301 Allergic rhinitis due to pollen: Secondary | ICD-10-CM | POA: Diagnosis not present

## 2017-06-11 DIAGNOSIS — T7840XD Allergy, unspecified, subsequent encounter: Secondary | ICD-10-CM | POA: Diagnosis not present

## 2017-06-25 DIAGNOSIS — J3089 Other allergic rhinitis: Secondary | ICD-10-CM | POA: Diagnosis not present

## 2017-07-07 DIAGNOSIS — E1151 Type 2 diabetes mellitus with diabetic peripheral angiopathy without gangrene: Secondary | ICD-10-CM | POA: Diagnosis not present

## 2017-07-07 DIAGNOSIS — M79676 Pain in unspecified toe(s): Secondary | ICD-10-CM | POA: Diagnosis not present

## 2017-07-07 DIAGNOSIS — B351 Tinea unguium: Secondary | ICD-10-CM | POA: Diagnosis not present

## 2017-07-07 DIAGNOSIS — L84 Corns and callosities: Secondary | ICD-10-CM | POA: Diagnosis not present

## 2017-07-09 DIAGNOSIS — I10 Essential (primary) hypertension: Secondary | ICD-10-CM | POA: Diagnosis not present

## 2017-07-09 DIAGNOSIS — E7800 Pure hypercholesterolemia, unspecified: Secondary | ICD-10-CM | POA: Diagnosis not present

## 2017-07-09 DIAGNOSIS — J3089 Other allergic rhinitis: Secondary | ICD-10-CM | POA: Diagnosis not present

## 2017-07-09 DIAGNOSIS — E119 Type 2 diabetes mellitus without complications: Secondary | ICD-10-CM | POA: Diagnosis not present

## 2017-07-09 DIAGNOSIS — E538 Deficiency of other specified B group vitamins: Secondary | ICD-10-CM | POA: Diagnosis not present

## 2017-07-09 DIAGNOSIS — E039 Hypothyroidism, unspecified: Secondary | ICD-10-CM | POA: Diagnosis not present

## 2017-07-16 DIAGNOSIS — Z0000 Encounter for general adult medical examination without abnormal findings: Secondary | ICD-10-CM | POA: Diagnosis not present

## 2017-07-16 DIAGNOSIS — E118 Type 2 diabetes mellitus with unspecified complications: Secondary | ICD-10-CM | POA: Diagnosis not present

## 2017-07-16 DIAGNOSIS — E538 Deficiency of other specified B group vitamins: Secondary | ICD-10-CM | POA: Diagnosis not present

## 2017-07-16 DIAGNOSIS — I10 Essential (primary) hypertension: Secondary | ICD-10-CM | POA: Diagnosis not present

## 2017-07-16 DIAGNOSIS — T7840XD Allergy, unspecified, subsequent encounter: Secondary | ICD-10-CM | POA: Diagnosis not present

## 2017-07-16 DIAGNOSIS — E039 Hypothyroidism, unspecified: Secondary | ICD-10-CM | POA: Diagnosis not present

## 2017-07-23 DIAGNOSIS — T7840XD Allergy, unspecified, subsequent encounter: Secondary | ICD-10-CM | POA: Diagnosis not present

## 2017-07-30 DIAGNOSIS — J3089 Other allergic rhinitis: Secondary | ICD-10-CM | POA: Diagnosis not present

## 2017-08-06 DIAGNOSIS — E538 Deficiency of other specified B group vitamins: Secondary | ICD-10-CM | POA: Diagnosis not present

## 2017-08-06 DIAGNOSIS — J3089 Other allergic rhinitis: Secondary | ICD-10-CM | POA: Diagnosis not present

## 2017-08-12 DIAGNOSIS — J3089 Other allergic rhinitis: Secondary | ICD-10-CM | POA: Diagnosis not present

## 2017-08-20 DIAGNOSIS — J3089 Other allergic rhinitis: Secondary | ICD-10-CM | POA: Diagnosis not present

## 2017-08-27 DIAGNOSIS — J3089 Other allergic rhinitis: Secondary | ICD-10-CM | POA: Diagnosis not present

## 2017-09-03 DIAGNOSIS — J3089 Other allergic rhinitis: Secondary | ICD-10-CM | POA: Diagnosis not present

## 2017-09-03 DIAGNOSIS — E538 Deficiency of other specified B group vitamins: Secondary | ICD-10-CM | POA: Diagnosis not present

## 2017-09-10 DIAGNOSIS — J3089 Other allergic rhinitis: Secondary | ICD-10-CM | POA: Diagnosis not present

## 2017-09-15 DIAGNOSIS — E1151 Type 2 diabetes mellitus with diabetic peripheral angiopathy without gangrene: Secondary | ICD-10-CM | POA: Diagnosis not present

## 2017-09-15 DIAGNOSIS — L84 Corns and callosities: Secondary | ICD-10-CM | POA: Diagnosis not present

## 2017-09-15 DIAGNOSIS — M79676 Pain in unspecified toe(s): Secondary | ICD-10-CM | POA: Diagnosis not present

## 2017-09-15 DIAGNOSIS — B351 Tinea unguium: Secondary | ICD-10-CM | POA: Diagnosis not present

## 2017-09-17 DIAGNOSIS — J3089 Other allergic rhinitis: Secondary | ICD-10-CM | POA: Diagnosis not present

## 2017-09-24 DIAGNOSIS — J3089 Other allergic rhinitis: Secondary | ICD-10-CM | POA: Diagnosis not present

## 2017-09-27 ENCOUNTER — Other Ambulatory Visit: Payer: Self-pay

## 2017-09-27 ENCOUNTER — Encounter (HOSPITAL_COMMUNITY): Payer: Self-pay | Admitting: *Deleted

## 2017-09-27 ENCOUNTER — Emergency Department (HOSPITAL_COMMUNITY)
Admission: EM | Admit: 2017-09-27 | Discharge: 2017-09-27 | Disposition: A | Discharge status: Home / Self Care | Payer: PPO | Attending: Emergency Medicine | Admitting: Emergency Medicine

## 2017-09-27 ENCOUNTER — Emergency Department (HOSPITAL_COMMUNITY): Payer: PPO

## 2017-09-27 DIAGNOSIS — N390 Urinary tract infection, site not specified: Secondary | ICD-10-CM | POA: Diagnosis not present

## 2017-09-27 DIAGNOSIS — M545 Low back pain, unspecified: Secondary | ICD-10-CM

## 2017-09-27 DIAGNOSIS — Z79899 Other long term (current) drug therapy: Secondary | ICD-10-CM | POA: Insufficient documentation

## 2017-09-27 DIAGNOSIS — I10 Essential (primary) hypertension: Secondary | ICD-10-CM | POA: Insufficient documentation

## 2017-09-27 DIAGNOSIS — E039 Hypothyroidism, unspecified: Secondary | ICD-10-CM | POA: Insufficient documentation

## 2017-09-27 DIAGNOSIS — Z7984 Long term (current) use of oral hypoglycemic drugs: Secondary | ICD-10-CM | POA: Diagnosis not present

## 2017-09-27 DIAGNOSIS — E119 Type 2 diabetes mellitus without complications: Secondary | ICD-10-CM | POA: Insufficient documentation

## 2017-09-27 DIAGNOSIS — Z7982 Long term (current) use of aspirin: Secondary | ICD-10-CM | POA: Diagnosis not present

## 2017-09-27 DIAGNOSIS — E876 Hypokalemia: Secondary | ICD-10-CM | POA: Diagnosis not present

## 2017-09-27 DIAGNOSIS — E7800 Pure hypercholesterolemia, unspecified: Secondary | ICD-10-CM | POA: Insufficient documentation

## 2017-09-27 LAB — I-STAT CHEM 8, ED
BUN: 11 mg/dL (ref 8–23)
CALCIUM ION: 1.13 mmol/L — AB (ref 1.15–1.40)
Chloride: 98 mmol/L (ref 98–111)
Creatinine, Ser: 0.5 mg/dL (ref 0.44–1.00)
GLUCOSE: 114 mg/dL — AB (ref 70–99)
HCT: 37 % (ref 36.0–46.0)
HEMOGLOBIN: 12.6 g/dL (ref 12.0–15.0)
Potassium: 3.1 mmol/L — ABNORMAL LOW (ref 3.5–5.1)
Sodium: 136 mmol/L (ref 135–145)
TCO2: 22 mmol/L (ref 22–32)

## 2017-09-27 LAB — URINALYSIS, ROUTINE W REFLEX MICROSCOPIC
Bilirubin Urine: NEGATIVE
Glucose, UA: NEGATIVE mg/dL
Hgb urine dipstick: NEGATIVE
KETONES UR: NEGATIVE mg/dL
Nitrite: NEGATIVE
PH: 6 (ref 5.0–8.0)
Protein, ur: NEGATIVE mg/dL
Specific Gravity, Urine: 1.017 (ref 1.005–1.030)

## 2017-09-27 MED ORDER — ONDANSETRON HCL 4 MG PO TABS
4.0000 mg | ORAL_TABLET | Freq: Once | ORAL | Status: AC
  Administered 2017-09-27: 4 mg via ORAL
  Filled 2017-09-27: qty 1

## 2017-09-27 MED ORDER — NITROFURANTOIN MONOHYD MACRO 100 MG PO CAPS
100.0000 mg | ORAL_CAPSULE | Freq: Once | ORAL | Status: AC
  Administered 2017-09-27: 100 mg via ORAL
  Filled 2017-09-27: qty 1

## 2017-09-27 MED ORDER — HYDROCODONE-ACETAMINOPHEN 5-325 MG PO TABS
1.0000 | ORAL_TABLET | Freq: Once | ORAL | Status: AC
  Administered 2017-09-27: 1 via ORAL
  Filled 2017-09-27: qty 1

## 2017-09-27 MED ORDER — NITROFURANTOIN MONOHYD MACRO 100 MG PO CAPS: 100.0000 mg | ORAL_CAPSULE | Freq: Two times a day (BID) | ORAL | 0 refills | Status: DC

## 2017-09-27 MED ORDER — POTASSIUM CHLORIDE CRYS ER 20 MEQ PO TBCR
20.0000 meq | EXTENDED_RELEASE_TABLET | Freq: Once | ORAL | Status: AC
  Administered 2017-09-27: 20 meq via ORAL
  Filled 2017-09-27: qty 1

## 2017-09-27 MED ORDER — HYDROCODONE-ACETAMINOPHEN 5-325 MG PO TABS: 1.0000 | ORAL_TABLET | Freq: Four times a day (QID) | ORAL | 0 refills | Status: DC | PRN

## 2017-09-27 NOTE — ED Provider Notes (Signed)
[facility] EMERGENCY DEPARTMENT Provider Note   CSN: [id number] Arrival date & time: 09/27/17  0756     History   Chief Complaint Chief Complaint  Patient presents with  . Back Pain    HPI Kathleen [name] is a 84 y.o. female.  Patient is an 84 year old female who presents to the emergency department with a complaint of back pain.  The patient and family states that this problem got worse on last night.  She has some back pain from time to time but got worse on last evening.  No recent fall or injury reported.  No lifting, pushing, or pulling.  There is been no high fevers reported.  No difficulty with urination, no increased urine frequency, no abdominal pain, no blood in stool, and no recent operations or procedures involving the back area.  The patient states that she has had some back pain when she had a urinary tract infection, and she is unsure if this is similar or not.  She presents to the emergency department for additional evaluation and management of this issue.  The history is provided by the patient.  Back Pain   Pertinent negatives include no chest pain, no fever, no abdominal pain and no dysuria.    Past Medical History:  Diagnosis Date  . Arthritis   . Diabetes mellitus without complication ([place])    "borderline"  . Diverticulosis of colon   . High cholesterol   . Hypertension   . Hypothyroidism   . Pancreatitis     Patient Active Problem List   Diagnosis Date Noted  . Fracture   . DM type 2 goal A1C below 7.5 01/16/2015  . Hip fracture, left ([place]) 01/15/2015  . Varicose veins of leg with complications 62/56/3893  . Exocrine pancreatic insufficiency ([place]) 11/03/2013  . Physical deconditioning 01/29/2012  . Elevated LFTs 01/29/2012  . Pancreatitis 01/27/2012  . Hyperlipidemia 01/27/2012  . Hypokalemia 01/27/2012  . Hypertension   . Hypothyroidism     Past Surgical History:  Procedure Laterality Date  . ABDOMINAL HYSTERECTOMY     partial  .  APPENDECTOMY    . bladder tac    . CHOLECYSTECTOMY  1999   cholelithiasis  . COLONOSCOPY  01/2007   Dr Olevia Perches: normal  . COLONOSCOPY  12/2000   Dr Deatra Ina- diverticulosis  . ESOPHAGOGASTRODUODENOSCOPY     Dr Damaris Hippo cannot remember  . EUS N/A 03/24/2012   Dr. Paulita Fujita: suspected passed CBD stone as cause of acute pancreatitis, suspective chronic pancreatitis.   Marland Kitchen EXCISION OF BREAST BIOPSY    . HIP PINNING,CANNULATED Left 01/15/2015   Procedure: CANNULATED HIP PINNING/LEFT;  Surgeon: Renette Butters, MD;  Location: [place];  Service: Orthopedics;  Laterality: Left;  Marland Kitchen VEIN SURGERY       OB History    Gravida  2   Para  2   Term  2   Preterm      AB      Living  1     SAB      TAB      Ectopic      Multiple      Live Births               Home Medications    Prior to Admission medications   Medication Sig Start Date End Date Taking? Authorizing Provider  amLODipine (NORVASC) 5 MG tablet Take 5 mg by mouth every morning.     [provider]  aspirin EC 325  MG tablet Take 1 tablet (325 mg total) by mouth daily. 01/15/15   Renette Butters, MD  aspirin EC 81 MG tablet Take 81 mg by mouth at bedtime.     [provider]  Calcium Carbonate (CALTRATE 600 PO) Take 600 tablets by mouth 2 (two) times daily.     [provider]  carboxymethylcellulose (REFRESH PLUS) 0.5 % SOLN 1 drop 3 (three) times daily as needed.    [provider]  COSOPT PF 22.3-6.8 MG/ML SOLN Place 1 drop into both eyes 2 (two) times daily.  10/11/13   [provider]  cyanocobalamin (,VITAMIN B-12,) 1000 MCG/ML injection Inject 1,000 mcg into the muscle every 30 (thirty) days.    [provider]  docusate sodium (COLACE) 100 MG capsule Take 1 capsule (100 mg total) by mouth 2 (two) times daily. Continue this while taking narcotics to help with bowel movements 01/15/15   Renette Butters, MD  fluticasone [facility]) 50 MCG/ACT nasal spray Place 1 spray  into both nostrils daily.    [provider]  hydrochlorothiazide (HYDRODIURIL) 25 MG tablet Take 25 mg by mouth every morning.     [provider]  HYDROcodone-acetaminophen (NORCO) 5-325 MG tablet Take 1-2 tablets by mouth every 6 (six) hours as needed for moderate pain. 01/16/15   Lovett Calender, PA-C  levocetirizine (XYZAL) 5 MG tablet Take 5 mg by mouth every evening.    [provider]  levothyroxine (SYNTHROID, LEVOTHROID) 88 MCG tablet 88 mcg daily. 10/26/13   [provider]  lisinopril (PRINIVIL,ZESTRIL) 2.5 MG tablet Take 2.5 mg by mouth daily.     [provider]  loperamide (IMODIUM) 2 MG capsule Take 2 mg by mouth as needed for diarrhea or loose stools.    [provider]  metFORMIN (GLUCOPHAGE-XR) 500 MG 24 hr tablet Take 500 mg by mouth daily with breakfast.     [provider]  montelukast (SINGULAIR) 10 MG tablet Take 10 mg by mouth at bedtime.    [provider]  Multiple Vitamin (MULTIVITAMIN WITH MINERALS) TABS tablet Take 1 tablet by mouth daily.    [provider]  Omega-3 Fatty Acids (FISH OIL) 500 MG CAPS Take 2 capsules by mouth daily.     [provider]  potassium chloride SA (K-DUR,KLOR-CON) 20 MEQ tablet Take 20 mEq by mouth 2 (two) times daily.     [provider]  ranitidine (ZANTAC) 150 MG tablet Take 150 mg by mouth 2 (two) times daily.    [provider]  ZENPEP 20000-63000 units CPEP TAKE ONE CAPSULE BY MOUTH THREE TIMES DAILY BEFORE MEALS 06/19/16   Carlis Stable, NP  ZENPEP 20000-63000 units CPEP TAKE ONE CAPSULE BY MOUTH THREE TIMES DAILY BEFORE MEALS 01/15/17   Gordy Levan, Eric A, NP  ZIOPTAN 0.0015 % SOLN Place 1 drop into both eyes at bedtime.  10/11/13   [provider]    Family History Family History  Problem Relation Age of Onset  . Diabetes Father   . Diabetes Mother   . Cancer Mother        ovarian  . Cancer Sister 69       breast  .  Cancer Sister 79       bone    Social History Social History   Tobacco Use  . Smoking status: Never Smoker  . Smokeless tobacco: Never Used  Substance Use Topics  . Alcohol use: No    Alcohol/week: 0.0 standard drinks  .  Drug use: No     Allergies   Alendronate; Cephalexin; Clindamycin/lincomycin; Creon [pancreatin]; Doxycycline; Florastor [yeast]; Penicillins; and Zenpep [pancrelipase (lip-prot-amyl)]   Review of Systems Review of Systems  Constitutional: Positive for activity change. Negative for appetite change, chills and fever.       All ROS Neg except as noted in HPI  HENT: Negative for nosebleeds.   Eyes: Negative for photophobia and discharge.  Respiratory: Negative for cough, shortness of breath and wheezing.   Cardiovascular: Negative for chest pain and palpitations.  Gastrointestinal: Negative for abdominal pain and blood in stool.  Genitourinary: Negative for dysuria, frequency and hematuria.  Musculoskeletal: Positive for arthralgias and back pain. Negative for neck pain.  Skin: Negative.   Neurological: Negative for dizziness, seizures and speech difficulty.  Psychiatric/Behavioral: Negative for confusion and hallucinations.     Physical Exam Updated Vital Signs BP (!) 153/75 (BP Location: Left Arm)   Pulse 93   Temp 98.1 F (36.7 C) (Oral)   Resp 20   SpO2 97%   Physical Exam  Constitutional: She is oriented to person, place, and time. She appears well-developed and well-nourished.  Non-toxic appearance.  HENT:  Head: Normocephalic.  Right Ear: Tympanic membrane and external ear normal.  Left Ear: Tympanic membrane and external ear normal.  Eyes: Pupils are equal, round, and reactive to light. EOM and lids are normal.  Neck: Normal range of motion. Neck supple. Carotid bruit is not present.  Cardiovascular: Normal rate, regular rhythm, normal heart sounds, intact distal pulses and normal pulses.  Pulmonary/Chest: Breath sounds normal. No  respiratory distress.  Abdominal: Soft. Bowel sounds are normal. There is no tenderness. There is no guarding.  Musculoskeletal: Normal range of motion.       Lumbar back: She exhibits pain.       Back:  Lymphadenopathy:       Head (right side): No submandibular adenopathy present.       Head (left side): No submandibular adenopathy present.    She has no cervical adenopathy.  Neurological: She is alert and oriented to person, place, and time. She has normal strength. No cranial nerve deficit or sensory deficit.  Skin: Skin is warm and dry.  Psychiatric: She has a normal mood and affect. Her speech is normal.  Nursing note and vitals reviewed.    ED Treatments / Results  Labs (all labs ordered are listed, but only abnormal results are displayed) Labs Reviewed - No data to display  EKG None  Radiology No results found.  Procedures Procedures (including critical care time)  Medications Ordered in ED Medications - No data to display   Initial Impression / Assessment and Plan / ED Course  I have reviewed the triage vital signs and the nursing notes.  Pertinent labs & imaging results that were available during my care of the patient were reviewed by me and considered in my medical decision making (see chart for details).       Final Clinical Impressions(s) / ED Diagnoses MDM  Vital signs are within normal limits.  Patient states that the pain that she is having has been similar to some pain that she had with a urinary tract infection.  The patient denies any recent injury or trauma to the lower back.  Pulse oximetry is 94 to 97% on room air.  Urine analysis shows a hazy yellow specimen with a specific gravity of 1.017.  There is a large leukocyte esterase with 6-10 red cells and 11-20 white blood cells.  A culture has been sent to the lab.  We will start the patient on Macrobid until cultures return.  I-STAT Chem-8 shows the potassium to be low at 3.1.  Patient has a  history of some low potassium and is currently on potassium supplement.  Additional potassium was given here in the emergency department orally. The lumbar spine x-ray shows no acute osseous abnormality.  There is spondylosis present, but this is unchanged from previous evaluations.  There is some mild loss of disc height at the L5-S1 area and there is some anterolisthesis at the L4-L5 area.  The patient will be treated with Macrobid for urinary tract infection.  I have given the patient information on foods high in potassium, and asked her to have her doctor to recheck the potassium soon since she is on potassium supplement and still has a low potassium on today's visit.  I have also asked her to use Tylenol extra strength for mild back pain, and I have given her 10 tablets of hydrocodone to use for more severe pain until she can see her primary physician.   Final diagnoses:  Acute bilateral low back pain without sciatica  Lower urinary tract infectious disease  Hypokalemia    ED Discharge Orders         Ordered    HYDROcodone-acetaminophen (NORCO/VICODIN) 5-325 MG tablet  Every 6 hours PRN     09/27/17 1113    nitrofurantoin, macrocrystal-monohydrate, (MACROBID) 100 MG capsule  2 times daily     09/27/17 1113           Lily Kocher, PA-C 09/27/17 [place], [place], DO 09/27/17 1550

## 2017-09-27 NOTE — Discharge Instructions (Signed)
Your blood pressure is elevated today.  Please have this rechecked soon.  Your potassium is low.  You were given potassium here in the emergency department.  Please have Kathleen [name] to recheck your potassium soon.  Your x-ray shows arthritis and degenerative disc disease changes.  There are no fractures noted of your lower back.  Your urine test shows an early urinary tract infection.  You have been placed on Macrobid 2 times daily with food.  Please take this medication daily until all taken.  Please have your urine rechecked in about 7 to 10 days to ensure that the infection has completely resolved.  Return to the emergency department if any emergent changes in your condition, problems, or concerns.  May use Tylenol extra strength for mild back pain.  May use hydrocodone for more severe back pain.  Hydrocodone may cause drowsiness, and/or constipation.  You may need to use a stool softener while taking this medication.

## 2017-09-27 NOTE — ED Triage Notes (Signed)
Pt c/o lower back pain that started last night, chronic bilateral knee pain worsening last night and diarrhea all day yesterday. Denies vomiting, abdominal pain, dysuria, hematuria. Pt reports some nausea this morning that has now passed.

## 2017-09-29 LAB — URINE CULTURE: Special Requests: NORMAL

## 2017-09-30 DIAGNOSIS — R197 Diarrhea, unspecified: Secondary | ICD-10-CM | POA: Diagnosis not present

## 2017-09-30 DIAGNOSIS — R799 Abnormal finding of blood chemistry, unspecified: Secondary | ICD-10-CM | POA: Diagnosis not present

## 2017-09-30 DIAGNOSIS — E876 Hypokalemia: Secondary | ICD-10-CM | POA: Diagnosis not present

## 2017-09-30 DIAGNOSIS — N390 Urinary tract infection, site not specified: Secondary | ICD-10-CM | POA: Diagnosis not present

## 2017-10-08 DIAGNOSIS — J3089 Other allergic rhinitis: Secondary | ICD-10-CM | POA: Diagnosis not present

## 2017-10-08 DIAGNOSIS — E538 Deficiency of other specified B group vitamins: Secondary | ICD-10-CM | POA: Diagnosis not present

## 2017-10-15 DIAGNOSIS — E876 Hypokalemia: Secondary | ICD-10-CM | POA: Diagnosis not present

## 2017-10-15 DIAGNOSIS — J3089 Other allergic rhinitis: Secondary | ICD-10-CM | POA: Diagnosis not present

## 2017-10-22 DIAGNOSIS — Z9109 Other allergy status, other than to drugs and biological substances: Secondary | ICD-10-CM | POA: Diagnosis not present

## 2017-10-26 DIAGNOSIS — E119 Type 2 diabetes mellitus without complications: Secondary | ICD-10-CM | POA: Diagnosis not present

## 2017-10-26 DIAGNOSIS — H04123 Dry eye syndrome of bilateral lacrimal glands: Secondary | ICD-10-CM | POA: Diagnosis not present

## 2017-10-26 DIAGNOSIS — H401132 Primary open-angle glaucoma, bilateral, moderate stage: Secondary | ICD-10-CM | POA: Diagnosis not present

## 2017-10-29 DIAGNOSIS — R799 Abnormal finding of blood chemistry, unspecified: Secondary | ICD-10-CM | POA: Diagnosis not present

## 2017-11-05 DIAGNOSIS — Z9109 Other allergy status, other than to drugs and biological substances: Secondary | ICD-10-CM | POA: Diagnosis not present

## 2017-11-05 DIAGNOSIS — E538 Deficiency of other specified B group vitamins: Secondary | ICD-10-CM | POA: Diagnosis not present

## 2017-11-06 DIAGNOSIS — J3089 Other allergic rhinitis: Secondary | ICD-10-CM | POA: Diagnosis not present

## 2017-11-06 DIAGNOSIS — J301 Allergic rhinitis due to pollen: Secondary | ICD-10-CM | POA: Diagnosis not present

## 2017-11-12 DIAGNOSIS — Z9109 Other allergy status, other than to drugs and biological substances: Secondary | ICD-10-CM | POA: Diagnosis not present

## 2017-11-19 DIAGNOSIS — Z23 Encounter for immunization: Secondary | ICD-10-CM | POA: Diagnosis not present

## 2017-11-24 DIAGNOSIS — B351 Tinea unguium: Secondary | ICD-10-CM | POA: Diagnosis not present

## 2017-11-24 DIAGNOSIS — L84 Corns and callosities: Secondary | ICD-10-CM | POA: Diagnosis not present

## 2017-11-24 DIAGNOSIS — E1151 Type 2 diabetes mellitus with diabetic peripheral angiopathy without gangrene: Secondary | ICD-10-CM | POA: Diagnosis not present

## 2017-11-24 DIAGNOSIS — M79676 Pain in unspecified toe(s): Secondary | ICD-10-CM | POA: Diagnosis not present

## 2017-11-26 DIAGNOSIS — Z0182 Encounter for allergy testing: Secondary | ICD-10-CM | POA: Diagnosis not present

## 2017-12-03 DIAGNOSIS — Z0489 Encounter for examination and observation for other specified reasons: Secondary | ICD-10-CM | POA: Diagnosis not present

## 2017-12-10 DIAGNOSIS — E538 Deficiency of other specified B group vitamins: Secondary | ICD-10-CM | POA: Diagnosis not present

## 2017-12-10 DIAGNOSIS — Z9109 Other allergy status, other than to drugs and biological substances: Secondary | ICD-10-CM | POA: Diagnosis not present

## 2017-12-17 DIAGNOSIS — H10413 Chronic giant papillary conjunctivitis, bilateral: Secondary | ICD-10-CM | POA: Diagnosis not present

## 2017-12-17 DIAGNOSIS — Z9109 Other allergy status, other than to drugs and biological substances: Secondary | ICD-10-CM | POA: Diagnosis not present

## 2017-12-24 DIAGNOSIS — Z9109 Other allergy status, other than to drugs and biological substances: Secondary | ICD-10-CM | POA: Diagnosis not present

## 2017-12-31 DIAGNOSIS — Z9109 Other allergy status, other than to drugs and biological substances: Secondary | ICD-10-CM | POA: Diagnosis not present

## 2018-01-06 DIAGNOSIS — Z9109 Other allergy status, other than to drugs and biological substances: Secondary | ICD-10-CM | POA: Diagnosis not present

## 2018-01-06 DIAGNOSIS — E538 Deficiency of other specified B group vitamins: Secondary | ICD-10-CM | POA: Diagnosis not present

## 2018-01-14 DIAGNOSIS — Z9109 Other allergy status, other than to drugs and biological substances: Secondary | ICD-10-CM | POA: Diagnosis not present

## 2018-01-21 DIAGNOSIS — Z9109 Other allergy status, other than to drugs and biological substances: Secondary | ICD-10-CM | POA: Diagnosis not present

## 2018-01-28 DIAGNOSIS — E118 Type 2 diabetes mellitus with unspecified complications: Secondary | ICD-10-CM | POA: Diagnosis not present

## 2018-01-28 DIAGNOSIS — Z9109 Other allergy status, other than to drugs and biological substances: Secondary | ICD-10-CM | POA: Diagnosis not present

## 2018-01-28 DIAGNOSIS — I10 Essential (primary) hypertension: Secondary | ICD-10-CM | POA: Diagnosis not present

## 2018-01-28 DIAGNOSIS — E039 Hypothyroidism, unspecified: Secondary | ICD-10-CM | POA: Diagnosis not present

## 2018-01-28 DIAGNOSIS — E7800 Pure hypercholesterolemia, unspecified: Secondary | ICD-10-CM | POA: Diagnosis not present

## 2018-02-04 DIAGNOSIS — E538 Deficiency of other specified B group vitamins: Secondary | ICD-10-CM | POA: Diagnosis not present

## 2018-02-04 DIAGNOSIS — Z9109 Other allergy status, other than to drugs and biological substances: Secondary | ICD-10-CM | POA: Diagnosis not present

## 2018-02-04 DIAGNOSIS — I10 Essential (primary) hypertension: Secondary | ICD-10-CM | POA: Diagnosis not present

## 2018-02-04 DIAGNOSIS — E7800 Pure hypercholesterolemia, unspecified: Secondary | ICD-10-CM | POA: Diagnosis not present

## 2018-02-04 DIAGNOSIS — K8681 Exocrine pancreatic insufficiency: Secondary | ICD-10-CM | POA: Diagnosis not present

## 2018-02-04 DIAGNOSIS — E119 Type 2 diabetes mellitus without complications: Secondary | ICD-10-CM | POA: Diagnosis not present

## 2018-02-04 DIAGNOSIS — E039 Hypothyroidism, unspecified: Secondary | ICD-10-CM | POA: Diagnosis not present

## 2018-02-04 DIAGNOSIS — T7840XD Allergy, unspecified, subsequent encounter: Secondary | ICD-10-CM | POA: Diagnosis not present

## 2018-02-04 DIAGNOSIS — J3089 Other allergic rhinitis: Secondary | ICD-10-CM | POA: Diagnosis not present

## 2018-02-09 DIAGNOSIS — L84 Corns and callosities: Secondary | ICD-10-CM | POA: Diagnosis not present

## 2018-02-09 DIAGNOSIS — E1151 Type 2 diabetes mellitus with diabetic peripheral angiopathy without gangrene: Secondary | ICD-10-CM | POA: Diagnosis not present

## 2018-02-09 DIAGNOSIS — B351 Tinea unguium: Secondary | ICD-10-CM | POA: Diagnosis not present

## 2018-02-09 DIAGNOSIS — M79676 Pain in unspecified toe(s): Secondary | ICD-10-CM | POA: Diagnosis not present

## 2018-02-11 DIAGNOSIS — E538 Deficiency of other specified B group vitamins: Secondary | ICD-10-CM | POA: Diagnosis not present

## 2018-02-11 DIAGNOSIS — Z9109 Other allergy status, other than to drugs and biological substances: Secondary | ICD-10-CM | POA: Diagnosis not present

## 2018-02-18 DIAGNOSIS — Z9109 Other allergy status, other than to drugs and biological substances: Secondary | ICD-10-CM | POA: Diagnosis not present

## 2018-02-25 DIAGNOSIS — H401132 Primary open-angle glaucoma, bilateral, moderate stage: Secondary | ICD-10-CM | POA: Diagnosis not present

## 2018-02-25 DIAGNOSIS — Z9109 Other allergy status, other than to drugs and biological substances: Secondary | ICD-10-CM | POA: Diagnosis not present

## 2018-02-25 DIAGNOSIS — H04123 Dry eye syndrome of bilateral lacrimal glands: Secondary | ICD-10-CM | POA: Diagnosis not present

## 2018-02-25 DIAGNOSIS — E119 Type 2 diabetes mellitus without complications: Secondary | ICD-10-CM | POA: Diagnosis not present

## 2018-03-04 DIAGNOSIS — Z9109 Other allergy status, other than to drugs and biological substances: Secondary | ICD-10-CM | POA: Diagnosis not present

## 2018-03-11 DIAGNOSIS — Z9109 Other allergy status, other than to drugs and biological substances: Secondary | ICD-10-CM | POA: Diagnosis not present

## 2018-03-19 DIAGNOSIS — Z9109 Other allergy status, other than to drugs and biological substances: Secondary | ICD-10-CM | POA: Diagnosis not present

## 2018-03-25 DIAGNOSIS — Z9109 Other allergy status, other than to drugs and biological substances: Secondary | ICD-10-CM | POA: Diagnosis not present

## 2018-03-26 DIAGNOSIS — J3089 Other allergic rhinitis: Secondary | ICD-10-CM | POA: Diagnosis not present

## 2018-03-26 DIAGNOSIS — J301 Allergic rhinitis due to pollen: Secondary | ICD-10-CM | POA: Diagnosis not present

## 2018-04-01 DIAGNOSIS — Z9109 Other allergy status, other than to drugs and biological substances: Secondary | ICD-10-CM | POA: Diagnosis not present

## 2018-04-08 DIAGNOSIS — Z9109 Other allergy status, other than to drugs and biological substances: Secondary | ICD-10-CM | POA: Diagnosis not present

## 2018-04-15 DIAGNOSIS — Z9109 Other allergy status, other than to drugs and biological substances: Secondary | ICD-10-CM | POA: Diagnosis not present

## 2018-04-21 DIAGNOSIS — R6883 Chills (without fever): Secondary | ICD-10-CM | POA: Diagnosis not present

## 2018-04-21 DIAGNOSIS — R82998 Other abnormal findings in urine: Secondary | ICD-10-CM | POA: Diagnosis not present

## 2018-04-24 ENCOUNTER — Inpatient Hospital Stay (HOSPITAL_COMMUNITY)
Admission: EM | Admit: 2018-04-24 | Discharge: 2018-04-27 | DRG: 640 | Disposition: A | Discharge status: Skilled Nursing Facility | Payer: PPO | Attending: Internal Medicine | Admitting: Internal Medicine

## 2018-04-24 ENCOUNTER — Encounter (HOSPITAL_COMMUNITY): Payer: Self-pay | Admitting: *Deleted

## 2018-04-24 ENCOUNTER — Emergency Department (HOSPITAL_COMMUNITY): Payer: PPO

## 2018-04-24 ENCOUNTER — Other Ambulatory Visit: Payer: Self-pay

## 2018-04-24 DIAGNOSIS — J209 Acute bronchitis, unspecified: Secondary | ICD-10-CM | POA: Insufficient documentation

## 2018-04-24 DIAGNOSIS — I10 Essential (primary) hypertension: Secondary | ICD-10-CM | POA: Diagnosis not present

## 2018-04-24 DIAGNOSIS — Z808 Family history of malignant neoplasm of other organs or systems: Secondary | ICD-10-CM

## 2018-04-24 DIAGNOSIS — R41841 Cognitive communication deficit: Secondary | ICD-10-CM | POA: Diagnosis not present

## 2018-04-24 DIAGNOSIS — R410 Disorientation, unspecified: Secondary | ICD-10-CM | POA: Diagnosis not present

## 2018-04-24 DIAGNOSIS — K861 Other chronic pancreatitis: Secondary | ICD-10-CM | POA: Diagnosis not present

## 2018-04-24 DIAGNOSIS — Z7982 Long term (current) use of aspirin: Secondary | ICD-10-CM

## 2018-04-24 DIAGNOSIS — Z833 Family history of diabetes mellitus: Secondary | ICD-10-CM

## 2018-04-24 DIAGNOSIS — E785 Hyperlipidemia, unspecified: Secondary | ICD-10-CM | POA: Diagnosis not present

## 2018-04-24 DIAGNOSIS — R4182 Altered mental status, unspecified: Secondary | ICD-10-CM | POA: Diagnosis not present

## 2018-04-24 DIAGNOSIS — R0902 Hypoxemia: Secondary | ICD-10-CM | POA: Diagnosis not present

## 2018-04-24 DIAGNOSIS — R443 Hallucinations, unspecified: Secondary | ICD-10-CM | POA: Diagnosis present

## 2018-04-24 DIAGNOSIS — Z880 Allergy status to penicillin: Secondary | ICD-10-CM

## 2018-04-24 DIAGNOSIS — R441 Visual hallucinations: Secondary | ICD-10-CM | POA: Diagnosis not present

## 2018-04-24 DIAGNOSIS — Z96642 Presence of left artificial hip joint: Secondary | ICD-10-CM | POA: Diagnosis not present

## 2018-04-24 DIAGNOSIS — L259 Unspecified contact dermatitis, unspecified cause: Secondary | ICD-10-CM | POA: Insufficient documentation

## 2018-04-24 DIAGNOSIS — E7800 Pure hypercholesterolemia, unspecified: Secondary | ICD-10-CM | POA: Diagnosis present

## 2018-04-24 DIAGNOSIS — R262 Difficulty in walking, not elsewhere classified: Secondary | ICD-10-CM | POA: Diagnosis not present

## 2018-04-24 DIAGNOSIS — N390 Urinary tract infection, site not specified: Secondary | ICD-10-CM | POA: Diagnosis not present

## 2018-04-24 DIAGNOSIS — Z803 Family history of malignant neoplasm of breast: Secondary | ICD-10-CM

## 2018-04-24 DIAGNOSIS — Z888 Allergy status to other drugs, medicaments and biological substances status: Secondary | ICD-10-CM | POA: Diagnosis not present

## 2018-04-24 DIAGNOSIS — E039 Hypothyroidism, unspecified: Secondary | ICD-10-CM | POA: Diagnosis not present

## 2018-04-24 DIAGNOSIS — E860 Dehydration: Secondary | ICD-10-CM | POA: Diagnosis present

## 2018-04-24 DIAGNOSIS — Z8041 Family history of malignant neoplasm of ovary: Secondary | ICD-10-CM | POA: Diagnosis not present

## 2018-04-24 DIAGNOSIS — G9341 Metabolic encephalopathy: Secondary | ICD-10-CM | POA: Diagnosis not present

## 2018-04-24 DIAGNOSIS — L309 Dermatitis, unspecified: Secondary | ICD-10-CM | POA: Diagnosis present

## 2018-04-24 DIAGNOSIS — E119 Type 2 diabetes mellitus without complications: Secondary | ICD-10-CM | POA: Diagnosis not present

## 2018-04-24 DIAGNOSIS — Z881 Allergy status to other antibiotic agents status: Secondary | ICD-10-CM | POA: Diagnosis not present

## 2018-04-24 DIAGNOSIS — E871 Hypo-osmolality and hyponatremia: Principal | ICD-10-CM | POA: Diagnosis present

## 2018-04-24 DIAGNOSIS — E1159 Type 2 diabetes mellitus with other circulatory complications: Secondary | ICD-10-CM | POA: Diagnosis present

## 2018-04-24 DIAGNOSIS — M1990 Unspecified osteoarthritis, unspecified site: Secondary | ICD-10-CM | POA: Diagnosis present

## 2018-04-24 DIAGNOSIS — R404 Transient alteration of awareness: Secondary | ICD-10-CM | POA: Diagnosis not present

## 2018-04-24 DIAGNOSIS — Z79890 Hormone replacement therapy (postmenopausal): Secondary | ICD-10-CM

## 2018-04-24 DIAGNOSIS — R110 Nausea: Secondary | ICD-10-CM | POA: Diagnosis not present

## 2018-04-24 DIAGNOSIS — K5730 Diverticulosis of large intestine without perforation or abscess without bleeding: Secondary | ICD-10-CM | POA: Diagnosis present

## 2018-04-24 DIAGNOSIS — Z7984 Long term (current) use of oral hypoglycemic drugs: Secondary | ICD-10-CM

## 2018-04-24 DIAGNOSIS — K5790 Diverticulosis of intestine, part unspecified, without perforation or abscess without bleeding: Secondary | ICD-10-CM | POA: Diagnosis not present

## 2018-04-24 DIAGNOSIS — Z7951 Long term (current) use of inhaled steroids: Secondary | ICD-10-CM

## 2018-04-24 DIAGNOSIS — M6281 Muscle weakness (generalized): Secondary | ICD-10-CM | POA: Diagnosis not present

## 2018-04-24 DIAGNOSIS — Z741 Need for assistance with personal care: Secondary | ICD-10-CM | POA: Diagnosis not present

## 2018-04-24 DIAGNOSIS — Z9181 History of falling: Secondary | ICD-10-CM | POA: Diagnosis not present

## 2018-04-24 LAB — URINALYSIS, ROUTINE W REFLEX MICROSCOPIC
BILIRUBIN URINE: NEGATIVE
Glucose, UA: NEGATIVE mg/dL
Hgb urine dipstick: NEGATIVE
KETONES UR: NEGATIVE mg/dL
LEUKOCYTE UA: NEGATIVE
NITRITE: NEGATIVE
PROTEIN: NEGATIVE mg/dL
Specific Gravity, Urine: 1.01 (ref 1.005–1.030)
pH: 6 (ref 5.0–8.0)

## 2018-04-24 MED ORDER — FENTANYL CITRATE (PF) 100 MCG/2ML IJ SOLN: 50.0000 ug | Freq: Once | INTRAMUSCULAR | Status: DC

## 2018-04-24 MED ORDER — ONDANSETRON HCL 4 MG/2ML IJ SOLN
4.0000 mg | Freq: Once | INTRAMUSCULAR | Status: AC
  Administered 2018-04-24: 4 mg via INTRAVENOUS
  Filled 2018-04-24: qty 2

## 2018-04-24 MED ORDER — SODIUM CHLORIDE 0.9 % IV BOLUS: 500.0000 mL | Freq: Once | INTRAVENOUS | Status: DC

## 2018-04-24 MED ORDER — ONDANSETRON HCL 4 MG/2ML IJ SOLN: 4.0000 mg | Freq: Once | INTRAMUSCULAR | Status: DC

## 2018-04-24 NOTE — ED Provider Notes (Signed)
[facility] EMERGENCY DEPARTMENT Provider Note   CSN: [id number] Arrival date & time: 04/24/18  2246  Time seen 23:20 PM  History   Chief Complaint Chief Complaint  Patient presents with   Urinary Tract Infection   Level 5 caveat for altered mental status  HPI Kathleen [name] is a 85 y.o. female.     HPI patient's granddaughter and her husband called EMS tonight.  They state the patient started hallucinating on March 11 when she thought there were bugs in her bathroom.  She was seen by her primary care doctor that day and diagnosed with UTI and started on Bactrim.  They state she continues to have hallucinations.  Yesterday she thought there was water on the floor, today she thought somebody had cut down the trees in her yard.  She called them tonight around 9 PM stating there was water in the floor and she was not dressed for bed.  They state normally she is getting ready for bed by 9 PM.  That is the only change in her that they have noticed.  They deny any trouble walking.  Although the son-in-law states she uses a walker.  They states she has a pill organizer and that she is taking her medications appropriately.  Patient's main complaint is she got nauseated riding in the ambulance.  PCP Aletha Halim., PA-C   Past Medical History:  Diagnosis Date   Arthritis    Diabetes mellitus without complication ([place])    "borderline"   Diverticulosis of colon    High cholesterol    Hypertension    Hypothyroidism    Pancreatitis     Patient Active Problem List   Diagnosis Date Noted   Acute bronchitis 04/24/2018   CD (contact dermatitis) 04/24/2018   Dermatitis, eczematoid 04/24/2018   Allergic reaction 05/22/2016   Fracture    DM type 2 goal A1C below 7.5 01/16/2015   Hip fracture, left ([place]) 01/15/2015   Varicose veins of leg with complications 62/70/3500   Exocrine pancreatic insufficiency ([place]) 11/03/2013   Physical deconditioning 01/29/2012    Elevated LFTs 01/29/2012   Pancreatitis 01/27/2012   Hyperlipidemia 01/27/2012   Hypokalemia 01/27/2012   Hypertension    Hypothyroidism     Past Surgical History:  Procedure Laterality Date   ABDOMINAL HYSTERECTOMY     partial   APPENDECTOMY     bladder tac     CHOLECYSTECTOMY  1999   cholelithiasis   COLONOSCOPY  01/2007   Dr Olevia Perches: normal   COLONOSCOPY  12/2000   Dr Deatra Ina- diverticulosis   ESOPHAGOGASTRODUODENOSCOPY     Dr Damaris Hippo cannot remember   EUS N/A 03/24/2012   Dr. Paulita Fujita: suspected passed CBD stone as cause of acute pancreatitis, suspective chronic pancreatitis.    EXCISION OF BREAST BIOPSY     HIP PINNING,CANNULATED Left 01/15/2015   Procedure: CANNULATED HIP PINNING/LEFT;  Surgeon: Renette Butters, MD;  Location: [facility];  Service: Orthopedics;  Laterality: Left;   VEIN SURGERY       OB History    Gravida  2   Para  2   Term  2   Preterm      AB      Living  1     SAB      TAB      Ectopic      Multiple      Live Births               Home Medications  Prior to Admission medications   Medication Sig Start Date End Date Taking? Authorizing Provider  amLODipine (NORVASC) 5 MG tablet Take 5 mg by mouth every morning.     [provider]  aspirin EC 81 MG tablet Take 81 mg by mouth at bedtime.     [provider]  Calcium Carbonate (CALTRATE 600 PO) Take 600 tablets by mouth 2 (two) times daily.     [provider]  carboxymethylcellulose (REFRESH PLUS) 0.5 % SOLN 1 drop 3 (three) times daily as needed.    [provider]  COSOPT PF 22.3-6.8 MG/ML SOLN Place 1 drop into both eyes 2 (two) times daily.  10/11/13   [provider]  cyanocobalamin (,VITAMIN B-12,) 1000 MCG/ML injection Inject 1,000 mcg into the muscle every 30 (thirty) days.    [provider]  fluticasone (FLONASE) 50 MCG/ACT nasal spray Place 1 spray into both nostrils daily.    [provider]    hydrochlorothiazide (HYDRODIURIL) 25 MG tablet Take 25 mg by mouth every morning.     [provider]  HYDROcodone-acetaminophen (NORCO/VICODIN) 5-325 MG tablet Take 1 tablet by mouth every 6 (six) hours as needed. 09/27/17   Lily Kocher, PA-C  levocetirizine (XYZAL) 5 MG tablet Take 5 mg by mouth every evening.    [provider]  levothyroxine (SYNTHROID, LEVOTHROID) 88 MCG tablet 88 mcg daily. 10/26/13   [provider]  lisinopril (PRINIVIL,ZESTRIL) 2.5 MG tablet Take 2.5 mg by mouth daily.     [provider]  loperamide (IMODIUM) 2 MG capsule Take 2 mg by mouth as needed for diarrhea or loose stools.    [provider]  metFORMIN (GLUCOPHAGE-XR) 500 MG 24 hr tablet Take 500 mg by mouth daily with breakfast.     [provider]  Multiple Vitamin (MULTIVITAMIN WITH MINERALS) TABS tablet Take 1 tablet by mouth daily.    [provider]  nitrofurantoin, macrocrystal-monohydrate, (MACROBID) 100 MG capsule Take 1 capsule (100 mg total) by mouth 2 (two) times daily. 09/27/17   Lily Kocher, PA-C  Omega-3 Fatty Acids (FISH OIL) 500 MG CAPS Take 2 capsules by mouth daily.     [provider]  potassium chloride SA (K-DUR,KLOR-CON) 20 MEQ tablet Take 20 mEq by mouth 2 (two) times daily.     [provider]  ranitidine (ZANTAC) 150 MG tablet Take 150 mg by mouth 2 (two) times daily.    [provider]  ZENPEP 20000-63000 units CPEP TAKE ONE CAPSULE BY MOUTH THREE TIMES DAILY BEFORE MEALS 06/19/16   Gordy Levan, Eric A, NP  ZIOPTAN 0.0015 % SOLN Place 1 drop into both eyes at bedtime.  10/11/13   [provider]    Family History Family History  Problem Relation Age of Onset   Diabetes Father    Diabetes Mother    Cancer Mother        ovarian   Cancer Sister 78       breast   Cancer Sister 82       bone    Social History Social History   Tobacco Use   Smoking status: Never Smoker    Smokeless tobacco: Never Used  Substance Use Topics   Alcohol use: No    Alcohol/week: 0.0 standard drinks   Drug use: No  lives at home Lives alone Uses a walker   Allergies   Alendronate; Cephalexin; Clindamycin hcl; Clindamycin/lincomycin; Creon [pancreatin]; Doxycycline; Florastor [yeast]; Other; Penicillins; and Zenpep [pancrelipase (lip-prot-amyl)]  Review of Systems Review of Systems  Unable to perform ROS: Mental status change     Physical Exam Updated Vital Signs BP (!) 142/87    Pulse 71    Temp (!) 97.2 F (36.2 C) (Rectal)    Resp 20    Ht 5' (1.524 m)    Wt 65.8 kg    SpO2 97%    BMI 28.32 kg/m   Vital signs normal    Physical Exam Vitals signs and nursing note reviewed.  Constitutional:      Appearance: Normal appearance.  HENT:     Head: Normocephalic and atraumatic.     Right Ear: External ear normal.     Left Ear: External ear normal.     Nose: Nose normal.     Mouth/Throat:     Mouth: Mucous membranes are moist.     Comments: Patient has a small raised slightly purplish nodule on the dorsum of her tongue laterally on the left side.  Patient was unaware of it being there. Eyes:     Extraocular Movements: Extraocular movements intact.     Conjunctiva/sclera: Conjunctivae normal.     Pupils: Pupils are equal, round, and reactive to light.  Neck:     Musculoskeletal: Normal range of motion and neck supple.  Cardiovascular:     Rate and Rhythm: Normal rate and regular rhythm.     Pulses: Normal pulses.     Heart sounds: Normal heart sounds. No murmur.  Pulmonary:     Effort: Pulmonary effort is normal. No respiratory distress.     Breath sounds: Normal breath sounds.  Abdominal:     General: Abdomen is flat. Bowel sounds are normal.     Palpations: Abdomen is soft.     Tenderness: There is no abdominal tenderness.  Musculoskeletal: Normal range of motion.        General: No deformity.  Skin:    General: Skin is warm and dry.     Findings:  No bruising or rash.  Neurological:     General: No focal deficit present.     Mental Status: She is alert and oriented to person, place, and time.     Cranial Nerves: No cranial nerve deficit.     Comments: Patient has no focal weakness, she told me she is at [facility].  Psychiatric:        Attention and Perception: Attention normal.        Mood and Affect: Mood normal.        Speech: Speech normal.        Behavior: Behavior normal.      ED Treatments / Results  Labs (all labs ordered are listed, but only abnormal results are displayed) Results for orders placed or performed during the hospital encounter of 04/24/18  Urinalysis, Routine w reflex microscopic  Result Value Ref Range   Color, Urine YELLOW YELLOW   APPearance CLEAR CLEAR   Specific Gravity, Urine 1.010 1.005 - 1.030   pH 6.0 5.0 - 8.0   Glucose, UA NEGATIVE NEGATIVE mg/dL   Hgb urine dipstick NEGATIVE NEGATIVE   Bilirubin Urine NEGATIVE NEGATIVE   Ketones, ur NEGATIVE NEGATIVE mg/dL   Protein, ur NEGATIVE NEGATIVE mg/dL   Nitrite NEGATIVE NEGATIVE   Leukocytes,Ua NEGATIVE NEGATIVE  Comprehensive metabolic panel  Result Value Ref Range   Sodium 124 (L) 135 - 145 mmol/L   Potassium 3.4 (L) 3.5 - 5.1 mmol/L   Chloride 94 (L) 98 - 111 mmol/L  CO2 20 (L) 22 - 32 mmol/L   Glucose, Bld 131 (H) 70 - 99 mg/dL   BUN 17 8 - 23 mg/dL   Creatinine, Ser 0.85 0.44 - 1.00 mg/dL   Calcium 9.0 8.9 - 10.3 mg/dL   Total Protein 6.9 6.5 - 8.1 g/dL   Albumin 4.1 3.5 - 5.0 g/dL   AST 27 15 - 41 U/L   ALT 18 0 - 44 U/L   Alkaline Phosphatase 106 38 - 126 U/L   Total Bilirubin 0.5 0.3 - 1.2 mg/dL   GFR calc non Af Amer >60 >60 mL/min   GFR calc Af Amer >60 >60 mL/min   Anion gap 10 5 - 15  CBC with Differential  Result Value Ref Range   WBC 9.6 4.0 - 10.5 K/uL   RBC 3.99 3.87 - 5.11 MIL/uL   Hemoglobin 12.2 12.0 - 15.0 g/dL   HCT 35.2 (L) 36.0 - 46.0 %   MCV 88.2 80.0 - 100.0 fL   MCH 30.6 26.0 - 34.0 pg    MCHC 34.7 30.0 - 36.0 g/dL   RDW 12.3 11.5 - 15.5 %   Platelets 254 150 - 400 K/uL   nRBC 0.0 0.0 - 0.2 %   Neutrophils Relative % 73 %   Neutro Abs 7.2 1.7 - 7.7 K/uL   Lymphocytes Relative 12 %   Lymphs Abs 1.1 0.7 - 4.0 K/uL   Monocytes Relative 12 %   Monocytes Absolute 1.1 (H) 0.1 - 1.0 K/uL   Eosinophils Relative 1 %   Eosinophils Absolute 0.1 0.0 - 0.5 K/uL   Basophils Relative 1 %   Basophils Absolute 0.1 0.0 - 0.1 K/uL   Immature Granulocytes 1 %   Abs Immature Granulocytes 0.06 0.00 - 0.07 K/uL  Lactic acid, plasma  Result Value Ref Range   Lactic Acid, Venous 1.1 0.5 - 1.9 mmol/L  Troponin I - Once  Result Value Ref Range   Troponin I <0.03 <0.03 ng/mL   Laboratory interpretation all normal except new hypokalemia, hyponatremia, low chloride, without metabolic acidosis (normal anion gap and lactic acid)    EKG EKG Interpretation  Date/Time:  Saturday April 24 2018 23:25:29 EDT Ventricular Rate:  72 PR Interval:    QRS Duration: 89 QT Interval:  354 QTC Calculation: 388 R Axis:   52 Text Interpretation:  Normal sinus rhythm Ventricular premature complex Borderline repolarization abnormality No significant change since last tracing 15 Jan 2015 Confirmed by Rolland Porter [phone]) on 04/24/2018 11:32:14 PM   Radiology Ct Head Wo Contrast  Result Date: 04/25/2018 CLINICAL DATA:  Altered LOC EXAM: CT HEAD WITHOUT CONTRAST TECHNIQUE: Contiguous axial images were obtained from the base of the skull through the vertex without intravenous contrast. COMPARISON:  CT brain 10/14/2007 FINDINGS: Brain: No acute territorial infarction, hemorrhage or intracranial mass. Chronic appearing small infarct in the left cerebellum. Mild atrophy. Mild small vessel ischemic changes of the white matter. The ventricles are nonenlarged. Vascular: No hyperdense vessels.  Carotid vascular calcification Skull: Fluid in the left mastoid air cells.  No skull fracture Sinuses/Orbits: Mucosal thickening  and fluid in the left maxillary sinus. Other: None IMPRESSION: 1. No CT evidence for acute intracranial abnormality. 2. Atrophy and small vessel ischemic changes of the white matter. 3. Fluid in the left mastoid air cells. Electronically Signed   By: Donavan Foil M.D.   On: 04/25/2018 00:40    Procedures Procedures (including critical care time)  Medications Ordered in ED Medications  ondansetron (ZOFRAN)  injection 4 mg (4 mg Intravenous Given 04/24/18 2358)     Initial Impression / Assessment and Plan / ED Course  I have reviewed the triage vital signs and the nursing notes.  Pertinent labs & imaging results that were available during my care of the patient were reviewed by me and considered in my medical decision making (see chart for details).      Laboratory testing was ordered and CT head was ordered due to some change in mental status per family.  Rectal temperature was done and is normal.  After reviewing her laboratory test results went back and patient and family deny any recent vomiting or diarrhea.  They deny any change in medications other than adding the Bactrim.  I added a urine sodium to her lab tests.  At this point I am not sure  why she has the hyponatremia.  Patient was given 500 cc normal saline bolus for hyponatremia.  1:42 AM Dr. Shanon Brow, hospitalist will admit  Final Clinical Impressions(s) / ED Diagnoses   Final diagnoses:  Hallucination, visual  Hyponatremia    Plan admission  Rolland Porter, MD, Barbette Or, MD 04/25/18 [phone]

## 2018-04-24 NOTE — ED Triage Notes (Addendum)
Pt arrived to er by ems after being called by family due to pt having altered mental status, upon arrival to er pt able to answer questions, denies any pain, does complain of nausea that started during transport. Pt recently diagnosed with uti and started on bactrim 04/21/2018, family reported to ems that pt was confused and seeing things, blood sugar with ems was 160

## 2018-04-25 DIAGNOSIS — R443 Hallucinations, unspecified: Secondary | ICD-10-CM | POA: Diagnosis present

## 2018-04-25 DIAGNOSIS — E871 Hypo-osmolality and hyponatremia: Secondary | ICD-10-CM | POA: Diagnosis present

## 2018-04-25 LAB — COMPREHENSIVE METABOLIC PANEL
ALK PHOS: 106 U/L (ref 38–126)
ALT: 18 U/L (ref 0–44)
ANION GAP: 10 (ref 5–15)
AST: 27 U/L (ref 15–41)
Albumin: 4.1 g/dL (ref 3.5–5.0)
BILIRUBIN TOTAL: 0.5 mg/dL (ref 0.3–1.2)
BUN: 17 mg/dL (ref 8–23)
CALCIUM: 9 mg/dL (ref 8.9–10.3)
CO2: 20 mmol/L — AB (ref 22–32)
CREATININE: 0.85 mg/dL (ref 0.44–1.00)
Chloride: 94 mmol/L — ABNORMAL LOW (ref 98–111)
Glucose, Bld: 131 mg/dL — ABNORMAL HIGH (ref 70–99)
Potassium: 3.4 mmol/L — ABNORMAL LOW (ref 3.5–5.1)
Sodium: 124 mmol/L — ABNORMAL LOW (ref 135–145)
TOTAL PROTEIN: 6.9 g/dL (ref 6.5–8.1)

## 2018-04-25 LAB — CBC WITH DIFFERENTIAL/PLATELET
Abs Immature Granulocytes: 0.06 10*3/uL (ref 0.00–0.07)
BASOS ABS: 0.1 10*3/uL (ref 0.0–0.1)
BASOS PCT: 1 %
EOS ABS: 0.1 10*3/uL (ref 0.0–0.5)
EOS PCT: 1 %
HEMATOCRIT: 35.2 % — AB (ref 36.0–46.0)
Hemoglobin: 12.2 g/dL (ref 12.0–15.0)
IMMATURE GRANULOCYTES: 1 %
LYMPHS ABS: 1.1 10*3/uL (ref 0.7–4.0)
Lymphocytes Relative: 12 %
MCH: 30.6 pg (ref 26.0–34.0)
MCHC: 34.7 g/dL (ref 30.0–36.0)
MCV: 88.2 fL (ref 80.0–100.0)
Monocytes Absolute: 1.1 10*3/uL — ABNORMAL HIGH (ref 0.1–1.0)
Monocytes Relative: 12 %
NEUTROS PCT: 73 %
NRBC: 0 % (ref 0.0–0.2)
Neutro Abs: 7.2 10*3/uL (ref 1.7–7.7)
PLATELETS: 254 10*3/uL (ref 150–400)
RBC: 3.99 MIL/uL (ref 3.87–5.11)
RDW: 12.3 % (ref 11.5–15.5)
WBC: 9.6 10*3/uL (ref 4.0–10.5)

## 2018-04-25 LAB — BASIC METABOLIC PANEL
Anion gap: 8 (ref 5–15)
BUN: 12 mg/dL (ref 8–23)
CO2: 22 mmol/L (ref 22–32)
Calcium: 8.4 mg/dL — ABNORMAL LOW (ref 8.9–10.3)
Chloride: 98 mmol/L (ref 98–111)
Creatinine, Ser: 0.63 mg/dL (ref 0.44–1.00)
GFR calc Af Amer: >60 mL/min (ref >60)
GFR calc non Af Amer: >60 mL/min (ref >60)
Glucose, Bld: 107 mg/dL — ABNORMAL HIGH (ref 70–99)
Potassium: 3.6 mmol/L (ref 3.5–5.1)
Sodium: 128 mmol/L — ABNORMAL LOW (ref 135–145)

## 2018-04-25 LAB — CBC
HCT: 32.6 % — ABNORMAL LOW (ref 36.0–46.0)
Hemoglobin: 11.1 g/dL — ABNORMAL LOW (ref 12.0–15.0)
MCH: 30.5 pg (ref 26.0–34.0)
MCHC: 34 g/dL (ref 30.0–36.0)
MCV: 89.6 fL (ref 80.0–100.0)
Platelets: 205 10*3/uL (ref 150–400)
RBC: 3.64 MIL/uL — ABNORMAL LOW (ref 3.87–5.11)
RDW: 12.4 % (ref 11.5–15.5)
WBC: 5.5 10*3/uL (ref 4.0–10.5)
nRBC: 0 % (ref 0.0–0.2)

## 2018-04-25 LAB — GLUCOSE, CAPILLARY: GLUCOSE-CAPILLARY: 118 mg/dL — AB (ref 70–99)

## 2018-04-25 LAB — TSH: TSH: 4.595 u[IU]/mL — ABNORMAL HIGH (ref 0.350–4.500)

## 2018-04-25 LAB — LACTIC ACID, PLASMA: LACTIC ACID, VENOUS: 1.1 mmol/L (ref 0.5–1.9)

## 2018-04-25 LAB — TROPONIN I: Troponin I: <0.03 ng/mL (ref <0.03)

## 2018-04-25 MED ORDER — POTASSIUM CHLORIDE CRYS ER 20 MEQ PO TBCR
20.0000 meq | EXTENDED_RELEASE_TABLET | Freq: Two times a day (BID) | ORAL | Status: DC
  Administered 2018-04-25 – 2018-04-27 (×5): 20 meq via ORAL
  Filled 2018-04-25 (×5): qty 1

## 2018-04-25 MED ORDER — LISINOPRIL 5 MG PO TABS
2.5000 mg | ORAL_TABLET | Freq: Every day | ORAL | Status: DC
  Administered 2018-04-25 – 2018-04-27 (×3): 2.5 mg via ORAL
  Filled 2018-04-25 (×3): qty 1

## 2018-04-25 MED ORDER — CALCIUM CARBONATE 1250 (500 CA) MG PO TABS
500.0000 mg | ORAL_TABLET | Freq: Every day | ORAL | Status: DC
  Administered 2018-04-25 – 2018-04-27 (×3): 500 mg via ORAL
  Filled 2018-04-25 (×3): qty 1

## 2018-04-25 MED ORDER — DORZOLAMIDE HCL-TIMOLOL MAL 2-0.5 % OP SOLN
2.0000 [drp] | Freq: Two times a day (BID) | OPHTHALMIC | Status: DC
  Administered 2018-04-25 – 2018-04-27 (×5): 2 [drp] via OPHTHALMIC
  Filled 2018-04-25 (×2): qty 10

## 2018-04-25 MED ORDER — FAMOTIDINE 20 MG PO TABS
20.0000 mg | ORAL_TABLET | Freq: Every day | ORAL | Status: DC
  Administered 2018-04-25 – 2018-04-27 (×3): 20 mg via ORAL
  Filled 2018-04-25 (×3): qty 1

## 2018-04-25 MED ORDER — OMEGA-3-ACID ETHYL ESTERS 1 G PO CAPS
1.0000 g | ORAL_CAPSULE | Freq: Every day | ORAL | Status: DC
  Administered 2018-04-25 – 2018-04-27 (×3): 1 g via ORAL
  Filled 2018-04-25 (×3): qty 1

## 2018-04-25 MED ORDER — CARBOXYMETHYLCELLULOSE SODIUM 0.5 % OP SOLN: 1.0000 [drp] | Freq: Three times a day (TID) | OPHTHALMIC | Status: DC | PRN

## 2018-04-25 MED ORDER — ADULT MULTIVITAMIN W/MINERALS CH
1.0000 | ORAL_TABLET | Freq: Every day | ORAL | Status: DC
  Administered 2018-04-25 – 2018-04-27 (×3): 1 via ORAL
  Filled 2018-04-25 (×3): qty 1

## 2018-04-25 MED ORDER — POLYVINYL ALCOHOL 1.4 % OP SOLN: 1.0000 [drp] | Freq: Three times a day (TID) | OPHTHALMIC | Status: DC | PRN

## 2018-04-25 MED ORDER — LEVOCETIRIZINE DIHYDROCHLORIDE 5 MG PO TABS: 5.0000 mg | ORAL_TABLET | Freq: Every evening | ORAL | Status: DC

## 2018-04-25 MED ORDER — LATANOPROST 0.005 % OP SOLN
1.0000 [drp] | Freq: Every day | OPHTHALMIC | Status: DC
  Filled 2018-04-25: qty 2.5

## 2018-04-25 MED ORDER — PANCRELIPASE (LIP-PROT-AMYL) 20000-63000 UNITS PO CPEP: 1.0000 | ORAL_CAPSULE | Freq: Every day | ORAL | Status: DC

## 2018-04-25 MED ORDER — AMLODIPINE BESYLATE 5 MG PO TABS
5.0000 mg | ORAL_TABLET | Freq: Every morning | ORAL | Status: DC
  Administered 2018-04-25 – 2018-04-27 (×3): 5 mg via ORAL
  Filled 2018-04-25 (×3): qty 1

## 2018-04-25 MED ORDER — LORATADINE 10 MG PO TABS
10.0000 mg | ORAL_TABLET | Freq: Every day | ORAL | Status: DC
  Administered 2018-04-25 – 2018-04-27 (×3): 10 mg via ORAL
  Filled 2018-04-25 (×3): qty 1

## 2018-04-25 MED ORDER — SODIUM CHLORIDE 0.9 % IV BOLUS
500.0000 mL | Freq: Once | INTRAVENOUS | Status: AC
  Administered 2018-04-25: 500 mL via INTRAVENOUS

## 2018-04-25 MED ORDER — FLUTICASONE PROPIONATE 50 MCG/ACT NA SUSP
1.0000 | Freq: Every day | NASAL | Status: DC
  Administered 2018-04-25 – 2018-04-27 (×3): 1 via NASAL
  Filled 2018-04-25 (×2): qty 16

## 2018-04-25 MED ORDER — LEVOTHYROXINE SODIUM 100 MCG PO TABS
100.0000 ug | ORAL_TABLET | Freq: Every day | ORAL | Status: DC
  Administered 2018-04-25 – 2018-04-27 (×3): 100 ug via ORAL
  Filled 2018-04-25 (×3): qty 1

## 2018-04-25 MED ORDER — ASPIRIN EC 81 MG PO TBEC
81.0000 mg | DELAYED_RELEASE_TABLET | Freq: Every day | ORAL | Status: DC
  Administered 2018-04-25 – 2018-04-26 (×2): 81 mg via ORAL
  Filled 2018-04-25 (×2): qty 1

## 2018-04-25 MED ORDER — SODIUM CHLORIDE 0.9 % IV SOLN
INTRAVENOUS | Status: DC
  Administered 2018-04-25 – 2018-04-26 (×2): via INTRAVENOUS

## 2018-04-25 NOTE — Progress Notes (Signed)
PROGRESS NOTE                                                                                                                                                                                                             Patient Demographics:    Kathleen [name], is a 85 y.o. female, DOB - [birth year], BLT:[id number]  Admit date - 04/24/2018   Admitting Physician Phillips Grout, MD  Outpatient Primary MD for the patient is Aletha Halim., PA-C  LOS - 0  Outpatient Specialists: None  Chief Complaint  Patient presents with  . Urinary Tract Infection       Brief Narrative 85 year old female with hypertension, hypothyroidism, history of pancreatitis who was recently treated for UTI with Bactrim, lives home alone was noted by her family to be hallucinating.  No fevers, nausea, vomiting or diarrhea.  2 days back she was hallucinating saying there were bugs in her bathroom when she was seen by her PCP and diagnosed with UTI and started on Bactrim.  However she continued to have hallucinations, called her family member saying there was water on the floor or somebody had cut down the trees in the yard.  At baseline uses a walker and has a pill organizer with which she takes her medication as instructed. Work-up in the ED showed she was hyponatremic with sodium of 124.  Placed in observation for further management.   Subjective:   Patient seems hard of hearing and difficult to communicate properly.  She knows where she lives and able to answer a few questions.   Assessment  & Plan :    Principal Problem:   Hyponatremia Suspect this is prerenal with dehydration and concomitant use of HCTZ.  HCTZ discontinued.  Continue IV hydration.  Urine lites still pending. Sodium level improving in a.m. lab (128).  Active Problems: Acute metabolic encephalopathy with hallucinations Possibly due to recent UTI and hyponatremia.  Repeat UA done in  the ED was normal.  Head CT unremarkable except for atrophy and small vessel ischemic changes. Mental status appears to be clearing.  Avoid benzos or narcotics. Synthroid dose adjusted for elevated TSH.  Hypothyroidism TSH mildly elevated.  Synthroid was increased to 100 mcg.   Essential hypertension Continue amlodipine and ACE inhibitor.  Discontinue HCTZ.     Code Status : Full code  Family  Communication  : None at bedside  Disposition Plan  : Pending further improvement in her sodium and mental status.  PT evaluation.  Possible discharge home tomorrow  Barriers For Discharge : Improving symptoms  Consults  : None Procedures  : CT head  DVT Prophylaxis  :  Lovenox -  Lab Results  Component Value Date   PLT 205 04/25/2018    Antibiotics  :   Anti-infectives (From admission, onward)   None        Objective:   Vitals:   04/24/18 2327 04/25/18 0145 04/25/18 0200 04/25/18 0546  BP:   127/70 (!) 118/52  Pulse:  69 73 66  Resp:  19 (!) 22 19  Temp: (!) 97.2 F (36.2 C)   98.3 F (36.8 C)  TempSrc: Rectal   Oral  SpO2:  92% 97% 95%  Weight:    65.8 kg  Height:    5' (1.524 m)    Wt Readings from Last 3 Encounters:  04/25/18 65.8 kg  09/27/17 68 kg  01/15/15 63.5 kg     Intake/Output Summary (Last 24 hours) at 04/25/2018 1038 Last data filed at 04/25/2018 0900 Gross per 24 hour  Intake 200 ml  Output 650 ml  Net -450 ml     Physical Exam  Gen: not in distress, hard of hearing HEENT: no pallor, moist mucosa, supple neck Chest: clear b/l, no added sounds CVS: N S1&S2, no murmurs, GI: soft, NT, ND, BS+ Musculoskeletal: warm, no edema CNS: Alert and awake, oriented x1-2,    Data Review:    CBC Recent Labs  Lab 04/25/18 0002 04/25/18 0639  WBC 9.6 5.5  HGB 12.2 11.1*  HCT 35.2* 32.6*  PLT 254 205  MCV 88.2 89.6  MCH 30.6 30.5  MCHC 34.7 34.0  RDW 12.3 12.4  LYMPHSABS 1.1  --   MONOABS 1.1*  --   EOSABS 0.1  --   BASOSABS 0.1  --      Chemistries  Recent Labs  Lab 04/25/18 0002 04/25/18 0639  NA 124* 128*  K 3.4* 3.6  CL 94* 98  CO2 20* 22  GLUCOSE 131* 107*  BUN 17 12  CREATININE 0.85 0.63  CALCIUM 9.0 8.4*  AST 27  --   ALT 18  --   ALKPHOS 106  --   BILITOT 0.5  --    ------------------------------------------------------------------------------------------------------------------ No results for input(s): CHOL, HDL, LDLCALC, TRIG, CHOLHDL, LDLDIRECT in the last 72 hours.  Lab Results  Component Value Date   HGBA1C 6.3 (H) 01/27/2012   ------------------------------------------------------------------------------------------------------------------ Recent Labs    04/25/18 0002  TSH 4.595*   ------------------------------------------------------------------------------------------------------------------ No results for input(s): VITAMINB12, FOLATE, FERRITIN, TIBC, IRON, RETICCTPCT in the last 72 hours.  Coagulation profile No results for input(s): INR, PROTIME in the last 168 hours.  No results for input(s): DDIMER in the last 72 hours.  Cardiac Enzymes Recent Labs  Lab 04/25/18 0002  TROPONINI <0.03   ------------------------------------------------------------------------------------------------------------------ No results found for: BNP  Inpatient Medications  Scheduled Meds: . amLODipine  5 mg Oral q morning - 10a  . aspirin EC  81 mg Oral QHS  . calcium carbonate  500 mg of elemental calcium Oral Q breakfast  . dorzolamide-timolol  2 drop Both Eyes BID  . famotidine  20 mg Oral Daily  . fluticasone  1 spray Each Nare Daily  . levothyroxine  100 mcg Oral Q0600  . lisinopril  2.5 mg Oral Daily  . loratadine  10 mg Oral  Daily  . multivitamin with minerals  1 tablet Oral Daily  . omega-3 acid ethyl esters  1 g Oral Daily  . potassium chloride SA  20 mEq Oral BID  . Tafluprost (PF)  1 drop Both Eyes QHS   Continuous Infusions: . sodium chloride     PRN Meds:.polyvinyl  alcohol  Micro Results No results found for this or any previous visit (from the past 240 hour(s)).  Radiology Reports Ct Head Wo Contrast  Result Date: 04/25/2018 CLINICAL DATA:  Altered LOC EXAM: CT HEAD WITHOUT CONTRAST TECHNIQUE: Contiguous axial images were obtained from the base of the skull through the vertex without intravenous contrast. COMPARISON:  CT brain 10/14/2007 FINDINGS: Brain: No acute territorial infarction, hemorrhage or intracranial mass. Chronic appearing small infarct in the left cerebellum. Mild atrophy. Mild small vessel ischemic changes of the white matter. The ventricles are nonenlarged. Vascular: No hyperdense vessels.  Carotid vascular calcification Skull: Fluid in the left mastoid air cells.  No skull fracture Sinuses/Orbits: Mucosal thickening and fluid in the left maxillary sinus. Other: None IMPRESSION: 1. No CT evidence for acute intracranial abnormality. 2. Atrophy and small vessel ischemic changes of the white matter. 3. Fluid in the left mastoid air cells. Electronically Signed   By: Donavan Foil M.D.   On: 04/25/2018 00:40    Time Spent in minutes  25   Alvena Kiernan M.D on 04/25/2018 at 10:38 AM  Between 7am to 7pm - Pager - [phone]  After 7pm go to [email or web address] - password [facility]  [facility] -  Office  [phone]

## 2018-04-25 NOTE — H&P (Signed)
History and Physical    Kathleen [name] BSJ:[id number] DOB: February 10, 1932 DOA: 04/24/2018  PCP: Aletha Halim., PA-C  Patient coming from:  home  Chief Complaint:  hallucinating  HPI: Kathleen [name] is [name] 85 y.o. female with medical history significant of recent uti treated with bactrim, htn lives at home alone has been noted by family to be hallucinating.  Pt denies any now.  No fevers at home.  No n/v/d.  No family present at this time.  Found to have low na and referred for admission for hyponatremia.  No report of memory issues chronically.  Review of Systems: As per HPI otherwise 10 point review of systems negative.   Past Medical History:  Diagnosis Date  . Arthritis   . Diabetes mellitus without complication ([place])    "borderline"  . Diverticulosis of colon   . High cholesterol   . Hypertension   . Hypothyroidism   . Pancreatitis     Past Surgical History:  Procedure Laterality Date  . ABDOMINAL HYSTERECTOMY     partial  . APPENDECTOMY    . bladder tac    . CHOLECYSTECTOMY  1999   cholelithiasis  . COLONOSCOPY  01/2007   Dr Olevia Perches: normal  . COLONOSCOPY  12/2000   Dr Deatra Ina- diverticulosis  . ESOPHAGOGASTRODUODENOSCOPY     Dr Damaris Hippo cannot remember  . EUS N/[name] 03/24/2012   Dr. Paulita Fujita: suspected passed CBD stone as cause of acute pancreatitis, suspective chronic pancreatitis.   Marland Kitchen EXCISION OF BREAST BIOPSY    . HIP PINNING,CANNULATED Left 01/15/2015   Procedure: CANNULATED HIP PINNING/LEFT;  Surgeon: Renette Butters, MD;  Location: [facility];  Service: Orthopedics;  Laterality: Left;  Marland Kitchen VEIN SURGERY       reports that she has never smoked. She has never used smokeless tobacco. She reports that she does not drink alcohol or use drugs.  Allergies  Allergen Reactions  . Alendronate     Unknown reaction  . Cephalexin     Unknown reaction  . Clindamycin Hcl Other (See Comments)  . Clindamycin/Lincomycin Hives  . Creon [Pancreatin] Hives  . Doxycycline     Unknown  reaction  . Florastor [Yeast] Hives  . Other Other (See Comments) and Hives  . Penicillins     Has patient had [name] PCN reaction causing immediate rash, facial/tongue/throat swelling, SOB or lightheadedness with hypotension: unknown Has patient had [name] PCN reaction causing severe rash involving mucus membranes or skin necrosis: unknown Has patient had [name] PCN reaction that required hospitalization unknown Has patient had [name] PCN reaction occurring within the last 10 years:unknown If all of the above answers are "NO", then may proceed with Cephalosporin use.   Marland Kitchen Zenpep [Pancrelipase (Lip-Prot-Amyl)] Hives    Family History  Problem Relation Age of Onset  . Diabetes Father   . Diabetes Mother   . Cancer Mother        ovarian  . Cancer Sister 78       breast  . Cancer Sister 28       bone    Prior to Admission medications   Medication Sig Start Date End Date Taking? Authorizing Provider  sulfamethoxazole-trimethoprim (BACTRIM DS,SEPTRA DS) 800-160 MG tablet Take 2 tablets by mouth 2 (two) times daily with [name] meal. 04/21/18 05/01/18 Yes [provider]  amLODipine (NORVASC) 5 MG tablet Take 5 mg by mouth every morning.     [provider]  aspirin EC 81 MG tablet Take 81 mg by mouth  at bedtime.     [provider]  Calcium Carbonate (CALTRATE 600 PO) Take 600 tablets by mouth 2 (two) times daily.     [provider]  carboxymethylcellulose (REFRESH PLUS) 0.5 % SOLN 1 drop 3 (three) times daily as needed.    [provider]  COSOPT PF 22.3-6.8 MG/ML SOLN Place 1 drop into both eyes 2 (two) times daily.  10/11/13   [provider]  cyanocobalamin (,VITAMIN B-12,) 1000 MCG/ML injection Inject 1,000 mcg into the muscle every 30 (thirty) days.    [provider]  fluticasone (FLONASE) 50 MCG/ACT nasal spray Place 1 spray into both nostrils daily.    [provider]  hydrochlorothiazide (HYDRODIURIL) 25 MG tablet Take 25 mg by mouth  every morning.     [provider]  HYDROcodone-acetaminophen (NORCO/VICODIN) 5-325 MG tablet Take 1 tablet by mouth every 6 (six) hours as needed. 09/27/17   Lily Kocher, PA-C  levocetirizine (XYZAL) 5 MG tablet Take 5 mg by mouth every evening.    [provider]  levothyroxine (SYNTHROID, LEVOTHROID) 88 MCG tablet 88 mcg daily. 10/26/13   [provider]  lisinopril (PRINIVIL,ZESTRIL) 2.5 MG tablet Take 2.5 mg by mouth daily.     [provider]  loperamide (IMODIUM) 2 MG capsule Take 2 mg by mouth as needed for diarrhea or loose stools.    [provider]  metFORMIN (GLUCOPHAGE-XR) 500 MG 24 hr tablet Take 500 mg by mouth daily with breakfast.     [provider]  Multiple Vitamin (MULTIVITAMIN WITH MINERALS) TABS tablet Take 1 tablet by mouth daily.    [provider]  nitrofurantoin, macrocrystal-monohydrate, (MACROBID) 100 MG capsule Take 1 capsule (100 mg total) by mouth 2 (two) times daily. 09/27/17   Lily Kocher, PA-C  Omega-3 Fatty Acids (FISH OIL) 500 MG CAPS Take 2 capsules by mouth daily.     [provider]  potassium chloride SA (K-DUR,KLOR-CON) 20 MEQ tablet Take 20 mEq by mouth 2 (two) times daily.     [provider]  ranitidine (ZANTAC) 150 MG tablet Take 150 mg by mouth 2 (two) times daily.    [provider]  ZENPEP 20000-63000 units CPEP TAKE ONE CAPSULE BY MOUTH THREE TIMES DAILY BEFORE MEALS 06/19/16   Gordy Levan, Eric A, NP  ZIOPTAN 0.0015 % SOLN Place 1 drop into both eyes at bedtime.  10/11/13   [provider]    Physical Exam: Vitals:   04/24/18 2300 04/24/18 2327 04/25/18 0145 04/25/18 0200  BP: (!) 149/79   127/70  Pulse: 77  69 73  Resp:   19 (!) 22  Temp:  (!) 97.2 F (36.2 C)    TempSrc:  Rectal    SpO2: 97%  92% 97%  Weight:      Height:          Constitutional: NAD, calm, comfortable Vitals:   04/24/18 2300 04/24/18 2327 04/25/18 0145 04/25/18 0200  BP:  (!) 149/79   127/70  Pulse: 77  69 73  Resp:   19 (!) 22  Temp:  (!) 97.2 F (36.2 C)    TempSrc:  Rectal    SpO2: 97%  92% 97%  Weight:      Height:       Eyes: PERRL, lids and conjunctivae normal ENMT: Mucous membranes are moist. Posterior pharynx clear of any exudate or lesions.Normal dentition.  Neck: normal, supple, no masses, no thyromegaly Respiratory: clear to auscultation bilaterally, no wheezing, no  crackles. Normal respiratory effort. No accessory muscle use.  Cardiovascular: Regular rate and rhythm, no murmurs / rubs / gallops. No extremity edema. 2+ pedal pulses. No carotid bruits.  Abdomen: no tenderness, no masses palpated. No hepatosplenomegaly. Bowel sounds positive.  Musculoskeletal: no clubbing / cyanosis. No joint deformity upper and lower extremities. Good ROM, no contractures. Normal muscle tone.  Skin: no rashes, lesions, ulcers. No induration Neurologic: CN 2-12 grossly intact. Sensation intact, DTR normal. Strength 5/5 in all 4.  Psychiatric: Normal judgment and insight. Alert and oriented x 3. Normal mood.    Labs on Admission: I have personally reviewed following labs and imaging studies  CBC: Recent Labs  Lab 04/25/18 0002  WBC 9.6  NEUTROABS 7.2  HGB 12.2  HCT 35.2*  MCV 88.2  PLT 497   Basic Metabolic Panel: Recent Labs  Lab 04/25/18 0002  NA 124*  K 3.4*  CL 94*  CO2 20*  GLUCOSE 131*  BUN 17  CREATININE 0.85  CALCIUM 9.0   GFR: Estimated Creatinine Clearance: 41.7 mL/min (by C-G formula based on SCr of 0.85 mg/dL). Liver Function Tests: Recent Labs  Lab 04/25/18 0002  AST 27  ALT 18  ALKPHOS 106  BILITOT 0.5  PROT 6.9  ALBUMIN 4.1   No results for input(s): LIPASE, AMYLASE in the last 168 hours. No results for input(s): AMMONIA in the last 168 hours. Coagulation Profile: No results for input(s): INR, PROTIME in the last 168 hours. Cardiac Enzymes: Recent Labs  Lab 04/25/18 0002  TROPONINI <0.03   BNP (last 3  results) No results for input(s): PROBNP in the last 8760 hours. HbA1C: No results for input(s): HGBA1C in the last 72 hours. CBG: No results for input(s): GLUCAP in the last 168 hours. Lipid Profile: No results for input(s): CHOL, HDL, LDLCALC, TRIG, CHOLHDL, LDLDIRECT in the last 72 hours. Thyroid Function Tests: No results for input(s): TSH, T4TOTAL, FREET4, T3FREE, THYROIDAB in the last 72 hours. Anemia Panel: No results for input(s): VITAMINB12, FOLATE, FERRITIN, TIBC, IRON, RETICCTPCT in the last 72 hours. Urine analysis:    Component Value Date/Time   COLORURINE YELLOW 04/24/2018 2324   APPEARANCEUR CLEAR 04/24/2018 2324   LABSPEC 1.010 04/24/2018 2324   PHURINE 6.0 04/24/2018 2324   GLUCOSEU NEGATIVE 04/24/2018 2324   HGBUR NEGATIVE 04/24/2018 2324   BILIRUBINUR NEGATIVE 04/24/2018 2324   KETONESUR NEGATIVE 04/24/2018 2324   PROTEINUR NEGATIVE 04/24/2018 2324   UROBILINOGEN 0.2 03/29/2013 1332   NITRITE NEGATIVE 04/24/2018 2324   LEUKOCYTESUR NEGATIVE 04/24/2018 2324   Sepsis Labs: !!!!!!!!!!!!!!!!!!!!!!!!!!!!!!!!!!!!!!!!!!!! @LABRCNTIP (procalcitonin:4,lacticidven:4) )No results found for this or any previous visit (from the past 240 hour(s)).   Radiological Exams on Admission: Ct Head Wo Contrast  Result Date: 04/25/2018 CLINICAL DATA:  Altered LOC EXAM: CT HEAD WITHOUT CONTRAST TECHNIQUE: Contiguous axial images were obtained from the base of the skull through the vertex without intravenous contrast. COMPARISON:  CT brain 10/14/2007 FINDINGS: Brain: No acute territorial infarction, hemorrhage or intracranial mass. Chronic appearing small infarct in the left cerebellum. Mild atrophy. Mild small vessel ischemic changes of the white matter. The ventricles are nonenlarged. Vascular: No hyperdense vessels.  Carotid vascular calcification Skull: Fluid in the left mastoid air cells.  No skull fracture Sinuses/Orbits: Mucosal thickening and fluid in the left maxillary sinus.  Other: None IMPRESSION: 1. No CT evidence for acute intracranial abnormality. 2. Atrophy and small vessel ischemic changes of the white matter. 3. Fluid in the left mastoid air cells. Electronically Signed   By: Maudie Mercury  Francoise Ceo M.D.   On: 04/25/2018 00:40   Old chart reviewed Case discussed with edp dr Daleen Bo knapp  Assessment/Plan 85 yo female with recent uti treated with bactrim report of hallucinating found to have hyponatremia  Principal Problem:   Hyponatremia- ns overnight.  Urine studies pending.  Mental status nml at this time  Active Problems:   Hallucinations- none at this time   Hypertension- clarify and resume home meds   Hyperlipidemia- clarify home meds  uti- resolved   DVT prophylaxis: scds Code Status:  full Family Communication: none Disposition Plan: 1-2 days Consults called:  none Admission status:  observation    Kathleen Erbe A MD [facility]  If 7PM-7AM, please contact night-coverage [email or web address] Password [facility]  04/25/2018, 3:26 AM

## 2018-04-26 DIAGNOSIS — K861 Other chronic pancreatitis: Secondary | ICD-10-CM | POA: Diagnosis present

## 2018-04-26 DIAGNOSIS — K5730 Diverticulosis of large intestine without perforation or abscess without bleeding: Secondary | ICD-10-CM | POA: Diagnosis present

## 2018-04-26 DIAGNOSIS — G9341 Metabolic encephalopathy: Secondary | ICD-10-CM | POA: Diagnosis present

## 2018-04-26 DIAGNOSIS — E7800 Pure hypercholesterolemia, unspecified: Secondary | ICD-10-CM | POA: Diagnosis present

## 2018-04-26 DIAGNOSIS — L309 Dermatitis, unspecified: Secondary | ICD-10-CM | POA: Diagnosis present

## 2018-04-26 DIAGNOSIS — N390 Urinary tract infection, site not specified: Secondary | ICD-10-CM | POA: Diagnosis present

## 2018-04-26 DIAGNOSIS — R443 Hallucinations, unspecified: Secondary | ICD-10-CM | POA: Diagnosis not present

## 2018-04-26 DIAGNOSIS — J209 Acute bronchitis, unspecified: Secondary | ICD-10-CM | POA: Diagnosis present

## 2018-04-26 DIAGNOSIS — Z880 Allergy status to penicillin: Secondary | ICD-10-CM | POA: Diagnosis not present

## 2018-04-26 DIAGNOSIS — E039 Hypothyroidism, unspecified: Secondary | ICD-10-CM | POA: Diagnosis present

## 2018-04-26 DIAGNOSIS — Z808 Family history of malignant neoplasm of other organs or systems: Secondary | ICD-10-CM | POA: Diagnosis not present

## 2018-04-26 DIAGNOSIS — Z79890 Hormone replacement therapy (postmenopausal): Secondary | ICD-10-CM | POA: Diagnosis not present

## 2018-04-26 DIAGNOSIS — Z888 Allergy status to other drugs, medicaments and biological substances status: Secondary | ICD-10-CM | POA: Diagnosis not present

## 2018-04-26 DIAGNOSIS — Z8041 Family history of malignant neoplasm of ovary: Secondary | ICD-10-CM | POA: Diagnosis not present

## 2018-04-26 DIAGNOSIS — Z881 Allergy status to other antibiotic agents status: Secondary | ICD-10-CM | POA: Diagnosis not present

## 2018-04-26 DIAGNOSIS — I10 Essential (primary) hypertension: Secondary | ICD-10-CM

## 2018-04-26 DIAGNOSIS — Z7984 Long term (current) use of oral hypoglycemic drugs: Secondary | ICD-10-CM | POA: Diagnosis not present

## 2018-04-26 DIAGNOSIS — E119 Type 2 diabetes mellitus without complications: Secondary | ICD-10-CM | POA: Diagnosis present

## 2018-04-26 DIAGNOSIS — E785 Hyperlipidemia, unspecified: Secondary | ICD-10-CM | POA: Diagnosis present

## 2018-04-26 DIAGNOSIS — Z7982 Long term (current) use of aspirin: Secondary | ICD-10-CM | POA: Diagnosis not present

## 2018-04-26 DIAGNOSIS — E871 Hypo-osmolality and hyponatremia: Secondary | ICD-10-CM | POA: Diagnosis present

## 2018-04-26 DIAGNOSIS — M1990 Unspecified osteoarthritis, unspecified site: Secondary | ICD-10-CM | POA: Diagnosis present

## 2018-04-26 DIAGNOSIS — Z833 Family history of diabetes mellitus: Secondary | ICD-10-CM | POA: Diagnosis not present

## 2018-04-26 DIAGNOSIS — Z803 Family history of malignant neoplasm of breast: Secondary | ICD-10-CM | POA: Diagnosis not present

## 2018-04-26 DIAGNOSIS — Z7951 Long term (current) use of inhaled steroids: Secondary | ICD-10-CM | POA: Diagnosis not present

## 2018-04-26 LAB — BASIC METABOLIC PANEL
Anion gap: 7 (ref 5–15)
BUN: 12 mg/dL (ref 8–23)
CO2: 21 mmol/L — AB (ref 22–32)
Calcium: 8.7 mg/dL — ABNORMAL LOW (ref 8.9–10.3)
Chloride: 109 mmol/L (ref 98–111)
Creatinine, Ser: 0.7 mg/dL (ref 0.44–1.00)
GFR calc Af Amer: >60 mL/min (ref >60)
GFR calc non Af Amer: >60 mL/min (ref >60)
Glucose, Bld: 97 mg/dL (ref 70–99)
Potassium: 3.8 mmol/L (ref 3.5–5.1)
Sodium: 137 mmol/L (ref 135–145)

## 2018-04-26 NOTE — Progress Notes (Signed)
PROGRESS NOTE                                                                                                                                                                                                             Patient Demographics:    Kathleen [name], is a 85 y.o. female, DOB - 07-09-1931, IEP:[id number]  Admit date - 04/24/2018   Admitting Physician Phillips Grout, MD  Outpatient Primary MD for the patient is Aletha Halim., PA-C  LOS - 0  Outpatient Specialists: None  Chief Complaint  Patient presents with  . Urinary Tract Infection       Brief Narrative 85 year old female with hypertension, hypothyroidism, history of pancreatitis who was recently treated for UTI with Bactrim, lives home alone was noted by her family to be hallucinating.  No fevers, nausea, vomiting or diarrhea.  2 days back she was hallucinating saying there were bugs in her bathroom when she was seen by her PCP and diagnosed with UTI and started on Bactrim.  However she continued to have hallucinations, called her family member saying there was water on the floor or somebody had cut down the trees in the yard.  At baseline uses a walker and has a pill organizer with which she takes her medication as instructed. Work-up in the ED showed she was hyponatremic with sodium of 124.  Placed in observation for further management.   Subjective:   Patient is feeling better. She feels generally weak. No shortness of breath   Assessment  & Plan :    Principal Problem:   Hyponatremia Suspect this is prerenal with dehydration and concomitant use of HCTZ.  HCTZ discontinued. This has resolved with intravenous saline infusion.  Active Problems: Acute metabolic encephalopathy with hallucinations Possibly due to recent UTI and hyponatremia.  Repeat UA done in the ED was normal.  Head CT unremarkable except for atrophy and small vessel ischemic changes.  Mental status appears to be clearing.  Avoid benzos or narcotics. Synthroid dose adjusted for elevated TSH.  Hypothyroidism TSH mildly elevated.  Synthroid was increased to 100 mcg.   Essential hypertension Continue amlodipine and ACE inhibitor.  Discontinue HCTZ.     Code Status : Full code  Family Communication  : None at bedside  Disposition Plan  : awaiting SNF placement  Barriers For Discharge : Improving  symptoms  Consults  : None Procedures  : CT head  DVT Prophylaxis  :  Lovenox -  Lab Results  Component Value Date   PLT 205 04/25/2018    Antibiotics  :   Anti-infectives (From admission, onward)   None        Objective:   Vitals:   04/26/18 0500 04/26/18 0539 04/26/18 1353 04/26/18 1500  BP:  (!) 133/50 132/62 123/63  Pulse:  64 60 62  Resp:  18 18 17   Temp:  97.8 F (36.6 C) 98 F (36.7 C) 97.8 F (36.6 C)  TempSrc:  Oral Oral Oral  SpO2:  96% 97% 97%  Weight: 65.9 kg     Height:        Wt Readings from Last 3 Encounters:  04/26/18 65.9 kg  09/27/17 68 kg  01/15/15 63.5 kg     Intake/Output Summary (Last 24 hours) at 04/26/2018 1748 Last data filed at 04/26/2018 1354 Gross per 24 hour  Intake 600 ml  Output 1550 ml  Net -950 ml     Physical Exam General exam: Alert, awake, oriented x 3 Respiratory system: Clear to auscultation. Respiratory effort normal. Cardiovascular system:RRR. No murmurs, rubs, gallops. Gastrointestinal system: Abdomen is nondistended, soft and nontender. No organomegaly or masses felt. Normal bowel sounds heard. Central nervous system: Alert and oriented. No focal neurological deficits. Extremities: No C/C/E, +pedal pulses Skin: No rashes, lesions or ulcers Psychiatry: Judgement and insight appear normal. Mood & affect appropriate.      Data Review:    CBC Recent Labs  Lab 04/25/18 0002 04/25/18 0639  WBC 9.6 5.5  HGB 12.2 11.1*  HCT 35.2* 32.6*  PLT 254 205  MCV 88.2 89.6  MCH 30.6 30.5   MCHC 34.7 34.0  RDW 12.3 12.4  LYMPHSABS 1.1  --   MONOABS 1.1*  --   EOSABS 0.1  --   BASOSABS 0.1  --     Chemistries  Recent Labs  Lab 04/25/18 0002 04/25/18 0639 04/26/18 0525  NA 124* 128* 137  K 3.4* 3.6 3.8  CL 94* 98 109  CO2 20* 22 21*  GLUCOSE 131* 107* 97  BUN 17 12 12   CREATININE 0.85 0.63 0.70  CALCIUM 9.0 8.4* 8.7*  AST 27  --   --   ALT 18  --   --   ALKPHOS 106  --   --   BILITOT 0.5  --   --    ------------------------------------------------------------------------------------------------------------------ No results for input(s): CHOL, HDL, LDLCALC, TRIG, CHOLHDL, LDLDIRECT in the last 72 hours.  Lab Results  Component Value Date   HGBA1C 6.3 (H) 01/27/2012   ------------------------------------------------------------------------------------------------------------------ Recent Labs    04/25/18 0002  TSH 4.595*   ------------------------------------------------------------------------------------------------------------------ No results for input(s): VITAMINB12, FOLATE, FERRITIN, TIBC, IRON, RETICCTPCT in the last 72 hours.  Coagulation profile No results for input(s): INR, PROTIME in the last 168 hours.  No results for input(s): DDIMER in the last 72 hours.  Cardiac Enzymes Recent Labs  Lab 04/25/18 0002  TROPONINI <0.03   ------------------------------------------------------------------------------------------------------------------ No results found for: BNP  Inpatient Medications  Scheduled Meds: . amLODipine  5 mg Oral q morning - 10a  . aspirin EC  81 mg Oral QHS  . calcium carbonate  500 mg of elemental calcium Oral Q breakfast  . dorzolamide-timolol  2 drop Both Eyes BID  . famotidine  20 mg Oral Daily  . fluticasone  1 spray Each Nare Daily  . latanoprost  1 drop  Both Eyes QHS  . levothyroxine  100 mcg Oral Q0600  . lisinopril  2.5 mg Oral Daily  . loratadine  10 mg Oral Daily  . multivitamin with minerals  1 tablet  Oral Daily  . omega-3 acid ethyl esters  1 g Oral Daily  . potassium chloride SA  20 mEq Oral BID   Continuous Infusions: . sodium chloride 100 mL/hr at 04/26/18 0538   PRN Meds:.polyvinyl alcohol  Micro Results No results found for this or any previous visit (from the past 240 hour(s)).  Radiology Reports Ct Head Wo Contrast  Result Date: 04/25/2018 CLINICAL DATA:  Altered LOC EXAM: CT HEAD WITHOUT CONTRAST TECHNIQUE: Contiguous axial images were obtained from the base of the skull through the vertex without intravenous contrast. COMPARISON:  CT brain 10/14/2007 FINDINGS: Brain: No acute territorial infarction, hemorrhage or intracranial mass. Chronic appearing small infarct in the left cerebellum. Mild atrophy. Mild small vessel ischemic changes of the white matter. The ventricles are nonenlarged. Vascular: No hyperdense vessels.  Carotid vascular calcification Skull: Fluid in the left mastoid air cells.  No skull fracture Sinuses/Orbits: Mucosal thickening and fluid in the left maxillary sinus. Other: None IMPRESSION: 1. No CT evidence for acute intracranial abnormality. 2. Atrophy and small vessel ischemic changes of the white matter. 3. Fluid in the left mastoid air cells. Electronically Signed   By: Donavan Foil M.D.   On: 04/25/2018 00:40    Time Spent in minutes  25   Kathie Dike M.D on 04/26/2018 at 5:48 PM  Between 7am to 7pm  After 7pm go to [email or web address]   [facility] -  Office  [phone]

## 2018-04-26 NOTE — Care Management Obs Status (Signed)
[place] NOTIFICATION   Patient Details  Name: Kathleen [name] MRN: [id number] Date of Birth: 14-Dec-1931   Medicare Observation Status Notification Given:  Yes    Tommy Medal 04/26/2018, 10:15 AM

## 2018-04-26 NOTE — Evaluation (Signed)
Physical Therapy Evaluation Patient Details Name: Kathleen [name] MRN: [id number] DOB: Mar 21, 1931 Today's Date: 04/26/2018   History of Present Illness  Kathleen [name] is a 85 y.o. female with medical history significant of recent uti treated with bactrim, htn lives at home alone has been noted by family to be hallucinating.  Pt denies any now.  No fevers at home.  No n/v/d.  No family present at this time.  Found to have low na and referred for admission for hyponatremia.  No report of memory issues chronically.    Clinical Impression  Patient presents supine in bed and is agreeable to therapy. Patient below baseline for functional mobility and gait, limited secondary to generalized weakness, fatigue with activity, and fair/poor balance with RW in standing. Pt requires min assist for uprighting trunk and to scoot to EOB with constant verbal cues for hand placement. Pt performs multiple STS from EOB using RW, requiring mod assist for all reps with verbal cues to push from bed and once standing demonstrates BLE genu valgus with flexed knees and flexed trunk despite verbal cues to improve. Pt limited to labored steps at bedside with RW and mod assist for steadying to prevent loss of balance. Pt limited by generalized weakness and deconditioning and denies pain with activities. Pt left up in chair, chair alarm on and with call bell in reach. Patient will benefit from continued physical therapy in hospital and recommended venue below to increase strength, balance, endurance for safe ADLs and gait.     Follow Up Recommendations SNF    Equipment Recommendations  None recommended by PT    Recommendations for Other Services       Precautions / Restrictions Precautions Precautions: Fall Restrictions Weight Bearing Restrictions: No      Mobility  Bed Mobility Overal bed mobility: Needs Assistance Bed Mobility: Supine to Sit     Supine to sit: Min assist     General bed mobility comments: use  of bed rail, elevated HOB, increased time, assistance to upright trunk and to scoot to EOB  Transfers Overall transfer level: Needs assistance Equipment used: Rolling walker (2 wheeled) Transfers: Sit to/from [facility] Sit to Stand: Mod assist Stand pivot transfers: Mod assist       General transfer comment: increased time, unsteadiness in standing, B genu valgus with flexed knees, verbal cues and mod assist to pivot RW during transfer  Ambulation/Gait Ambulation/Gait assistance: Mod assist Gait Distance (Feet): 4 Feet Assistive device: Rolling walker (2 wheeled) Gait Pattern/deviations: Decreased step length - right;Decreased step length - left;Decreased stride length;Shuffle;Trunk flexed Gait velocity: decreased   General Gait Details: 4-5 labored, shuffling steps at bedside, unsteadiness requiring mod assist, maintains BLE in genu valgus with flexed knees with flexed trunk  Stairs            Wheelchair Mobility    Modified Rankin (Stroke Patients Only)       Balance Overall balance assessment: Needs assistance Sitting-balance support: Feet supported;Bilateral upper extremity supported Sitting balance-Leahy Scale: Fair Sitting balance - Comments: seated EOB   Standing balance support: During functional activity;Bilateral upper extremity supported Standing balance-Leahy Scale: Poor Standing balance comment: with RW                             Pertinent Vitals/Pain Pain Assessment: No/denies pain    Home Living Family/patient expects to be discharged to:: Private residence Living Arrangements: Alone(Pt reports family lives "near-by") Available  Help at Discharge: Family Type of Home: House Home Access: Stairs to enter Entrance Stairs-Rails: Right;Left;Can reach both Entrance Stairs-Number of Steps: 3 Home Layout: One level Home Equipment: [place] - 2 wheels;Walker - 4 wheels;Cane - single point;Bedside commode      Prior  Function Level of Independence: Independent with assistive device(s)         Comments: Pt reports being Ind with ADLs, ambulates household and community distances using rollator walker, and family provides transportation into community     Journalist, newspaper        Extremity/Trunk Assessment   Upper Extremity Assessment Upper Extremity Assessment: Generalized weakness    Lower Extremity Assessment Lower Extremity Assessment: Generalized weakness    Cervical / Trunk Assessment Cervical / Trunk Assessment: Kyphotic  Communication   Communication: HOH  Cognition Arousal/Alertness: Awake/alert Behavior During Therapy: WFL for tasks assessed/performed Overall Cognitive Status: Within Functional Limits for tasks assessed                                 General Comments: Pt able to answer all questions appropriately, no noted hallucinations      General Comments      Exercises     Assessment/Plan    PT Assessment Patient needs continued PT services  PT Problem List Decreased strength;Decreased activity tolerance;Decreased balance;Decreased mobility       PT Treatment Interventions Gait training;Stair training;Functional mobility training;Therapeutic activities;Therapeutic exercise;Balance training;Patient/family education    PT Goals (Current goals can be found in the Care Plan section)  Acute Rehab PT Goals Patient Stated Goal: home after rehab PT Goal Formulation: With patient Time For Goal Achievement: 05/10/18 Potential to Achieve Goals: Good    Frequency Min 3X/week   Barriers to discharge        Co-evaluation               AM-PAC PT "6 Clicks" Mobility  Outcome Measure Help needed turning from your back to your side while in a flat bed without using bedrails?: A Little Help needed moving from lying on your back to sitting on the side of a flat bed without using bedrails?: A Lot Help needed moving to and from a bed to a chair  (including a wheelchair)?: A Lot Help needed standing up from a chair using your arms (e.g., wheelchair or bedside chair)?: A Lot Help needed to walk in hospital room?: A Lot Help needed climbing 3-5 steps with a railing? : Total 6 Click Score: 12    End of Session Equipment Utilized During Treatment: Gait belt Activity Tolerance: Patient tolerated treatment well;Patient limited by fatigue Patient left: in chair;with call bell/phone within reach;with chair alarm set Nurse Communication: Mobility status PT Visit Diagnosis: Unsteadiness on feet (R26.81);Other abnormalities of gait and mobility (R26.89);Muscle weakness (generalized) (M62.81)    Time: 3785-8850 PT Time Calculation (min) (ACUTE ONLY): 38 min   Charges:   PT Evaluation $PT Eval Moderate Complexity: 1 Mod PT Treatments $Therapeutic Activity: 23-37 mins        12:10 PM, 04/26/18 Talbot Grumbling, DPT Physical Therapist with [facility] [phone] office

## 2018-04-26 NOTE — NC FL2 (Addendum)
[place] LEVEL OF CARE SCREENING TOOL     IDENTIFICATION  Patient Name: Kathleen [name] Birthdate: 1931-02-21 Sex: female Admission Date (Current Location): 04/24/2018  [facility] and [place] Number:  [employer] and Address:  [facility] [address], [place]      Provider Number: [id number]  Attending Physician Name and Address:  Kathie Dike, MD  Relative Name and Phone Number:  Kathyrn Lass [phone]    Current Level of Care: Hospital Recommended Level of Care: [place] Prior Approval Number:    Date Approved/Denied: 01/17/15 PASRR Number: [id number]  Discharge Plan: SNF    Current Diagnoses: Patient Active Problem List   Diagnosis Date Noted  . Hyponatremia 04/25/2018  . Hallucinations 04/25/2018  . Acute bronchitis 04/24/2018  . CD (contact dermatitis) 04/24/2018  . Dermatitis, eczematoid 04/24/2018  . Allergic reaction 05/22/2016  . Fracture   . DM type 2 goal A1C below 7.5 01/16/2015  . Hip fracture, left ([place]) 01/15/2015  . Varicose veins of leg with complications 50/35/4656  . Exocrine pancreatic insufficiency ([place]) 11/03/2013  . Physical deconditioning 01/29/2012  . Elevated LFTs 01/29/2012  . Pancreatitis 01/27/2012  . Hyperlipidemia 01/27/2012  . Hypokalemia 01/27/2012  . Hypertension   . Hypothyroidism     Orientation RESPIRATION BLADDER Height & Weight     Self, Time, Situation, Place  Normal Continent Weight: 65.9 kg Height:  5' (152.4 cm)  BEHAVIORAL SYMPTOMS/MOOD NEUROLOGICAL BOWEL NUTRITION STATUS      Continent Diet(heart healthy)  AMBULATORY STATUS COMMUNICATION OF NEEDS Skin   Extensive Assist Verbally Normal                       Personal Care Assistance Level of Assistance  Bathing, Feeding, Dressing Bathing Assistance: Limited assistance Feeding assistance: Independent Dressing Assistance: Limited assistance     Functional Limitations Info  Sight,  Hearing, Speech Sight Info: Adequate Hearing Info: Impaired(hard of hearing) Speech Info: Adequate    SPECIAL CARE FACTORS FREQUENCY  PT (By licensed PT)     PT Frequency: 5x/week              Contractures Contractures Info: Not present    Additional Factors Info  Code Status, Allergies Code Status Info: full code Allergies Info: Alendronate,Cephalexin,Clindamycin,Clindamycin/lincomycin,Creon,Doxycycline,Florastor,Penicillins           Current Medications (04/26/2018):  This is the current hospital active medication list Current Facility-Administered Medications  Medication Dose Route Frequency Provider Last Rate Last Dose  . 0.9 %  sodium chloride infusion   Intravenous Continuous Phillips Grout, MD 100 mL/hr at 04/26/18 0538    . amLODipine (NORVASC) tablet 5 mg  5 mg Oral q morning - 10a Dhungel, Nishant, MD   5 mg at 04/26/18 0935  . aspirin EC tablet 81 mg  81 mg Oral QHS Dhungel, Nishant, MD   81 mg at 04/25/18 2126  . calcium carbonate (OS-CAL - dosed in mg of elemental calcium) tablet 500 mg of elemental calcium  500 mg of elemental calcium Oral Q breakfast Dhungel, Nishant, MD   500 mg of elemental calcium at 04/26/18 0805  . dorzolamide-timolol (COSOPT) 22.3-6.8 MG/ML ophthalmic solution 2 drop  2 drop Both Eyes BID Dhungel, Nishant, MD   2 drop at 04/26/18 0935  . famotidine (PEPCID) tablet 20 mg  20 mg Oral Daily Dhungel, Nishant, MD   20 mg at 04/26/18 0935  . fluticasone (FLONASE) 50 MCG/ACT nasal spray 1  spray  1 spray Each Nare Daily Dhungel, Nishant, MD   1 spray at 04/26/18 0935  . latanoprost (XALATAN) 0.005 % ophthalmic solution 1 drop  1 drop Both Eyes QHS Dhungel, Nishant, MD      . levothyroxine (SYNTHROID, LEVOTHROID) tablet 100 mcg  100 mcg Oral Q0600 Dhungel, Nishant, MD   100 mcg at 04/26/18 0539  . lisinopril (PRINIVIL,ZESTRIL) tablet 2.5 mg  2.5 mg Oral Daily Dhungel, Nishant, MD   2.5 mg at 04/26/18 0935  . loratadine (CLARITIN) tablet 10 mg  10  mg Oral Daily Dhungel, Nishant, MD   10 mg at 04/26/18 0935  . multivitamin with minerals tablet 1 tablet  1 tablet Oral Daily Dhungel, Nishant, MD   1 tablet at 04/26/18 0934  . omega-3 acid ethyl esters (LOVAZA) capsule 1 g  1 g Oral Daily Dhungel, Nishant, MD   1 g at 04/26/18 0934  . polyvinyl alcohol (LIQUIFILM TEARS) 1.4 % ophthalmic solution 1 drop  1 drop Both Eyes TID PRN Dhungel, Nishant, MD      . potassium chloride SA (K-DUR,KLOR-CON) CR tablet 20 mEq  20 mEq Oral BID Dhungel, Nishant, MD   20 mEq at 04/26/18 0935     Discharge Medications: Please see discharge summary for a list of discharge medications.  Relevant Imaging Results:  Relevant Lab Results:   Additional Information SSN [id number]  Jalecia Leon, Chauncey Reading, RN

## 2018-04-26 NOTE — Plan of Care (Signed)
  Problem: Acute Rehab PT Goals(only PT should resolve) Goal: Pt Will Go Supine/Side To Sit Outcome: Progressing Flowsheets (Taken 04/26/2018 1210) Pt will go Supine/Side to Sit: with min guard assist Goal: Patient Will Transfer Sit To/From Stand Outcome: Progressing Flowsheets (Taken 04/26/2018 1210) Patient will transfer sit to/from stand: with min guard assist Goal: Pt Will Transfer Bed To Chair/Chair To Bed Outcome: Progressing Flowsheets (Taken 04/26/2018 1210) Pt will Transfer Bed to Chair/Chair to Bed: min guard assist Goal: Pt Will Ambulate Outcome: Progressing Flowsheets (Taken 04/26/2018 1210) Pt will Ambulate: 50 feet; with min guard assist; with rolling walker   12:11 PM, 04/26/18 Talbot Grumbling, DPT Physical Therapist with [facility] [phone] office

## 2018-04-27 ENCOUNTER — Inpatient Hospital Stay
Admission: RE | Admit: 2018-04-27 | Discharge: 2018-05-14 | Disposition: A | Discharge status: Home / Self Care | Payer: PPO | Source: Ambulatory Visit | Attending: Internal Medicine | Admitting: Internal Medicine

## 2018-04-27 DIAGNOSIS — E039 Hypothyroidism, unspecified: Secondary | ICD-10-CM | POA: Diagnosis not present

## 2018-04-27 DIAGNOSIS — E119 Type 2 diabetes mellitus without complications: Secondary | ICD-10-CM | POA: Diagnosis not present

## 2018-04-27 DIAGNOSIS — Z9181 History of falling: Secondary | ICD-10-CM | POA: Diagnosis not present

## 2018-04-27 DIAGNOSIS — R440 Auditory hallucinations: Secondary | ICD-10-CM | POA: Diagnosis not present

## 2018-04-27 DIAGNOSIS — J3089 Other allergic rhinitis: Secondary | ICD-10-CM | POA: Diagnosis not present

## 2018-04-27 DIAGNOSIS — T7840XD Allergy, unspecified, subsequent encounter: Secondary | ICD-10-CM | POA: Diagnosis not present

## 2018-04-27 DIAGNOSIS — K219 Gastro-esophageal reflux disease without esophagitis: Secondary | ICD-10-CM | POA: Diagnosis not present

## 2018-04-27 DIAGNOSIS — E1169 Type 2 diabetes mellitus with other specified complication: Secondary | ICD-10-CM | POA: Diagnosis not present

## 2018-04-27 DIAGNOSIS — R41841 Cognitive communication deficit: Secondary | ICD-10-CM | POA: Diagnosis not present

## 2018-04-27 DIAGNOSIS — E785 Hyperlipidemia, unspecified: Secondary | ICD-10-CM | POA: Diagnosis not present

## 2018-04-27 DIAGNOSIS — Z741 Need for assistance with personal care: Secondary | ICD-10-CM | POA: Diagnosis not present

## 2018-04-27 DIAGNOSIS — M7989 Other specified soft tissue disorders: Secondary | ICD-10-CM | POA: Diagnosis not present

## 2018-04-27 DIAGNOSIS — H401134 Primary open-angle glaucoma, bilateral, indeterminate stage: Secondary | ICD-10-CM | POA: Diagnosis not present

## 2018-04-27 DIAGNOSIS — Z96642 Presence of left artificial hip joint: Secondary | ICD-10-CM | POA: Diagnosis not present

## 2018-04-27 DIAGNOSIS — K8681 Exocrine pancreatic insufficiency: Secondary | ICD-10-CM | POA: Diagnosis not present

## 2018-04-27 DIAGNOSIS — R441 Visual hallucinations: Secondary | ICD-10-CM | POA: Diagnosis not present

## 2018-04-27 DIAGNOSIS — K5790 Diverticulosis of intestine, part unspecified, without perforation or abscess without bleeding: Secondary | ICD-10-CM | POA: Diagnosis not present

## 2018-04-27 DIAGNOSIS — R443 Hallucinations, unspecified: Secondary | ICD-10-CM | POA: Diagnosis not present

## 2018-04-27 DIAGNOSIS — R262 Difficulty in walking, not elsewhere classified: Secondary | ICD-10-CM | POA: Diagnosis not present

## 2018-04-27 DIAGNOSIS — K861 Other chronic pancreatitis: Secondary | ICD-10-CM | POA: Diagnosis not present

## 2018-04-27 DIAGNOSIS — I10 Essential (primary) hypertension: Secondary | ICD-10-CM | POA: Diagnosis not present

## 2018-04-27 DIAGNOSIS — K5900 Constipation, unspecified: Secondary | ICD-10-CM | POA: Diagnosis not present

## 2018-04-27 DIAGNOSIS — E876 Hypokalemia: Secondary | ICD-10-CM | POA: Diagnosis not present

## 2018-04-27 DIAGNOSIS — M1990 Unspecified osteoarthritis, unspecified site: Secondary | ICD-10-CM | POA: Diagnosis not present

## 2018-04-27 DIAGNOSIS — E871 Hypo-osmolality and hyponatremia: Secondary | ICD-10-CM | POA: Diagnosis not present

## 2018-04-27 DIAGNOSIS — M6281 Muscle weakness (generalized): Secondary | ICD-10-CM | POA: Diagnosis not present

## 2018-04-27 DIAGNOSIS — E1159 Type 2 diabetes mellitus with other circulatory complications: Secondary | ICD-10-CM | POA: Diagnosis not present

## 2018-04-27 DIAGNOSIS — R509 Fever, unspecified: Secondary | ICD-10-CM | POA: Diagnosis not present

## 2018-04-27 MED ORDER — LEVOTHYROXINE SODIUM 100 MCG PO TABS: 100.0000 ug | ORAL_TABLET | Freq: Every day | ORAL | Status: DC

## 2018-04-27 MED ORDER — CALCIUM CARBONATE 1500 (600 CA) MG PO TABS: 600.0000 mg | ORAL_TABLET | Freq: Two times a day (BID) | ORAL | 0 refills | Status: DC

## 2018-04-27 MED ORDER — LATANOPROST 0.005 % OP SOLN: 1.0000 [drp] | Freq: Every day | OPHTHALMIC | 12 refills | Status: DC

## 2018-04-27 NOTE — Discharge Summary (Signed)
Physician Discharge Summary  Kathleen [name] BOF:[id number] DOB: Sep 22, 1931 DOA: 04/24/2018  PCP: Aletha Halim., PA-C  Admit date: 04/24/2018 Discharge date: 04/27/2018  Admitted From: home Disposition:  SNF  Recommendations for Outpatient Follow-up:  1. Follow up with PCP in 1-2 weeks 2. Please obtain BMP/CBC in one week  Discharge Condition:stable CODE STATUS: full code Diet recommendation: heart healthy  Brief/Interim Summary: 85 year old female with a history of hypertension, hypothyroidism, chronic pancreatitis, was recently treated for UTI with Bactrim, was brought to the hospital by her family when she was noted to be hallucinating.  She had no fever, nausea, vomiting or diarrhea.  Work-up in the emergency room showed that she was significantly hyponatremic with a serum sodium of 124.  It was noted that she was taking a thiazide diuretic which was discontinued on admission.  Patient was admitted to the hospital and started on intravenous saline infusion.  Serum sodium has improved and is normal range.  She is not had any further hallucinations.  Mental status appears to be back to baseline.  Urinalysis done in the emergency room was noted to be normal.  CT head was unremarkable.  TSH was mildly elevated and Synthroid dose was adjusted.  Her blood pressure stable on amlodipine and ACE inhibitor.  She is seen by physical therapy with recommendations for skilled nursing facility placement.  Discharge Diagnoses:  Principal Problem:   Hyponatremia Active Problems:   Hypertension   Hyperlipidemia   Hallucinations    Discharge Instructions  Discharge Instructions    Diet - low sodium heart healthy   Complete by:  As directed    Increase activity slowly   Complete by:  As directed      Allergies as of 04/27/2018      Reactions   Alendronate    Unknown reaction   Cephalexin    Unknown reaction   Clindamycin Hcl Other (See Comments)   Clindamycin/lincomycin Hives   Creon  [pancreatin] Hives   Doxycycline    Unknown reaction   Florastor [yeast] Hives   Other Other (See Comments), Hives   Penicillins    Has patient had a PCN reaction causing immediate rash, facial/tongue/throat swelling, SOB or lightheadedness with hypotension: unknown Has patient had a PCN reaction causing severe rash involving mucus membranes or skin necrosis: unknown Has patient had a PCN reaction that required hospitalization unknown Has patient had a PCN reaction occurring within the last 10 years:unknown If all of the above answers are "NO", then may proceed with Cephalosporin use.   Zenpep [pancrelipase (lip-prot-amyl)] Hives      Medication List    STOP taking these medications   hydrochlorothiazide 25 MG tablet Commonly known as:  HYDRODIURIL   sulfamethoxazole-trimethoprim 800-160 MG tablet Commonly known as:  BACTRIM DS,SEPTRA DS   Zioptan 0.0015 % Soln Generic drug:  Tafluprost (PF) Replaced by:  latanoprost 0.005 % ophthalmic solution     TAKE these medications   amLODipine 5 MG tablet Commonly known as:  NORVASC Take 5 mg by mouth every morning.   aspirin EC 81 MG tablet Take 81 mg by mouth at bedtime.   calcium carbonate 1500 (600 Ca) MG Tabs tablet Commonly known as:  Caltrate 600 Take 1 tablet (1,500 mg total) by mouth 2 (two) times daily with a meal. What changed:    medication strength  how much to take  when to take this   carboxymethylcellulose 0.5 % Soln Commonly known as:  REFRESH PLUS 1 drop 3 (three) times daily  as needed.   Cosopt PF 22.3-6.8 MG/ML Soln ophthalmic solution Generic drug:  dorzolamidel-timolol Place 1 drop into both eyes 2 (two) times daily.   Fish Oil 500 MG Caps Take 2 capsules by mouth daily.   fluticasone 50 MCG/ACT nasal spray Commonly known as:  FLONASE Place 1 spray into both nostrils daily.   latanoprost 0.005 % ophthalmic solution Commonly known as:  XALATAN Place 1 drop into both eyes at  bedtime. Replaces:  Zioptan 0.0015 % Soln   levocetirizine 5 MG tablet Commonly known as:  XYZAL Take 5 mg by mouth every evening.   levothyroxine 100 MCG tablet Commonly known as:  SYNTHROID, LEVOTHROID Take 1 tablet (100 mcg total) by mouth daily. What changed:    medication strength  how much to take  how to take this   lisinopril 2.5 MG tablet Commonly known as:  PRINIVIL,ZESTRIL Take 2.5 mg by mouth daily.   loperamide 2 MG capsule Commonly known as:  IMODIUM Take 2 mg by mouth as needed for diarrhea or loose stools.   metFORMIN 500 MG 24 hr tablet Commonly known as:  GLUCOPHAGE-XR Take 500 mg by mouth daily with breakfast.   multivitamin with minerals Tabs tablet Take 1 tablet by mouth daily.   potassium chloride SA 20 MEQ tablet Commonly known as:  K-DUR,KLOR-CON Take 20 mEq by mouth 2 (two) times daily.   ranitidine 150 MG tablet Commonly known as:  ZANTAC Take 150 mg by mouth 2 (two) times daily.   Zenpep 20000-63000 units Cpep Generic drug:  Pancrelipase (Lip-Prot-Amyl) TAKE ONE CAPSULE BY MOUTH THREE TIMES DAILY BEFORE MEALS      Contact information for after-discharge care    [facility] .   Service:  Skilled Nursing Contact information: [address] [address] [phone]             Allergies  Allergen Reactions  . Alendronate     Unknown reaction  . Cephalexin     Unknown reaction  . Clindamycin Hcl Other (See Comments)  . Clindamycin/Lincomycin Hives  . Creon [Pancreatin] Hives  . Doxycycline     Unknown reaction  . Florastor [Yeast] Hives  . Other Other (See Comments) and Hives  . Penicillins     Has patient had a PCN reaction causing immediate rash, facial/tongue/throat swelling, SOB or lightheadedness with hypotension: unknown Has patient had a PCN reaction causing severe rash involving mucus membranes or skin necrosis: unknown Has patient had a PCN  reaction that required hospitalization unknown Has patient had a PCN reaction occurring within the last 10 years:unknown If all of the above answers are "NO", then may proceed with Cephalosporin use.   Marland Kitchen Zenpep [Pancrelipase (Lip-Prot-Amyl)] Hives    Consultations:     Procedures/Studies: Ct Head Wo Contrast  Result Date: 04/25/2018 CLINICAL DATA:  Altered LOC EXAM: CT HEAD WITHOUT CONTRAST TECHNIQUE: Contiguous axial images were obtained from the base of the skull through the vertex without intravenous contrast. COMPARISON:  CT brain 10/14/2007 FINDINGS: Brain: No acute territorial infarction, hemorrhage or intracranial mass. Chronic appearing small infarct in the left cerebellum. Mild atrophy. Mild small vessel ischemic changes of the white matter. The ventricles are nonenlarged. Vascular: No hyperdense vessels.  Carotid vascular calcification Skull: Fluid in the left mastoid air cells.  No skull fracture Sinuses/Orbits: Mucosal thickening and fluid in the left maxillary sinus. Other: None IMPRESSION: 1. No CT evidence for acute intracranial abnormality. 2. Atrophy and  small vessel ischemic changes of the white matter. 3. Fluid in the left mastoid air cells. Electronically Signed   By: Donavan Foil M.D.   On: 04/25/2018 00:40       Subjective: No new complaints.  No shortness of breath.  Still feels weak.  Discharge Exam: Vitals:   04/26/18 1500 04/26/18 2129 04/27/18 0625 04/27/18 1410  BP: 123/63 138/65 140/69 (!) 159/71  Pulse: 62 (!) 54 (!) 57 77  Resp: 17 16 20 18   Temp: 97.8 F (36.6 C) 97.9 F (36.6 C) 98.5 F (36.9 C) 98 F (36.7 C)  TempSrc: Oral Oral Oral   SpO2: 97% 96% 98% 98%  Weight:      Height:        General: Pt is alert, awake, not in acute distress Cardiovascular: RRR, S1/S2 +, no rubs, no gallops Respiratory: CTA bilaterally, no wheezing, no rhonchi Abdominal: Soft, NT, ND, bowel sounds + Extremities: no edema, no cyanosis    The results of  significant diagnostics from this hospitalization (including imaging, microbiology, ancillary and laboratory) are listed below for reference.     Microbiology: No results found for this or any previous visit (from the past 240 hour(s)).   Labs: BNP (last 3 results) No results for input(s): BNP in the last 8760 hours. Basic Metabolic Panel: Recent Labs  Lab 04/25/18 0002 04/25/18 0639 04/26/18 0525  NA 124* 128* 137  K 3.4* 3.6 3.8  CL 94* 98 109  CO2 20* 22 21*  GLUCOSE 131* 107* 97  BUN 17 12 12   CREATININE 0.85 0.63 0.70  CALCIUM 9.0 8.4* 8.7*   Liver Function Tests: Recent Labs  Lab 04/25/18 0002  AST 27  ALT 18  ALKPHOS 106  BILITOT 0.5  PROT 6.9  ALBUMIN 4.1   No results for input(s): LIPASE, AMYLASE in the last 168 hours. No results for input(s): AMMONIA in the last 168 hours. CBC: Recent Labs  Lab 04/25/18 0002 04/25/18 0639  WBC 9.6 5.5  NEUTROABS 7.2  --   HGB 12.2 11.1*  HCT 35.2* 32.6*  MCV 88.2 89.6  PLT 254 205   Cardiac Enzymes: Recent Labs  Lab 04/25/18 0002  TROPONINI <0.03   BNP: Invalid input(s): POCBNP CBG: Recent Labs  Lab 04/25/18 2142  GLUCAP 118*   D-Dimer No results for input(s): DDIMER in the last 72 hours. Hgb A1c No results for input(s): HGBA1C in the last 72 hours. Lipid Profile No results for input(s): CHOL, HDL, LDLCALC, TRIG, CHOLHDL, LDLDIRECT in the last 72 hours. Thyroid function studies Recent Labs    04/25/18 0002  TSH 4.595*   Anemia work up No results for input(s): VITAMINB12, FOLATE, FERRITIN, TIBC, IRON, RETICCTPCT in the last 72 hours. Urinalysis    Component Value Date/Time   COLORURINE YELLOW 04/24/2018 2324   APPEARANCEUR CLEAR 04/24/2018 2324   LABSPEC 1.010 04/24/2018 2324   PHURINE 6.0 04/24/2018 2324   GLUCOSEU NEGATIVE 04/24/2018 2324   HGBUR NEGATIVE 04/24/2018 2324   BILIRUBINUR NEGATIVE 04/24/2018 2324   KETONESUR NEGATIVE 04/24/2018 2324   PROTEINUR NEGATIVE 04/24/2018 2324    UROBILINOGEN 0.2 03/29/2013 1332   NITRITE NEGATIVE 04/24/2018 2324   LEUKOCYTESUR NEGATIVE 04/24/2018 2324   Sepsis Labs Invalid input(s): PROCALCITONIN,  WBC,  LACTICIDVEN Microbiology No results found for this or any previous visit (from the past 240 hour(s)).   Time coordinating discharge: 72mins  SIGNED:   Kathie Dike, MD  [facility] 04/27/2018, 2:36 PM   If 7PM-7AM, please contact night-coverage  [email or web address]

## 2018-04-27 NOTE — TOC Transition Note (Signed)
Transition of Care [facility]) - CM/SW Discharge Note   Patient Details  Name: Kathleen [name] MRN: [id number] Date of Birth: [birth year]  [facility]) CM/SW Contact:  Franci Oshana, Chauncey Reading, RN Phone Number: 04/27/2018, 2:00 PM  Patient has received authorization (# [id number]). Attending notified. Kerri of [facility] aware. Patient will be going to room number 143. Bedside RN can call report at [phone].   Final next level of care: Skilled Nursing Facility Barriers to Discharge: No Barriers Identified   Patient Goals and CMS Choice Patient states their goals for this hospitalization and ongoing recovery are:: go to SNF and get back home CMS Medicare.gov Compare Post Acute Care list provided to:: Patient Choice offered to / list presented to : Patient               Patient chooses bed at: [facility] Patient to be transferred to facility by: [facility] staff  Name of family member notified: Kathyrn Lass Patient and family notified of of transfer: 04/27/18     Readmission Risk Interventions No flowsheet data found.

## 2018-04-27 NOTE — Progress Notes (Signed)
Report called to Jocelyn Lamer at the [facility] at this time

## 2018-04-27 NOTE — Care Management Important Message (Signed)
Important Message  Patient Details  Name: Kathleen [name] MRN: [id number] Date of Birth: 1931/12/11   Medicare Important Message Given:  Yes    Sailor Hevia, Chauncey Reading, RN 04/27/2018, 2:10 PM

## 2018-04-27 NOTE — Plan of Care (Signed)

## 2018-04-27 NOTE — Progress Notes (Signed)
Physical Therapy Treatment Patient Details Name: Kathleen [name] MRN: [id number] DOB: [birth year] Today's Date: 04/27/2018    History of Present Illness Kathleen [name] is a 85 y.o. female with medical history significant of recent uti treated with bactrim, htn lives at home alone has been noted by family to be hallucinating.  Pt denies any now.  No fevers at home.  No n/v/d.  No family present at this time.  Found to have low na and referred for admission for hyponatremia.  No report of memory issues chronically.    PT Comments    Patient presents supine in bed and agreeable to therapy. Pt continues to require verbal cues for hand placement and assistance to upright trunk with supine to sit transfer. Pt completes sit to stand reps from EOB and BSC with RW requiring min assist and verbal cues for hand placement. Pt tolerates multiple stand pivot transfers going from bed/BSC/chair using RW requiring mod assist with pivoting and RW management to maintain safety due to unsteadiness in standing. Pt with flexed trunk and flexed B knees with genu valgus posture throughout transfer training and with steps at bedside improving minimally with verbal cues. Pt continues to be limited by weakness and general deconditioning. Patient will benefit from continued physical therapy in hospital and recommended venue below to increase strength, balance, endurance for safe ADLs and gait.    Follow Up Recommendations  SNF     Equipment Recommendations  None recommended by PT    Recommendations for Other Services       Precautions / Restrictions Precautions Precautions: Fall Restrictions Weight Bearing Restrictions: No    Mobility  Bed Mobility Overal bed mobility: Needs Assistance Bed Mobility: Supine to Sit     Supine to sit: Min assist     General bed mobility comments: increased time, verbal cues for sequencing, assistance to upright trunk and to scoot to EOB  Transfers Overall transfer level:  Needs assistance Equipment used: Rolling walker (2 wheeled) Transfers: Sit to/from [facility] Sit to Stand: Min assist Stand pivot transfers: Mod assist       General transfer comment: increased time, unsteadiness in standing, B genu valgus with flexed knees, verbal cues for hand placement and shifting weight into feet  Ambulation/Gait Ambulation/Gait assistance: Min assist Gait Distance (Feet): 4 Feet Assistive device: Rolling walker (2 wheeled) Gait Pattern/deviations: Decreased step length - right;Decreased step length - left;Decreased stride length;Shuffle;Trunk flexed Gait velocity: decreased   General Gait Details: 4-5 labored, steps at bedside, unsteadiness improving with time, shuffling step progression with BLE in genu valgus with flexed knees with flexed trunk, limited secondary to fatigue   Stairs             Wheelchair Mobility    Modified Rankin (Stroke Patients Only)       Balance Overall balance assessment: Needs assistance Sitting-balance support: Feet supported;Bilateral upper extremity supported Sitting balance-Leahy Scale: Fair Sitting balance - Comments: seated EOB   Standing balance support: During functional activity;Bilateral upper extremity supported Standing balance-Leahy Scale: Poor Standing balance comment: with RW                            Cognition Arousal/Alertness: Awake/alert Behavior During Therapy: WFL for tasks assessed/performed Overall Cognitive Status: Within Functional Limits for tasks assessed  Exercises      General Comments        Pertinent Vitals/Pain Pain Assessment: No/denies pain    Home Living                      Prior Function            PT Goals (current goals can now be found in the care plan section) Acute Rehab PT Goals Patient Stated Goal: home after rehab PT Goal Formulation: With patient Time For  Goal Achievement: 05/10/18 Potential to Achieve Goals: Good Progress towards PT goals: Progressing toward goals    Frequency    Min 3X/week      PT Plan Current plan remains appropriate    Co-evaluation              AM-PAC PT "6 Clicks" Mobility   Outcome Measure  Help needed turning from your back to your side while in a flat bed without using bedrails?: A Little Help needed moving from lying on your back to sitting on the side of a flat bed without using bedrails?: A Lot Help needed moving to and from a bed to a chair (including a wheelchair)?: A Lot Help needed standing up from a chair using your arms (e.g., wheelchair or bedside chair)?: A Lot Help needed to walk in hospital room?: A Lot Help needed climbing 3-5 steps with a railing? : Total 6 Click Score: 12    End of Session   Activity Tolerance: Patient tolerated treatment well;Patient limited by fatigue Patient left: in chair;with call bell/phone within reach;with chair alarm set Nurse Communication: Mobility status PT Visit Diagnosis: Unsteadiness on feet (R26.81);Other abnormalities of gait and mobility (R26.89);Muscle weakness (generalized) (M62.81)     Time: 5364-6803 PT Time Calculation (min) (ACUTE ONLY): 13 min  Charges:  $Therapeutic Activity: 8-22 mins                     10:48 AM, 04/27/18 Talbot Grumbling, DPT Physical Therapist with [facility] [phone] office

## 2018-04-28 ENCOUNTER — Non-Acute Institutional Stay (SKILLED_NURSING_FACILITY): Payer: PPO | Admitting: Adult Health

## 2018-04-28 ENCOUNTER — Encounter: Payer: Self-pay | Admitting: Adult Health

## 2018-04-28 DIAGNOSIS — K5900 Constipation, unspecified: Secondary | ICD-10-CM | POA: Insufficient documentation

## 2018-04-28 DIAGNOSIS — E1169 Type 2 diabetes mellitus with other specified complication: Secondary | ICD-10-CM | POA: Diagnosis not present

## 2018-04-28 DIAGNOSIS — K861 Other chronic pancreatitis: Secondary | ICD-10-CM | POA: Diagnosis not present

## 2018-04-28 DIAGNOSIS — K219 Gastro-esophageal reflux disease without esophagitis: Secondary | ICD-10-CM | POA: Diagnosis not present

## 2018-04-28 DIAGNOSIS — I10 Essential (primary) hypertension: Secondary | ICD-10-CM | POA: Diagnosis not present

## 2018-04-28 DIAGNOSIS — E876 Hypokalemia: Secondary | ICD-10-CM | POA: Diagnosis not present

## 2018-04-28 DIAGNOSIS — E119 Type 2 diabetes mellitus without complications: Secondary | ICD-10-CM

## 2018-04-28 DIAGNOSIS — J3089 Other allergic rhinitis: Secondary | ICD-10-CM | POA: Insufficient documentation

## 2018-04-28 DIAGNOSIS — K8681 Exocrine pancreatic insufficiency: Secondary | ICD-10-CM | POA: Diagnosis not present

## 2018-04-28 DIAGNOSIS — E785 Hyperlipidemia, unspecified: Secondary | ICD-10-CM

## 2018-04-28 DIAGNOSIS — H401134 Primary open-angle glaucoma, bilateral, indeterminate stage: Secondary | ICD-10-CM | POA: Diagnosis not present

## 2018-04-28 DIAGNOSIS — E039 Hypothyroidism, unspecified: Secondary | ICD-10-CM

## 2018-04-28 DIAGNOSIS — E1159 Type 2 diabetes mellitus with other circulatory complications: Secondary | ICD-10-CM

## 2018-04-28 DIAGNOSIS — I152 Hypertension secondary to endocrine disorders: Secondary | ICD-10-CM

## 2018-04-28 DIAGNOSIS — H401190 Primary open-angle glaucoma, unspecified eye, stage unspecified: Secondary | ICD-10-CM | POA: Insufficient documentation

## 2018-04-28 NOTE — Progress Notes (Signed)
Location:   [facility] Room Number: 143 P Place of Service:  SNF (31)   CODE STATUS: Full Code  Allergies  Allergen Reactions  . Alendronate     Unknown reaction  . Cephalexin Other (See Comments)    Unknown reaction Unknown reaction Unknown reaction  . Clindamycin Hcl Other (See Comments)  . Clindamycin/Lincomycin Hives  . Creon [Pancreatin] Hives  . Doxycycline Other (See Comments)    Unknown reaction Unknown reaction  . Florastor [Yeast] Hives  . Other Other (See Comments) and Hives  . Penicillins Other (See Comments)    Has patient had a PCN reaction causing immediate rash, facial/tongue/throat swelling, SOB or lightheadedness with hypotension: unknown Has patient had a PCN reaction causing severe rash involving mucus membranes or skin necrosis: unknown Has patient had a PCN reaction that required hospitalization unknown Has patient had a PCN reaction occurring within the last 10 years:unknown If all of the above answers are "NO", then may proceed with Cephalosporin use.  Has patient had a PCN reaction causing immediate rash, facial/tongue/throat swelling, SOB or lightheadedness with hypotension: unknown Has patient had a PCN reaction causing severe rash involving mucus membranes or skin necrosis: unknown Has patient had a PCN reaction that required hospitalization unknown Has patient had a PCN reaction occurring within the last 10 years:unknown If all of the above answers are "NO", then may proceed with Cephalosporin use.  Marland Kitchen Zenpep [Pancrelipase (Lip-Prot-Amyl)] Hives    Chief Complaint  Patient presents with  . Hospitalization Follow-up    Hospital follow up    HPI:  She is a 85 year old woman who had been treated for an uti prior to her hospitalization. She was admitted with increased weakness; confusion; hallucinations. She was treated for hyponatremia. She is here for short term rehab with her gaol to return back home. She is  complaining of constipation and nausea. There are no reports of uncontrolled pain present. She will continue to followed for her chronic illnesses including: diabetes; hypertension; hypothyroidism.   Past Medical History:  Diagnosis Date  . Arthritis   . Diabetes mellitus without complication ([place])    "borderline"  . Diverticulosis of colon   . High cholesterol   . Hypertension   . Hypothyroidism   . Pancreatitis     Past Surgical History:  Procedure Laterality Date  . ABDOMINAL HYSTERECTOMY     partial  . APPENDECTOMY    . bladder tac    . CHOLECYSTECTOMY  1999   cholelithiasis  . COLONOSCOPY  01/2007   Dr Olevia Perches: normal  . COLONOSCOPY  12/2000   Dr Deatra Ina- diverticulosis  . ESOPHAGOGASTRODUODENOSCOPY     Dr Damaris Hippo cannot remember  . EUS N/A 03/24/2012   Dr. Paulita Fujita: suspected passed CBD stone as cause of acute pancreatitis, suspective chronic pancreatitis.   Marland Kitchen EXCISION OF BREAST BIOPSY    . HIP PINNING,CANNULATED Left 01/15/2015   Procedure: CANNULATED HIP PINNING/LEFT;  Surgeon: Renette Butters, MD;  Location: [facility];  Service: Orthopedics;  Laterality: Left;  Marland Kitchen VEIN SURGERY      Social History   Socioeconomic History  . Marital status: Married    Spouse name: Not on file  . Number of children: 1  . Years of education: Not on file  . Highest education level: Not on file  Occupational History  . Occupation: retired, tobacco field  Social Needs  . Financial resource strain: Not on file  . Food insecurity:    Worry: Not  on file    Inability: Not on file  . Transportation needs:    Medical: Not on file    Non-medical: Not on file  Tobacco Use  . Smoking status: Never Smoker  . Smokeless tobacco: Never Used  Substance and Sexual Activity  . Alcohol use: No    Alcohol/week: 0.0 standard drinks  . Drug use: No  . Sexual activity: Not on file  Lifestyle  . Physical activity:    Days per week: Not on file    Minutes per session: Not on file  . Stress: Not  on file  Relationships  . Social connections:    Talks on phone: Not on file    Gets together: Not on file    Attends religious service: Not on file    Active member of club or organization: Not on file    Attends meetings of clubs or organizations: Not on file    Relationship status: Not on file  . Intimate partner violence:    Fear of current or ex partner: Not on file    Emotionally abused: Not on file    Physically abused: Not on file    Forced sexual activity: Not on file  Other Topics Concern  . Not on file  Social History Narrative   Lost 1 son age 24-?MI   1 living son         Family History  Problem Relation Age of Onset  . Diabetes Father   . Diabetes Mother   . Cancer Mother        ovarian  . Cancer Sister 32       breast  . Cancer Sister 19       bone      VITAL SIGNS BP (!) 109/58   Pulse 64   Temp (!) 97.2 F (36.2 C)   Resp 20   Ht 5' (1.524 m)   Wt 145 lb 9.6 oz (66 kg)   BMI 28.44 kg/m   Outpatient Encounter Medications as of 04/28/2018  Medication Sig  . amLODipine (NORVASC) 5 MG tablet Take 5 mg by mouth every morning.   Marland Kitchen aspirin EC 81 MG tablet Take 81 mg by mouth at bedtime.   . calcium carbonate (CALTRATE 600) 1500 (600 Ca) MG TABS tablet Take 1 tablet (1,500 mg total) by mouth 2 (two) times daily with a meal.  . carboxymethylcellulose (REFRESH PLUS) 0.5 % SOLN 1 drop 3 (three) times daily as needed.  . COSOPT PF 22.3-6.8 MG/ML SOLN Place 1 drop into both eyes 2 (two) times daily.   . fluticasone (FLONASE) 50 MCG/ACT nasal spray Place 1 spray into both nostrils daily.  Marland Kitchen latanoprost (XALATAN) 0.005 % ophthalmic solution Place 1 drop into both eyes at bedtime.  Marland Kitchen levocetirizine (XYZAL) 5 MG tablet Take 5 mg by mouth every evening.  Marland Kitchen levothyroxine (SYNTHROID, LEVOTHROID) 100 MCG tablet Take 1 tablet (100 mcg total) by mouth daily.  Marland Kitchen lisinopril (PRINIVIL,ZESTRIL) 2.5 MG tablet Take 2.5 mg by mouth daily.   Marland Kitchen loperamide (IMODIUM) 2 MG  capsule Take 2 mg by mouth as needed for diarrhea or loose stools.  . metFORMIN (GLUCOPHAGE-XR) 500 MG 24 hr tablet Take 500 mg by mouth daily with breakfast.   . Multiple Vitamin (MULTIVITAMIN WITH MINERALS) TABS tablet Take 1 tablet by mouth daily.  . NON FORMULARY Diet Type: NAS  . Omega-3 Fatty Acids (FISH OIL) 500 MG CAPS Take 2 capsules by mouth daily.   . potassium chloride  SA (K-DUR,KLOR-CON) 20 MEQ tablet Take 20 mEq by mouth 2 (two) times daily.   . ranitidine (ZANTAC) 150 MG tablet Take 150 mg by mouth 2 (two) times daily.  Marland Kitchen ZENPEP 20000-63000 units CPEP TAKE ONE CAPSULE BY MOUTH THREE TIMES DAILY BEFORE MEALS   No facility-administered encounter medications on file as of 04/28/2018.      SIGNIFICANT DIAGNOSTIC EXAMS  TODAY:   04-24-18: ct of head; 1. No CT evidence for acute intracranial abnormality. 2. Atrophy and small vessel ischemic changes of the white matter. 3. Fluid in the left mastoid air cells.  LABS REVIEWED:   04-25-18: wbc 96; hgb 12.2; hct 35.2; mcv 88.2; plt 254; glucose 131; bun 17; creat 0.85; k+ 3.4; na++ 124; ca 9.0; liver normal albumin 4.1 tsh 4.595  04-26-18: glucose 97; bun 12; creat 0.70; k+ 3.8; na++ 137; ca 8.7   Review of Systems  Constitutional: Negative for malaise/fatigue.  Respiratory: Negative for cough and shortness of breath.   Cardiovascular: Negative for chest pain, palpitations and leg swelling.  Gastrointestinal: Positive for constipation and nausea. Negative for abdominal pain and heartburn.  Musculoskeletal: Negative for back pain, joint pain and myalgias.  Skin: Negative.   Neurological: Negative for dizziness.  Psychiatric/Behavioral: The patient is not nervous/anxious.     Physical Exam Constitutional:      General: She is not in acute distress.    Appearance: She is well-developed. She is not diaphoretic.  Neck:     Musculoskeletal: Neck supple.     Thyroid: No thyromegaly.  Cardiovascular:     Rate and Rhythm: Normal  rate and regular rhythm.     Pulses: Normal pulses.     Heart sounds: Normal heart sounds.  Pulmonary:     Effort: Pulmonary effort is normal. No respiratory distress.     Breath sounds: Normal breath sounds.  Abdominal:     General: Bowel sounds are normal. There is no distension.     Palpations: Abdomen is soft.     Tenderness: There is no abdominal tenderness.  Musculoskeletal:     Right lower leg: No edema.     Left lower leg: No edema.     Comments: Is able to move all extremities   Lymphadenopathy:     Cervical: No cervical adenopathy.  Skin:    General: Skin is warm and dry.  Neurological:     Mental Status: She is alert and oriented to person, place, and time.  Psychiatric:        Mood and Affect: Mood normal.      ASSESSMENT/ PLAN:  TODAY;   1. Hypertension associated with type 2 diabetes mellitus:  is stable b/p 109/58: will continue norvasc 5 mg daily; asa 81 mg daily lisinopril 2.5 mg daily.  Her HCTZ was stopped in the hospital.   2. Controlled type 2 diabetes mellitus without complication without long term current use of insulin: is stable will continue metformin xr 500 mg daily   3. Acquired hypothyroidism: is stable TSH 4.595 will continue synthroid 100 mcg daily dose was adjusted in the hospital   4. Chronic non-seasonal allergic rhinitis: is stable will continue flonase daily; xyzal 5 mg daily   5.  Hypokalemia: is stable k+ 3.8 will continue k+ 20 meq twice daily   6. GERD without esophagitis: is stable will continue zantac 150 mg twice daily   7. Hyperlipidemia associated with type 2 diabetes mellitus: is stable will continue fish oil 1 gm daily  8. Chronic open  angle glaucoma of both eyes indeterminate stage: is stable will continue cosopt to both eyes twice daily and xalatan to both eyes nightly   9. Exocrine pancreatic insufficiency/chronic pancreatitis unspecified pancreatitis: is stable will continue zenpep 20,000-63,000 units with meals   10.  Hyponatremia: is stable na++ is 137; will continue to monitor her status.   11. Physical deconditioning: is without change will continue therapy as directed to improve upon her independence with her alds. Will monitor   12. Constipation in female: is worse; will give dulcolax supp X1 now and will begin colace twice daily   Will check cbc; cmp on 04-30-18    MD is aware of resident's narcotic use and is in agreement with current plan of care. We will attempt to wean resident as apropriate   Ok Edwards NP [facility]  Contact [phone] Monday through Friday 8am- 5pm  After hours call [phone]

## 2018-04-29 ENCOUNTER — Non-Acute Institutional Stay (SKILLED_NURSING_FACILITY): Payer: PPO | Admitting: Internal Medicine

## 2018-04-29 ENCOUNTER — Encounter: Payer: Self-pay | Admitting: Internal Medicine

## 2018-04-29 DIAGNOSIS — E871 Hypo-osmolality and hyponatremia: Secondary | ICD-10-CM | POA: Diagnosis not present

## 2018-04-29 DIAGNOSIS — E119 Type 2 diabetes mellitus without complications: Secondary | ICD-10-CM | POA: Diagnosis not present

## 2018-04-29 DIAGNOSIS — R440 Auditory hallucinations: Secondary | ICD-10-CM | POA: Diagnosis not present

## 2018-04-29 DIAGNOSIS — E1169 Type 2 diabetes mellitus with other specified complication: Secondary | ICD-10-CM | POA: Diagnosis not present

## 2018-04-29 DIAGNOSIS — I10 Essential (primary) hypertension: Secondary | ICD-10-CM

## 2018-04-29 DIAGNOSIS — E1159 Type 2 diabetes mellitus with other circulatory complications: Secondary | ICD-10-CM

## 2018-04-29 DIAGNOSIS — K861 Other chronic pancreatitis: Secondary | ICD-10-CM

## 2018-04-29 DIAGNOSIS — E785 Hyperlipidemia, unspecified: Secondary | ICD-10-CM

## 2018-04-29 NOTE — Progress Notes (Signed)
Provider:  Veleta Miners, MD Location:  [place] Room Number: 143 P Place of Service:  SNF (31)  PCP: Aletha Halim., PA-C Patient Care Team: Aletha Halim., PA-C as PCP - General (Family Medicine) Danie Binder, MD as Attending Physician (Gastroenterology)  Extended Emergency Contact Information Primary Emergency Contact: [facility] Phone: [phone] Relation: Granddaughter  Code Status: Full Code Goals of Care: Advanced Directive information Advanced Directives 04/29/2018  Does Patient Have a Medical Advance Directive? No  Type of Advance Directive -  Does patient want to make changes to medical advance directive? -  Copy of [place] in Chart? -  Would patient like information on creating a medical advance directive? No - Patient declined  Pre-existing out of facility DNR order (yellow form or pink MOST form) -      Chief Complaint  Patient presents with  . New Admit To SNF    Admission    HPI: Patient is a 85 y.o. female seen today for admission to SNF for therapy. Patient was in the hospital from 03/14-03/17 for Mental status Changes and Hallucinations.with Hyponatremia Patient has a history of hypertension, hypothyroidism and chronic pancreatitis. Diabetes type 2, Hyperlipidemia and Vit B12 def, Environmental [place]   Her family brought her to the ED as she was having hallucinations.  She was found to have a sodium of 124.  Her HCTZ diuretic was discontinued.  Her CT scan was negative.  UA was negative.  Her mental status improved and she was discharged to SNF for therapy.  Patient is working with therapy but says she was still weak to walk.  She denied any more hallucinations.  No nausea vomiting or diarrhea.  Her mental status seems to be back to her baseline.  No fever or cough Patient lives by herself but has her daughter-in-law and granddaughter live lives next door.  She was driving short distances  and was independent in her ADLs.  She did use cane and walker sometimes at home    Past Medical History:  Diagnosis Date  . Arthritis   . Diabetes mellitus without complication ([place])    "borderline"  . Diverticulosis of colon   . High cholesterol   . Hypertension   . Hypothyroidism   . Pancreatitis    Past Surgical History:  Procedure Laterality Date  . ABDOMINAL HYSTERECTOMY     partial  . APPENDECTOMY    . bladder tac    . CHOLECYSTECTOMY  1999   cholelithiasis  . COLONOSCOPY  01/2007   Dr Olevia Perches: normal  . COLONOSCOPY  12/2000   Dr Deatra Ina- diverticulosis  . ESOPHAGOGASTRODUODENOSCOPY     Dr Damaris Hippo cannot remember  . EUS N/A 03/24/2012   Dr. Paulita Fujita: suspected passed CBD stone as cause of acute pancreatitis, suspective chronic pancreatitis.   Marland Kitchen EXCISION OF BREAST BIOPSY    . HIP PINNING,CANNULATED Left 01/15/2015   Procedure: CANNULATED HIP PINNING/LEFT;  Surgeon: Renette Butters, MD;  Location: [facility];  Service: Orthopedics;  Laterality: Left;  Marland Kitchen VEIN SURGERY      reports that she has never smoked. She has never used smokeless tobacco. She reports that she does not drink alcohol or use drugs. Social History   Socioeconomic History  . Marital status: Married    Spouse name: Not on file  . Number of children: 1  . Years of education: Not on file  . Highest education level: Not on file  Occupational History  . Occupation:  retired, tobacco field  Social Needs  . Financial resource strain: Not on file  . Food insecurity:    Worry: Not on file    Inability: Not on file  . Transportation needs:    Medical: Not on file    Non-medical: Not on file  Tobacco Use  . Smoking status: Never Smoker  . Smokeless tobacco: Never Used  Substance and Sexual Activity  . Alcohol use: No    Alcohol/week: 0.0 standard drinks  . Drug use: No  . Sexual activity: Not on file  Lifestyle  . Physical activity:    Days per week: Not on file    Minutes per session: Not on file  .  Stress: Not on file  Relationships  . Social connections:    Talks on phone: Not on file    Gets together: Not on file    Attends religious service: Not on file    Active member of club or organization: Not on file    Attends meetings of clubs or organizations: Not on file    Relationship status: Not on file  . Intimate partner violence:    Fear of current or ex partner: Not on file    Emotionally abused: Not on file    Physically abused: Not on file    Forced sexual activity: Not on file  Other Topics Concern  . Not on file  Social History Narrative   Lost 1 son age 67-?MI   1 living son          Functional Status Survey:    Family History  Problem Relation Age of Onset  . Diabetes Father   . Diabetes Mother   . Cancer Mother        ovarian  . Cancer Sister 62       breast  . Cancer Sister 37       bone    Health Maintenance  Topic Date Due  . FOOT EXAM  05/31/2018 (Originally 11/28/1941)  . HEMOGLOBIN A1C  05/31/2018 (Originally 07/27/2012)  . OPHTHALMOLOGY EXAM  05/31/2018 (Originally 11/28/1941)  . URINE MICROALBUMIN  05/31/2018 (Originally 11/28/1941)  . TETANUS/TDAP  03/02/2021  . INFLUENZA VACCINE  Completed  . DEXA SCAN  Completed  . PNA vac Low Risk Adult  Completed    Allergies  Allergen Reactions  . Alendronate     Unknown reaction  . Cephalexin Other (See Comments)    Unknown reaction Unknown reaction Unknown reaction  . Clindamycin Hcl Other (See Comments)  . Clindamycin/Lincomycin Hives  . Creon [Pancreatin] Hives  . Doxycycline Other (See Comments)    Unknown reaction Unknown reaction  . Florastor [Yeast] Hives  . Other Other (See Comments) and Hives  . Penicillins Other (See Comments)    Has patient had a PCN reaction causing immediate rash, facial/tongue/throat swelling, SOB or lightheadedness with hypotension: unknown Has patient had a PCN reaction causing severe rash involving mucus membranes or skin necrosis: unknown Has patient  had a PCN reaction that required hospitalization unknown Has patient had a PCN reaction occurring within the last 10 years:unknown If all of the above answers are "NO", then may proceed with Cephalosporin use.  Has patient had a PCN reaction causing immediate rash, facial/tongue/throat swelling, SOB or lightheadedness with hypotension: unknown Has patient had a PCN reaction causing severe rash involving mucus membranes or skin necrosis: unknown Has patient had a PCN reaction that required hospitalization unknown Has patient had a PCN reaction occurring within the last 10 years:unknown  If all of the above answers are "NO", then may proceed with Cephalosporin use.  Marland Kitchen Zenpep [Pancrelipase (Lip-Prot-Amyl)] Hives    Outpatient Encounter Medications as of 04/29/2018  Medication Sig  . amLODipine (NORVASC) 5 MG tablet Take 5 mg by mouth every morning.   Marland Kitchen aspirin EC 81 MG tablet Take 81 mg by mouth at bedtime.   . calcium carbonate (CALTRATE 600) 1500 (600 Ca) MG TABS tablet Take 1 tablet (1,500 mg total) by mouth 2 (two) times daily with a meal.  . carboxymethylcellulose (REFRESH PLUS) 0.5 % SOLN 1 drop 3 (three) times daily as needed.  . COSOPT PF 22.3-6.8 MG/ML SOLN Place 1 drop into both eyes 2 (two) times daily.   Marland Kitchen docusate sodium (COLACE) 100 MG capsule Take 100 mg by mouth 2 (two) times daily.  . fluticasone (FLONASE) 50 MCG/ACT nasal spray Place 1 spray into both nostrils daily.  Marland Kitchen latanoprost (XALATAN) 0.005 % ophthalmic solution Place 1 drop into both eyes at bedtime.  Marland Kitchen levocetirizine (XYZAL) 5 MG tablet Take 5 mg by mouth every evening.  Marland Kitchen levothyroxine (SYNTHROID, LEVOTHROID) 100 MCG tablet Take 1 tablet (100 mcg total) by mouth daily.  Marland Kitchen lisinopril (PRINIVIL,ZESTRIL) 2.5 MG tablet Take 2.5 mg by mouth daily.   Marland Kitchen loperamide (IMODIUM) 2 MG capsule Take 2 mg by mouth daily as needed for diarrhea or loose stools.   . metFORMIN (GLUCOPHAGE-XR) 500 MG 24 hr tablet Take 500 mg by mouth  daily with breakfast.   . Multiple Vitamin (MULTIVITAMIN WITH MINERALS) TABS tablet Take 1 tablet by mouth daily.  . NON FORMULARY Diet Type: NAS  . Omega-3 Fatty Acids (FISH OIL) 1000 MG CAPS Take 1 capsule by mouth daily.   . potassium chloride SA (K-DUR,KLOR-CON) 20 MEQ tablet Take 20 mEq by mouth 2 (two) times daily.   . ranitidine (ZANTAC) 150 MG tablet Take 150 mg by mouth 2 (two) times daily.  Marland Kitchen ZENPEP 20000-63000 units CPEP TAKE ONE CAPSULE BY MOUTH THREE TIMES DAILY BEFORE MEALS   No facility-administered encounter medications on file as of 04/29/2018.     Review of Systems  Constitutional: Positive for activity change.  HENT: Negative.   Respiratory: Negative.   Cardiovascular: Negative.   Gastrointestinal: Negative.   Genitourinary: Negative.   Musculoskeletal: Negative.   Skin: Negative.   Neurological: Positive for weakness.  Psychiatric/Behavioral: Negative.     Vitals:   04/29/18 0906  BP: 119/70  Pulse: 80  Resp: 20  Temp: 98.1 F (36.7 C)  Weight: 146 lb 9.6 oz (66.5 kg)  Height: 5\' 5"  (1.651 m)   Body mass index is 24.4 kg/m. Physical Exam Vitals signs reviewed.  Constitutional:      Appearance: Normal appearance.  HENT:     Head: Normocephalic.     Nose: Nose normal.     Mouth/Throat:     Mouth: Mucous membranes are moist.     Pharynx: Oropharynx is clear.  Eyes:     Pupils: Pupils are equal, round, and reactive to light.  Neck:     Musculoskeletal: Neck supple.  Cardiovascular:     Rate and Rhythm: Normal rate and regular rhythm.     Pulses: Normal pulses.     Heart sounds: Normal heart sounds.  Pulmonary:     Effort: Pulmonary effort is normal. No respiratory distress.     Breath sounds: Normal breath sounds. No wheezing or rales.  Abdominal:     General: Abdomen is flat. Bowel sounds are normal. There  is no distension.     Palpations: Abdomen is soft.     Tenderness: There is no abdominal tenderness. There is no guarding.   Musculoskeletal:        General: No swelling.  Skin:    General: Skin is warm and dry.  Neurological:     General: No focal deficit present.     Mental Status: She is alert.     Comments: Patient was oriented get confused about some things.  Psychiatric:        Mood and Affect: Mood normal.        Thought Content: Thought content normal.        Judgment: Judgment normal.     Labs reviewed: Basic Metabolic Panel: Recent Labs    04/25/18 0002 04/25/18 0639 04/26/18 0525  NA 124* 128* 137  K 3.4* 3.6 3.8  CL 94* 98 109  CO2 20* 22 21*  GLUCOSE 131* 107* 97  BUN 17 12 12   CREATININE 0.85 0.63 0.70  CALCIUM 9.0 8.4* 8.7*   Liver Function Tests: Recent Labs    04/25/18 0002  AST 27  ALT 18  ALKPHOS 106  BILITOT 0.5  PROT 6.9  ALBUMIN 4.1   No results for input(s): LIPASE, AMYLASE in the last 8760 hours. No results for input(s): AMMONIA in the last 8760 hours. CBC: Recent Labs    09/27/17 0929 04/25/18 0002 04/25/18 0639  WBC  --  9.6 5.5  NEUTROABS  --  7.2  --   HGB 12.6 12.2 11.1*  HCT 37.0 35.2* 32.6*  MCV  --  88.2 89.6  PLT  --  254 205   Cardiac Enzymes: Recent Labs    04/25/18 0002  TROPONINI <0.03   BNP: Invalid input(s): POCBNP Lab Results  Component Value Date   HGBA1C 6.3 (H) 01/27/2012   Lab Results  Component Value Date   TSH 4.595 (H) 04/25/2018   No results found for: VITAMINB12 No results found for: FOLATE No results found for: IRON, TIBC, FERRITIN  Imaging and Procedures obtained prior to SNF admission: No results found.  Assessment/Plan Hypertension  BP stable on Lisinopril and Lisinopril HCTZ was stopped due to Hyponatremia Will continue to monitor Controlled type 2 diabetes mellitus  Continue Accu checks Follows with Her PCP  Idiopathic chronic pancreatitis  Continue on Zenpep  Hyponatremia Thought to be the reason for her confusion and Hallucinations Repeat Sodium is 137  Repeat Bmp pending Auditory  hallucinations Now resolved Continue to monitor Hypokalemia Discontinue Potassium  Repeat BMP in 1 week  Hypothyroid On Synthroid Dose was changed iin hospital  Will repeat TSH in 8 weeks H/o Allergies On Flonase and Xyzal Discharge Planning Concern as patient lives by herself.  Discussed with the patient and discharge nurse.  We will see how she improves over the next 2 weeks.  Possible assisted living   Family/ staff Communication:   Labs/tests ordered: Total time spent in this patient care encounter was 45_ minutes; greater than 50% of the visit spent counseling patient, reviewing records , Labs and coordinating care for problems addressed at this encounter.

## 2018-04-30 ENCOUNTER — Encounter (HOSPITAL_COMMUNITY)
Admission: RE | Admit: 2018-04-30 | Discharge: 2018-04-30 | Disposition: A | Discharge status: Home / Self Care | Payer: PPO | Source: Skilled Nursing Facility | Attending: Adult Health | Admitting: Adult Health

## 2018-04-30 DIAGNOSIS — E871 Hypo-osmolality and hyponatremia: Secondary | ICD-10-CM | POA: Insufficient documentation

## 2018-04-30 DIAGNOSIS — M1990 Unspecified osteoarthritis, unspecified site: Secondary | ICD-10-CM | POA: Insufficient documentation

## 2018-04-30 DIAGNOSIS — I10 Essential (primary) hypertension: Secondary | ICD-10-CM | POA: Insufficient documentation

## 2018-04-30 DIAGNOSIS — K5790 Diverticulosis of intestine, part unspecified, without perforation or abscess without bleeding: Secondary | ICD-10-CM | POA: Insufficient documentation

## 2018-04-30 DIAGNOSIS — K861 Other chronic pancreatitis: Secondary | ICD-10-CM | POA: Insufficient documentation

## 2018-04-30 DIAGNOSIS — E119 Type 2 diabetes mellitus without complications: Secondary | ICD-10-CM | POA: Insufficient documentation

## 2018-04-30 LAB — COMPREHENSIVE METABOLIC PANEL
ALT: 17 U/L (ref 0–44)
ANION GAP: 7 (ref 5–15)
AST: 19 U/L (ref 15–41)
Albumin: 3.4 g/dL — ABNORMAL LOW (ref 3.5–5.0)
Alkaline Phosphatase: 92 U/L (ref 38–126)
BUN: 18 mg/dL (ref 8–23)
CO2: 23 mmol/L (ref 22–32)
Calcium: 9 mg/dL (ref 8.9–10.3)
Chloride: 107 mmol/L (ref 98–111)
Creatinine, Ser: 0.6 mg/dL (ref 0.44–1.00)
GFR calc Af Amer: >60 mL/min (ref >60)
GFR calc non Af Amer: >60 mL/min (ref >60)
Glucose, Bld: 94 mg/dL (ref 70–99)
POTASSIUM: 4.1 mmol/L (ref 3.5–5.1)
Sodium: 137 mmol/L (ref 135–145)
Total Bilirubin: 0.3 mg/dL (ref 0.3–1.2)
Total Protein: 6 g/dL — ABNORMAL LOW (ref 6.5–8.1)

## 2018-04-30 LAB — CBC
HCT: 37.2 % (ref 36.0–46.0)
Hemoglobin: 12 g/dL (ref 12.0–15.0)
MCH: 30.7 pg (ref 26.0–34.0)
MCHC: 32.3 g/dL (ref 30.0–36.0)
MCV: 95.1 fL (ref 80.0–100.0)
Platelets: 235 10*3/uL (ref 150–400)
RBC: 3.91 MIL/uL (ref 3.87–5.11)
RDW: 13 % (ref 11.5–15.5)
WBC: 6.9 10*3/uL (ref 4.0–10.5)
nRBC: 0 % (ref 0.0–0.2)

## 2018-05-06 ENCOUNTER — Encounter (HOSPITAL_COMMUNITY)
Admission: RE | Admit: 2018-05-06 | Discharge: 2018-05-06 | Disposition: A | Discharge status: Home / Self Care | Payer: PPO | Source: Skilled Nursing Facility | Attending: Internal Medicine | Admitting: Internal Medicine

## 2018-05-06 DIAGNOSIS — E871 Hypo-osmolality and hyponatremia: Secondary | ICD-10-CM | POA: Insufficient documentation

## 2018-05-06 DIAGNOSIS — K5790 Diverticulosis of intestine, part unspecified, without perforation or abscess without bleeding: Secondary | ICD-10-CM | POA: Insufficient documentation

## 2018-05-06 DIAGNOSIS — M1990 Unspecified osteoarthritis, unspecified site: Secondary | ICD-10-CM | POA: Insufficient documentation

## 2018-05-06 DIAGNOSIS — I10 Essential (primary) hypertension: Secondary | ICD-10-CM | POA: Insufficient documentation

## 2018-05-06 LAB — BASIC METABOLIC PANEL
Anion gap: 9 (ref 5–15)
BUN: 16 mg/dL (ref 8–23)
CO2: 24 mmol/L (ref 22–32)
Calcium: 8.9 mg/dL (ref 8.9–10.3)
Chloride: 106 mmol/L (ref 98–111)
Creatinine, Ser: 0.61 mg/dL (ref 0.44–1.00)
GFR calc Af Amer: >60 mL/min (ref >60)
GFR calc non Af Amer: >60 mL/min (ref >60)
Glucose, Bld: 100 mg/dL — ABNORMAL HIGH (ref 70–99)
Potassium: 3.5 mmol/L (ref 3.5–5.1)
Sodium: 139 mmol/L (ref 135–145)

## 2018-05-06 LAB — CBC WITH DIFFERENTIAL/PLATELET
Abs Immature Granulocytes: 0.01 10*3/uL (ref 0.00–0.07)
Basophils Absolute: 0 10*3/uL (ref 0.0–0.1)
Basophils Relative: 1 %
Eosinophils Absolute: 0.2 10*3/uL (ref 0.0–0.5)
Eosinophils Relative: 3 %
HCT: 38.6 % (ref 36.0–46.0)
Hemoglobin: 12.6 g/dL (ref 12.0–15.0)
Immature Granulocytes: 0 %
LYMPHS PCT: 21 %
Lymphs Abs: 1.3 10*3/uL (ref 0.7–4.0)
MCH: 30 pg (ref 26.0–34.0)
MCHC: 32.6 g/dL (ref 30.0–36.0)
MCV: 91.9 fL (ref 80.0–100.0)
MONO ABS: 0.5 10*3/uL (ref 0.1–1.0)
Monocytes Relative: 8 %
Neutro Abs: 4.1 10*3/uL (ref 1.7–7.7)
Neutrophils Relative %: 67 %
Platelets: 235 10*3/uL (ref 150–400)
RBC: 4.2 MIL/uL (ref 3.87–5.11)
RDW: 12.8 % (ref 11.5–15.5)
WBC: 6 10*3/uL (ref 4.0–10.5)
nRBC: 0 % (ref 0.0–0.2)

## 2018-05-07 ENCOUNTER — Non-Acute Institutional Stay (SKILLED_NURSING_FACILITY): Payer: PPO | Admitting: Internal Medicine

## 2018-05-07 ENCOUNTER — Encounter: Payer: Self-pay | Admitting: Internal Medicine

## 2018-05-07 DIAGNOSIS — R509 Fever, unspecified: Secondary | ICD-10-CM

## 2018-05-07 DIAGNOSIS — I10 Essential (primary) hypertension: Secondary | ICD-10-CM

## 2018-05-07 DIAGNOSIS — E871 Hypo-osmolality and hyponatremia: Secondary | ICD-10-CM | POA: Diagnosis not present

## 2018-05-07 DIAGNOSIS — E1159 Type 2 diabetes mellitus with other circulatory complications: Secondary | ICD-10-CM | POA: Diagnosis not present

## 2018-05-07 DIAGNOSIS — M7989 Other specified soft tissue disorders: Secondary | ICD-10-CM | POA: Insufficient documentation

## 2018-05-07 DIAGNOSIS — I152 Hypertension secondary to endocrine disorders: Secondary | ICD-10-CM

## 2018-05-07 NOTE — Progress Notes (Signed)
Location:  [place] Room Number: [place] of Service:  SNF [phone]) Provider:  Veleta Miners, MD  Aletha Halim., PA-C  Patient Care Team: Aletha Halim PA-C as PCP - General (Family Medicine) Danie Binder, MD as Attending Physician (Gastroenterology)  Extended Emergency Contact Information Primary Emergency Contact: [facility] Phone: [phone] Relation: Granddaughter  Code Status:  Full Code Goals of care: Advanced Directive information Advanced Directives 05/07/2018  Does Patient Have a Medical Advance Directive? No  Type of Advance Directive -  Does patient want to make changes to medical advance directive? -  Copy of [place] in Chart? -  Would patient like information on creating a medical advance directive? No - Patient declined  Pre-existing out of facility DNR order (yellow form or pink MOST form) -     Chief Complaint  Patient presents with   Acute Visit    Fever    HPI:  Pt is a 85 y.o. female seen today for an acute visit for Low grade temp of 100.3  Patient was in the hospital from 03/14-03/17 for Mental status Changes and Hallucinations.with Hyponatremia Patient has a history of hypertension, hypothyroidism and chronic pancreatitis. Diabetes type 2, Hyperlipidemia and Vit B12 def, Environmental Allergies  Her family brought her to the ED as she was having hallucinations.  She was found to have a sodium of 124.  Her HCTZ diuretic was discontinued.  Her CT scan was negative.  UA was negative.  Her mental status improved and she was discharged to SNF for therapy. Patient was noticed to have one-time temp of 100.3.  She was put in isolation and I went to evaluate her. Patient denies any coughing shortness of breath chest pain dysuria abdominal pain diarrhea. Her only complaint today was lower extremity edema.  She said that they have been getting worse for the past few days.  Past Medical History:   Diagnosis Date   Arthritis    Diabetes mellitus without complication ([place])    "borderline"   Diverticulosis of colon    High cholesterol    Hypertension    Hypothyroidism    Pancreatitis    Past Surgical History:  Procedure Laterality Date   ABDOMINAL HYSTERECTOMY     partial   APPENDECTOMY     bladder tac     CHOLECYSTECTOMY  1999   cholelithiasis   COLONOSCOPY  01/2007   Dr Olevia Perches: normal   COLONOSCOPY  12/2000   Dr Deatra Ina- diverticulosis   ESOPHAGOGASTRODUODENOSCOPY     Dr Damaris Hippo cannot remember   EUS N/A 03/24/2012   Dr. Paulita Fujita: suspected passed CBD stone as cause of acute pancreatitis, suspective chronic pancreatitis.    EXCISION OF BREAST BIOPSY     HIP PINNING,CANNULATED Left 01/15/2015   Procedure: CANNULATED HIP PINNING/LEFT;  Surgeon: Renette Butters, MD;  Location: [facility];  Service: Orthopedics;  Laterality: Left;   VEIN SURGERY      Allergies  Allergen Reactions   Alendronate     Unknown reaction   Cephalexin Other (See Comments)    Unknown reaction Unknown reaction Unknown reaction   Clindamycin Hcl Other (See Comments)   Clindamycin/Lincomycin Hives   Creon [Pancreatin] Hives   Doxycycline Other (See Comments)    Unknown reaction Unknown reaction   Florastor [Yeast] Hives   Other Other (See Comments) and Hives   Penicillins Other (See Comments)    Has patient had a PCN reaction causing immediate rash, facial/tongue/throat swelling, SOB  or lightheadedness with hypotension: unknown Has patient had a PCN reaction causing severe rash involving mucus membranes or skin necrosis: unknown Has patient had a PCN reaction that required hospitalization unknown Has patient had a PCN reaction occurring within the last 10 years:unknown If all of the above answers are "NO", then may proceed with Cephalosporin use.  Has patient had a PCN reaction causing immediate rash, facial/tongue/throat swelling, SOB or lightheadedness with  hypotension: unknown Has patient had a PCN reaction causing severe rash involving mucus membranes or skin necrosis: unknown Has patient had a PCN reaction that required hospitalization unknown Has patient had a PCN reaction occurring within the last 10 years:unknown If all of the above answers are "NO", then may proceed with Cephalosporin use.   Zenpep [Pancrelipase (Lip-Prot-Amyl)] Hives    Outpatient Encounter Medications as of 05/07/2018  Medication Sig   amLODipine (NORVASC) 5 MG tablet Take 5 mg by mouth every morning.    aspirin EC 81 MG tablet Take 81 mg by mouth at bedtime.    calcium carbonate (CALTRATE 600) 1500 (600 Ca) MG TABS tablet Take 1 tablet (1,500 mg total) by mouth 2 (two) times daily with a meal.   carboxymethylcellulose (REFRESH PLUS) 0.5 % SOLN 1 drop 3 (three) times daily as needed.   COSOPT PF 22.3-6.8 MG/ML SOLN Place 1 drop into both eyes 2 (two) times daily.    docusate sodium (COLACE) 100 MG capsule Take 100 mg by mouth 2 (two) times daily.   fluticasone (FLONASE) 50 MCG/ACT nasal spray Place 1 spray into both nostrils daily.   latanoprost (XALATAN) 0.005 % ophthalmic solution Place 1 drop into both eyes at bedtime.   levocetirizine (XYZAL) 5 MG tablet Take 5 mg by mouth every evening.   levothyroxine (SYNTHROID, LEVOTHROID) 100 MCG tablet Take 1 tablet (100 mcg total) by mouth daily.   lisinopril (PRINIVIL,ZESTRIL) 2.5 MG tablet Take 2.5 mg by mouth daily.    loperamide (IMODIUM) 2 MG capsule Take 2 mg by mouth daily as needed for diarrhea or loose stools.    metFORMIN (GLUCOPHAGE-XR) 500 MG 24 hr tablet Take 500 mg by mouth daily with breakfast.    Multiple Vitamin (MULTIVITAMIN WITH MINERALS) TABS tablet Take 1 tablet by mouth daily.   NON FORMULARY Diet Type: NAS   Omega-3 Fatty Acids (FISH OIL) 1000 MG CAPS Take 1 capsule by mouth daily.    ranitidine (ZANTAC) 150 MG tablet Take 150 mg by mouth 2 (two) times daily.   ZENPEP 20000-63000  units CPEP TAKE ONE CAPSULE BY MOUTH THREE TIMES DAILY BEFORE MEALS   [DISCONTINUED] potassium chloride SA (K-DUR,KLOR-CON) 20 MEQ tablet Take 20 mEq by mouth 2 (two) times daily.    No facility-administered encounter medications on file as of 05/07/2018.     Review of Systems  Constitutional: Negative.   HENT: Negative.   Respiratory: Negative.   Cardiovascular: Positive for leg swelling.  Gastrointestinal: Positive for constipation.  Genitourinary: Negative.   Musculoskeletal: Negative.   Skin: Negative.   Neurological: Negative.   Psychiatric/Behavioral: Negative.     Immunization History  Administered Date(s) Administered   Influenza, High Dose Seasonal PF 11/29/2015, 12/11/2016, 11/19/2017   Influenza-Unspecified 12/13/2008, 11/28/2010, 12/11/2011, 12/05/2013, 12/11/2014   Pneumococcal Conjugate-13 12/11/2014   Pneumococcal Polysaccharide-23 03/03/2011   Tdap 03/03/2011   Zoster 02/10/2010   Pertinent  Health Maintenance Due  Topic Date Due   FOOT EXAM  05/31/2018 (Originally 11/28/1941)   HEMOGLOBIN A1C  05/31/2018 (Originally 07/27/2012)   OPHTHALMOLOGY EXAM  05/31/2018 (  Originally 11/28/1941)   URINE MICROALBUMIN  05/31/2018 (Originally 11/28/1941)   INFLUENZA VACCINE  Completed   DEXA SCAN  Completed   PNA vac Low Risk Adult  Completed   No flowsheet data found. Functional Status Survey:    Vitals:   05/07/18 1311  BP: 129/75  Pulse: 75  Resp: 20  Temp: 100.3 F (37.9 C)  Weight: 151 lb 12.8 oz (68.9 kg)  Height: 5\' 2"  (1.575 m)   Body mass index is 27.76 kg/m. Physical Exam Vitals signs reviewed.  Constitutional:      Appearance: Normal appearance.  HENT:     Head: Normocephalic.     Nose: Nose normal.     Mouth/Throat:     Mouth: Mucous membranes are moist.     Pharynx: Oropharynx is clear.  Neck:     Musculoskeletal: Neck supple.  Cardiovascular:     Rate and Rhythm: Normal rate and regular rhythm.     Pulses: Normal pulses.       Heart sounds: No murmur.  Pulmonary:     Effort: Pulmonary effort is normal. No respiratory distress.     Breath sounds: Normal breath sounds. No wheezing or rales.  Abdominal:     General: Abdomen is flat. Bowel sounds are normal. There is no distension.     Palpations: Abdomen is soft.     Tenderness: There is no abdominal tenderness. There is no guarding.  Musculoskeletal:     Comments: Bilateral LE edema  Skin:    General: Skin is warm and dry.  Neurological:     General: No focal deficit present.     Mental Status: She is alert.  Psychiatric:        Mood and Affect: Mood normal.        Thought Content: Thought content normal.     Labs reviewed: Recent Labs    04/26/18 0525 04/30/18 0500 05/06/18 0759  NA 137 137 139  K 3.8 4.1 3.5  CL 109 107 106  CO2 21* 23 24  GLUCOSE 97 94 100*  BUN 12 18 16   CREATININE 0.70 0.60 0.61  CALCIUM 8.7* 9.0 8.9   Recent Labs    04/25/18 0002 04/30/18 0500  AST 27 19  ALT 18 17  ALKPHOS 106 92  BILITOT 0.5 0.3  PROT 6.9 6.0*  ALBUMIN 4.1 3.4*   Recent Labs    04/25/18 0002 04/25/18 0639 04/30/18 0500 05/06/18 0759  WBC 9.6 5.5 6.9 6.0  NEUTROABS 7.2  --   --  4.1  HGB 12.2 11.1* 12.0 12.6  HCT 35.2* 32.6* 37.2 38.6  MCV 88.2 89.6 95.1 91.9  PLT 254 205 235 235   Lab Results  Component Value Date   TSH 4.595 (H) 04/25/2018   Lab Results  Component Value Date   HGBA1C 6.3 (H) 01/27/2012   Lab Results  Component Value Date   CHOL 130 01/27/2012   HDL 49 01/27/2012   LDLCALC 65 01/27/2012   TRIG 81 01/27/2012   CHOLHDL 2.7 01/27/2012    Significant Diagnostic Results in last 30 days:  Ct Head Wo Contrast  Result Date: 04/25/2018 CLINICAL DATA:  Altered LOC EXAM: CT HEAD WITHOUT CONTRAST TECHNIQUE: Contiguous axial images were obtained from the base of the skull through the vertex without intravenous contrast. COMPARISON:  CT brain 10/14/2007 FINDINGS: Brain: No acute territorial infarction, hemorrhage  or intracranial mass. Chronic appearing small infarct in the left cerebellum. Mild atrophy. Mild small vessel ischemic changes of the white  matter. The ventricles are nonenlarged. Vascular: No hyperdense vessels.  Carotid vascular calcification Skull: Fluid in the left mastoid air cells.  No skull fracture Sinuses/Orbits: Mucosal thickening and fluid in the left maxillary sinus. Other: None IMPRESSION: 1. No CT evidence for acute intracranial abnormality. 2. Atrophy and small vessel ischemic changes of the white matter. 3. Fluid in the left mastoid air cells. Electronically Signed   By: Donavan Foil M.D.   On: 04/25/2018 00:40    Assessment/Plan Low Grade Fever Patient had one spike of Low grade temp. Since then she has had no Fever Will  Discontinue isolation No Work up right now. She is Asympromatic Will continue to monitor her Vitals LE edema Patient was taken off her HCTZ due to hyponatremia We will start her on Lasix 20 mg p.o. daily Repeat BMP on Monday to follow her sodium  Other Issues Hypertension  BP stable on Lisinopril and Amlodipine HCTZ was stopped due to Hyponatremia Will continue to monitor Controlled type 2 diabetes mellitus  BS are mostly running Less then 100  Idiopathic chronic pancreatitis  Continue on Zenpep  Hyponatremia Thought to be the reason for her confusion and Hallucinations Repeat Sodium is 139   Auditory hallucinations Now resolved Continue to monitor Hypothyroid On Synthroid Dose was changed iin hospital  Will repeat TSH in 8 weeks H/o Allergies On Flonase and Xyzal Discharge Planning Concern as patient lives by herself.  Was Discussed with the patient and discharge nurse.  We will see how she improves over the next 2 weeks.  Possible assisted living    Family/ staff Communication:   Labs/tests ordered:

## 2018-05-10 ENCOUNTER — Non-Acute Institutional Stay (SKILLED_NURSING_FACILITY): Payer: PPO | Admitting: Internal Medicine

## 2018-05-10 ENCOUNTER — Encounter: Payer: Self-pay | Admitting: Internal Medicine

## 2018-05-10 ENCOUNTER — Encounter (HOSPITAL_COMMUNITY)
Admission: RE | Admit: 2018-05-10 | Discharge: 2018-05-10 | Disposition: A | Discharge status: Home / Self Care | Payer: PPO | Source: Skilled Nursing Facility | Attending: *Deleted | Admitting: *Deleted

## 2018-05-10 DIAGNOSIS — E871 Hypo-osmolality and hyponatremia: Secondary | ICD-10-CM | POA: Diagnosis not present

## 2018-05-10 DIAGNOSIS — M7989 Other specified soft tissue disorders: Secondary | ICD-10-CM

## 2018-05-10 LAB — BASIC METABOLIC PANEL
Anion gap: 8 (ref 5–15)
BUN: 22 mg/dL (ref 8–23)
CO2: 24 mmol/L (ref 22–32)
Calcium: 8.7 mg/dL — ABNORMAL LOW (ref 8.9–10.3)
Chloride: 107 mmol/L (ref 98–111)
Creatinine, Ser: 0.64 mg/dL (ref 0.44–1.00)
GFR calc Af Amer: >60 mL/min (ref >60)
GFR calc non Af Amer: >60 mL/min (ref >60)
Glucose, Bld: 92 mg/dL (ref 70–99)
Potassium: 3.4 mmol/L — ABNORMAL LOW (ref 3.5–5.1)
SODIUM: 139 mmol/L (ref 135–145)

## 2018-05-10 NOTE — Progress Notes (Signed)
Location:  [facility] Room Number: [place] of Service:  SNF [id number]) Provider: Veleta Miners, MD  Aletha Halim., PA-C  Patient Care Team: Aletha Halim PA-C as PCP - General (Family Medicine) Danie Binder, MD as Attending Physician (Gastroenterology)  Extended Emergency Contact Information Primary Emergency Contact: [facility] Phone: [phone] Relation: Granddaughter  Code Status:  Full Code Goals of care: Advanced Directive information Advanced Directives 05/10/2018  Does Patient Have a Medical Advance Directive? No  Type of Advance Directive -  Does patient want to make changes to medical advance directive? -  Copy of [place] in Chart? -  Would patient like information on creating a medical advance directive? No - Patient declined  Pre-existing out of facility DNR order (yellow form or pink MOST form) -     Chief Complaint  Patient presents with  . Acute Visit    Follow up Fever and LE edema    HPI:  Pt is a 85 y.o. female seen today for an acute visit for follow up of her Swelling. And Fever Patient is admitted to SNF for therapy.Patientwas in the hospitalfrom 03/14-03/17 for Mental status Changes and Hallucinations.with Hyponatremia Patient has a history of hypertension, hypothyroidism and chronic pancreatitis.Diabetes type 2, Hyperlipidemia and Vit B12 def, Environmental Allergies   She was seen today to follow up her edema and Fever Patient had 1 spike of Temp few days ago but since then she has been afebrile. And completely asymptomatic She also developed LE edema and was started on Lasix as her HCTZ was discontinued in hospital due to Hyponatremia Patient is doing well with that.  She had a repeat BMP done and her sodium is stable.  Her weight is stable at 151 right now and has not changed. Patient continues to deny any shortness of breath or cough. She is working with therapy and is  actually able to walk 100 feet with walker.      Past Medical History:  Diagnosis Date  . Arthritis   . Diabetes mellitus without complication ([place])    "borderline"  . Diverticulosis of colon   . High cholesterol   . Hypertension   . Hypothyroidism   . Pancreatitis    Past Surgical History:  Procedure Laterality Date  . ABDOMINAL HYSTERECTOMY     partial  . APPENDECTOMY    . bladder tac    . CHOLECYSTECTOMY  1999   cholelithiasis  . COLONOSCOPY  01/2007   Dr Olevia Perches: normal  . COLONOSCOPY  12/2000   Dr Deatra Ina- diverticulosis  . ESOPHAGOGASTRODUODENOSCOPY     Dr Damaris Hippo cannot remember  . EUS N/A 03/24/2012   Dr. Paulita Fujita: suspected passed CBD stone as cause of acute pancreatitis, suspective chronic pancreatitis.   Marland Kitchen EXCISION OF BREAST BIOPSY    . HIP PINNING,CANNULATED Left 01/15/2015   Procedure: CANNULATED HIP PINNING/LEFT;  Surgeon: Renette Butters, MD;  Location: [facility];  Service: Orthopedics;  Laterality: Left;  Marland Kitchen VEIN SURGERY      Allergies  Allergen Reactions  . Alendronate     Unknown reaction  . Cephalexin Other (See Comments)    Unknown reaction Unknown reaction Unknown reaction  . Clindamycin Hcl Other (See Comments)  . Clindamycin/Lincomycin Hives  . Creon [Pancreatin] Hives  . Doxycycline Other (See Comments)    Unknown reaction Unknown reaction  . Florastor [Yeast] Hives  . Other Other (See Comments) and Hives  . Penicillins Other (See Comments)  Has patient had a PCN reaction causing immediate rash, facial/tongue/throat swelling, SOB or lightheadedness with hypotension: unknown Has patient had a PCN reaction causing severe rash involving mucus membranes or skin necrosis: unknown Has patient had a PCN reaction that required hospitalization unknown Has patient had a PCN reaction occurring within the last 10 years:unknown If all of the above answers are "NO", then may proceed with Cephalosporin use.  Has patient had a PCN reaction causing  immediate rash, facial/tongue/throat swelling, SOB or lightheadedness with hypotension: unknown Has patient had a PCN reaction causing severe rash involving mucus membranes or skin necrosis: unknown Has patient had a PCN reaction that required hospitalization unknown Has patient had a PCN reaction occurring within the last 10 years:unknown If all of the above answers are "NO", then may proceed with Cephalosporin use.  Marland Kitchen Zenpep [Pancrelipase (Lip-Prot-Amyl)] Hives    Outpatient Encounter Medications as of 05/10/2018  Medication Sig  . amLODipine (NORVASC) 5 MG tablet Take 5 mg by mouth every morning.   Marland Kitchen aspirin EC 81 MG tablet Take 81 mg by mouth at bedtime.   . calcium carbonate (CALTRATE 600) 1500 (600 Ca) MG TABS tablet Take 1 tablet (1,500 mg total) by mouth 2 (two) times daily with a meal.  . carboxymethylcellulose (REFRESH PLUS) 0.5 % SOLN 1 drop 3 (three) times daily as needed.  . COSOPT PF 22.3-6.8 MG/ML SOLN Place 1 drop into both eyes 2 (two) times daily.   Marland Kitchen docusate sodium (COLACE) 100 MG capsule Take 100 mg by mouth 2 (two) times daily.  . fluticasone (FLONASE) 50 MCG/ACT nasal spray Place 1 spray into both nostrils daily.  . furosemide (LASIX) 20 MG tablet Take 20 mg by mouth daily.  Marland Kitchen latanoprost (XALATAN) 0.005 % ophthalmic solution Place 1 drop into both eyes at bedtime.  Marland Kitchen levocetirizine (XYZAL) 5 MG tablet Take 5 mg by mouth every evening.  Marland Kitchen levothyroxine (SYNTHROID, LEVOTHROID) 100 MCG tablet Take 1 tablet (100 mcg total) by mouth daily.  Marland Kitchen lisinopril (PRINIVIL,ZESTRIL) 2.5 MG tablet Take 2.5 mg by mouth daily.   Marland Kitchen loperamide (IMODIUM) 2 MG capsule Take 2 mg by mouth daily as needed for diarrhea or loose stools.   . metFORMIN (GLUCOPHAGE-XR) 500 MG 24 hr tablet Take 500 mg by mouth daily with breakfast.   . Multiple Vitamin (MULTIVITAMIN WITH MINERALS) TABS tablet Take 1 tablet by mouth daily.  . NON FORMULARY Diet Type: NAS  . Omega-3 Fatty Acids (FISH OIL) 1000 MG CAPS  Take 1 capsule by mouth daily.   . ranitidine (ZANTAC) 150 MG tablet Take 150 mg by mouth 2 (two) times daily.  Marland Kitchen ZENPEP 20000-63000 units CPEP TAKE ONE CAPSULE BY MOUTH THREE TIMES DAILY BEFORE MEALS   No facility-administered encounter medications on file as of 05/10/2018.     Review of Systems  Constitutional: Negative.   HENT: Negative.   Respiratory: Negative.   Cardiovascular: Positive for leg swelling.  Gastrointestinal: Positive for constipation.  Genitourinary: Negative.   Musculoskeletal: Negative.   Skin: Negative.   Neurological: Negative.   Psychiatric/Behavioral: Negative.     Immunization History  Administered Date(s) Administered  . Influenza, High Dose Seasonal PF 11/29/2015, 12/11/2016, 11/19/2017  . Influenza-Unspecified 12/13/2008, 11/28/2010, 12/11/2011, 12/05/2013, 12/11/2014  . Pneumococcal Conjugate-13 12/11/2014  . Pneumococcal Polysaccharide-23 03/03/2011  . Tdap 03/03/2011  . Zoster 02/10/2010   Pertinent  Health Maintenance Due  Topic Date Due  . FOOT EXAM  05/31/2018 (Originally 11/28/1941)  . HEMOGLOBIN A1C  05/31/2018 (Originally 07/27/2012)  .  OPHTHALMOLOGY EXAM  05/31/2018 (Originally 11/28/1941)  . URINE MICROALBUMIN  05/31/2018 (Originally 11/28/1941)  . INFLUENZA VACCINE  Completed  . DEXA SCAN  Completed  . PNA vac Low Risk Adult  Completed   No flowsheet data found. Functional Status Survey:    Vitals:   05/10/18 0918  BP: (!) 150/78  Pulse: 79  Resp: (!) 24  Temp: (!) 97 F (36.1 C)  Weight: 151 lb 12.8 oz (68.9 kg)  Height: 5\' 2"  (1.575 m)   Body mass index is 27.76 kg/m. Physical Exam Vitals signs reviewed.  Constitutional:      Appearance: Normal appearance.  HENT:     Head: Normocephalic.     Nose: Nose normal.     Mouth/Throat:     Mouth: Mucous membranes are moist.     Pharynx: Oropharynx is clear.  Neck:     Musculoskeletal: Neck supple.  Cardiovascular:     Rate and Rhythm: Normal rate and regular rhythm.      Pulses: Normal pulses.     Heart sounds: No murmur.  Pulmonary:     Effort: Pulmonary effort is normal. No respiratory distress.     Breath sounds: Normal breath sounds. No wheezing or rales.  Abdominal:     General: Abdomen is flat. Bowel sounds are normal. There is no distension.     Palpations: Abdomen is soft.     Tenderness: There is no abdominal tenderness. There is no guarding.  Musculoskeletal:     Comments: Bilateral LE edema slightly better  Skin:    General: Skin is warm and dry.  Neurological:     General: No focal deficit present.     Mental Status: She is alert.  Psychiatric:        Mood and Affect: Mood normal.        Thought Content: Thought content normal.     Labs reviewed: Recent Labs    04/30/18 0500 05/06/18 0759 05/10/18 0700  NA 137 139 139  K 4.1 3.5 3.4*  CL 107 106 107  CO2 23 24 24   GLUCOSE 94 100* 92  BUN 18 16 22   CREATININE 0.60 0.61 0.64  CALCIUM 9.0 8.9 8.7*   Recent Labs    04/25/18 0002 04/30/18 0500  AST 27 19  ALT 18 17  ALKPHOS 106 92  BILITOT 0.5 0.3  PROT 6.9 6.0*  ALBUMIN 4.1 3.4*   Recent Labs    04/25/18 0002 04/25/18 0639 04/30/18 0500 05/06/18 0759  WBC 9.6 5.5 6.9 6.0  NEUTROABS 7.2  --   --  4.1  HGB 12.2 11.1* 12.0 12.6  HCT 35.2* 32.6* 37.2 38.6  MCV 88.2 89.6 95.1 91.9  PLT 254 205 235 235   Lab Results  Component Value Date   TSH 4.595 (H) 04/25/2018   Lab Results  Component Value Date   HGBA1C 6.3 (H) 01/27/2012   Lab Results  Component Value Date   CHOL 130 01/27/2012   HDL 49 01/27/2012   LDLCALC 65 01/27/2012   TRIG 81 01/27/2012   CHOLHDL 2.7 01/27/2012    Significant Diagnostic Results in last 30 days:  Ct Head Wo Contrast  Result Date: 04/25/2018 CLINICAL DATA:  Altered LOC EXAM: CT HEAD WITHOUT CONTRAST TECHNIQUE: Contiguous axial images were obtained from the base of the skull through the vertex without intravenous contrast. COMPARISON:  CT brain 10/14/2007 FINDINGS: Brain:  No acute territorial infarction, hemorrhage or intracranial mass. Chronic appearing small infarct in the left cerebellum. Mild atrophy.  Mild small vessel ischemic changes of the white matter. The ventricles are nonenlarged. Vascular: No hyperdense vessels.  Carotid vascular calcification Skull: Fluid in the left mastoid air cells.  No skull fracture Sinuses/Orbits: Mucosal thickening and fluid in the left maxillary sinus. Other: None IMPRESSION: 1. No CT evidence for acute intracranial abnormality. 2. Atrophy and small vessel ischemic changes of the white matter. 3. Fluid in the left mastoid air cells. Electronically Signed   By: Donavan Foil M.D.   On: 04/25/2018 00:40    Assessment/Plan Low Grade Fever No spiking of fever for past 48 hours No other symptoms Patient doing well LE edema Patient was taken off her HCTZ due to hyponatremia in hospital Doing well on lasix Repeat BMP in few days before her discharge Potassium Supplemented    Family/ staff Communication:   Labs/tests ordered:  BMP Total time spent in this patient care encounter was 25_ minutes; greater than 50% of the visit spent counseling patient, reviewing records , Labs and coordinating care for problems addressed at this encounter.

## 2018-05-12 ENCOUNTER — Other Ambulatory Visit: Payer: Self-pay | Admitting: *Deleted

## 2018-05-12 NOTE — Patient Outreach (Signed)
[facility]) Care Management  05/12/2018  NALIA HONEYCUTT 04/26/1931 [id number]   Collaboration with [place] and Marianna Fuss at [facility].  Patient to discharge home 05/14/18 She has family that live all around her. They are very supportive.  Also, they are working with Alvis Lemmings for extra caregiver support. No [facility] care management needs anticipated at this time. Will sign off. Royetta Crochet. Laymond Purser, MSN, AGNP-C, [place] [phone]) Business Cell  [phone]) Toll Free Office

## 2018-05-13 ENCOUNTER — Encounter (HOSPITAL_COMMUNITY)
Admission: RE | Admit: 2018-05-13 | Discharge: 2018-05-13 | Disposition: A | Discharge status: Home / Self Care | Payer: PPO | Source: Skilled Nursing Facility | Attending: Internal Medicine | Admitting: Internal Medicine

## 2018-05-13 ENCOUNTER — Non-Acute Institutional Stay (SKILLED_NURSING_FACILITY): Payer: PPO | Admitting: Internal Medicine

## 2018-05-13 ENCOUNTER — Encounter: Payer: Self-pay | Admitting: Internal Medicine

## 2018-05-13 DIAGNOSIS — H401134 Primary open-angle glaucoma, bilateral, indeterminate stage: Secondary | ICD-10-CM | POA: Insufficient documentation

## 2018-05-13 DIAGNOSIS — E1159 Type 2 diabetes mellitus with other circulatory complications: Secondary | ICD-10-CM | POA: Diagnosis not present

## 2018-05-13 DIAGNOSIS — E1169 Type 2 diabetes mellitus with other specified complication: Secondary | ICD-10-CM

## 2018-05-13 DIAGNOSIS — E119 Type 2 diabetes mellitus without complications: Secondary | ICD-10-CM

## 2018-05-13 DIAGNOSIS — J3089 Other allergic rhinitis: Secondary | ICD-10-CM | POA: Insufficient documentation

## 2018-05-13 DIAGNOSIS — K7689 Other specified diseases of liver: Secondary | ICD-10-CM | POA: Diagnosis not present

## 2018-05-13 DIAGNOSIS — E785 Hyperlipidemia, unspecified: Secondary | ICD-10-CM

## 2018-05-13 DIAGNOSIS — I10 Essential (primary) hypertension: Secondary | ICD-10-CM

## 2018-05-13 DIAGNOSIS — E871 Hypo-osmolality and hyponatremia: Secondary | ICD-10-CM | POA: Insufficient documentation

## 2018-05-13 DIAGNOSIS — E039 Hypothyroidism, unspecified: Secondary | ICD-10-CM

## 2018-05-13 DIAGNOSIS — K8681 Exocrine pancreatic insufficiency: Secondary | ICD-10-CM | POA: Insufficient documentation

## 2018-05-13 DIAGNOSIS — K219 Gastro-esophageal reflux disease without esophagitis: Secondary | ICD-10-CM | POA: Insufficient documentation

## 2018-05-13 LAB — BASIC METABOLIC PANEL
Anion gap: 10 (ref 5–15)
BUN: 20 mg/dL (ref 8–23)
CO2: 25 mmol/L (ref 22–32)
Calcium: 9.1 mg/dL (ref 8.9–10.3)
Chloride: 106 mmol/L (ref 98–111)
Creatinine, Ser: 0.67 mg/dL (ref 0.44–1.00)
GFR calc Af Amer: >60 mL/min (ref >60)
GFR calc non Af Amer: >60 mL/min (ref >60)
Glucose, Bld: 100 mg/dL — ABNORMAL HIGH (ref 70–99)
Potassium: 3.2 mmol/L — ABNORMAL LOW (ref 3.5–5.1)
Sodium: 141 mmol/L (ref 135–145)

## 2018-05-13 NOTE — Progress Notes (Signed)
Location:  [place] Room Number: [place] of Service:  [phone])  Provider: Veleta Miners, MD  PCP: Aletha Halim., PA-C Patient Care Team: Aletha Halim., PA-C as PCP - General (Family Medicine) Danie Binder, MD as Attending Physician (Gastroenterology)  Extended Emergency Contact Information Primary Emergency Contact: [facility] Phone: [phone] Relation: Granddaughter  Code Status: Full Code Goals of care:  Advanced Directive information Advanced Directives 05/13/2018  Does Patient Have a Medical Advance Directive? No  Type of Advance Directive -  Does patient want to make changes to medical advance directive? -  Copy of [place] in Chart? -  Would patient like information on creating a medical advance directive? No - Patient declined  Pre-existing out of facility DNR order (yellow form or pink MOST form) -     Allergies  Allergen Reactions  . Alendronate     Unknown reaction  . Cephalexin Other (See Comments)    Unknown reaction Unknown reaction Unknown reaction  . Clindamycin Hcl Other (See Comments)  . Clindamycin/Lincomycin Hives  . Creon [Pancreatin] Hives  . Doxycycline Other (See Comments)    Unknown reaction Unknown reaction  . Florastor [Yeast] Hives  . Other Other (See Comments) and Hives  . Penicillins Other (See Comments)    Has patient had a PCN reaction causing immediate rash, facial/tongue/throat swelling, SOB or lightheadedness with hypotension: unknown Has patient had a PCN reaction causing severe rash involving mucus membranes or skin necrosis: unknown Has patient had a PCN reaction that required hospitalization unknown Has patient had a PCN reaction occurring within the last 10 years:unknown If all of the above answers are "NO", then may proceed with Cephalosporin use.  Has patient had a PCN reaction causing immediate rash, facial/tongue/throat swelling, SOB or lightheadedness  with hypotension: unknown Has patient had a PCN reaction causing severe rash involving mucus membranes or skin necrosis: unknown Has patient had a PCN reaction that required hospitalization unknown Has patient had a PCN reaction occurring within the last 10 years:unknown If all of the above answers are "NO", then may proceed with Cephalosporin use.  Marland Kitchen Zenpep [Pancrelipase (Lip-Prot-Amyl)] Hives    Chief Complaint  Patient presents with  . Discharge Note    Discharging to home on 05/14/2018    HPI:  85 y.o. female  Seen today for discharge home tomorrow  Patient was admitted in the hospital from 03/14-03/17 for Mental status Changes and Hallucinations.with Hyponatremia Patient has a history of hypertension, hypothyroidism and chronic pancreatitis. Diabetes type 2, Hyperlipidemia and Vit B12 def, Environmental [place]   Her family brought her to the ED as she was having hallucinations.  She was found to 1 of her family member stay with her a sodium of 124.  Her HCTZ diuretic was discontinued.  Her CT scan was negative.  UA was negative.  Her mental status improved and she was discharged to SNF for therapy Facility patient worked with therapy and is now able to walk 25 to 50 feet with a walker.  And is independent in her transfers. She did develop lower extremity edema and we started her on Lasix 20 mg.  Her sodium was watched very closely due to her history of hyponatremia on diuretics and it  stayed stable. Blood pressure was stable She is going home with help of her family.  She does live by herself but 1 of her family members agree to stay with her     Past Medical  History:  Diagnosis Date  . Arthritis   . Diabetes mellitus without complication ([place])    "borderline"  . Diverticulosis of colon   . High cholesterol   . Hypertension   . Hypothyroidism   . Pancreatitis     Past Surgical History:  Procedure Laterality Date  . ABDOMINAL HYSTERECTOMY     partial  . APPENDECTOMY     . bladder tac    . CHOLECYSTECTOMY  1999   cholelithiasis  . COLONOSCOPY  01/2007   Dr Olevia Perches: normal  . COLONOSCOPY  12/2000   Dr Deatra Ina- diverticulosis  . ESOPHAGOGASTRODUODENOSCOPY     Dr Damaris Hippo cannot remember  . EUS N/A 03/24/2012   Dr. Paulita Fujita: suspected passed CBD stone as cause of acute pancreatitis, suspective chronic pancreatitis.   Marland Kitchen EXCISION OF BREAST BIOPSY    . HIP PINNING,CANNULATED Left 01/15/2015   Procedure: CANNULATED HIP PINNING/LEFT;  Surgeon: Renette Butters, MD;  Location: [place];  Service: Orthopedics;  Laterality: Left;  Marland Kitchen VEIN SURGERY        reports that she has never smoked. She has never used smokeless tobacco. She reports that she does not drink alcohol or use drugs. Social History   Socioeconomic History  . Marital status: Married    Spouse name: Not on file  . Number of children: 1  . Years of education: Not on file  . Highest education level: Not on file  Occupational History  . Occupation: retired, tobacco field  Social Needs  . Financial resource strain: Not on file  . Food insecurity:    Worry: Not on file    Inability: Not on file  . Transportation needs:    Medical: Not on file    Non-medical: Not on file  Tobacco Use  . Smoking status: Never Smoker  . Smokeless tobacco: Never Used  Substance and Sexual Activity  . Alcohol use: No    Alcohol/week: 0.0 standard drinks  . Drug use: No  . Sexual activity: Not on file  Lifestyle  . Physical activity:    Days per week: Not on file    Minutes per session: Not on file  . Stress: Not on file  Relationships  . Social connections:    Talks on phone: Not on file    Gets together: Not on file    Attends religious service: Not on file    Active member of club or organization: Not on file    Attends meetings of clubs or organizations: Not on file    Relationship status: Not on file  . Intimate partner violence:    Fear of current or ex partner: Not on file    Emotionally abused:  Not on file    Physically abused: Not on file    Forced sexual activity: Not on file  Other Topics Concern  . Not on file  Social History Narrative   Lost 1 son age 37-?MI   1 living son         Functional Status Survey:    Allergies  Allergen Reactions  . Alendronate     Unknown reaction  . Cephalexin Other (See Comments)    Unknown reaction Unknown reaction Unknown reaction  . Clindamycin Hcl Other (See Comments)  . Clindamycin/Lincomycin Hives  . Creon [Pancreatin] Hives  . Doxycycline Other (See Comments)    Unknown reaction Unknown reaction  . Florastor [Yeast] Hives  . Other Other (See Comments) and Hives  . Penicillins Other (See Comments)  Has patient had a PCN reaction causing immediate rash, facial/tongue/throat swelling, SOB or lightheadedness with hypotension: unknown Has patient had a PCN reaction causing severe rash involving mucus membranes or skin necrosis: unknown Has patient had a PCN reaction that required hospitalization unknown Has patient had a PCN reaction occurring within the last 10 years:unknown If all of the above answers are "NO", then may proceed with Cephalosporin use.  Has patient had a PCN reaction causing immediate rash, facial/tongue/throat swelling, SOB or lightheadedness with hypotension: unknown Has patient had a PCN reaction causing severe rash involving mucus membranes or skin necrosis: unknown Has patient had a PCN reaction that required hospitalization unknown Has patient had a PCN reaction occurring within the last 10 years:unknown If all of the above answers are "NO", then may proceed with Cephalosporin use.  Marland Kitchen Zenpep [Pancrelipase (Lip-Prot-Amyl)] Hives    Pertinent  Health Maintenance Due  Topic Date Due  . FOOT EXAM  05/31/2018 (Originally 11/28/1941)  . HEMOGLOBIN A1C  05/31/2018 (Originally 07/27/2012)  . OPHTHALMOLOGY EXAM  05/31/2018 (Originally 11/28/1941)  . URINE MICROALBUMIN  05/31/2018 (Originally 11/28/1941)   . INFLUENZA VACCINE  09/11/2018  . DEXA SCAN  Completed  . PNA vac Low Risk Adult  Completed    Medications: Outpatient Encounter Medications as of 05/13/2018  Medication Sig  . amLODipine (NORVASC) 5 MG tablet Take 5 mg by mouth every morning.   Marland Kitchen aspirin EC 81 MG tablet Take 81 mg by mouth at bedtime.   . calcium carbonate (CALTRATE 600) 1500 (600 Ca) MG TABS tablet Take 1 tablet (1,500 mg total) by mouth 2 (two) times daily with a meal.  . carboxymethylcellulose (REFRESH PLUS) 0.5 % SOLN 1 drop 3 (three) times daily as needed.  . COSOPT PF 22.3-6.8 MG/ML SOLN Place 1 drop into both eyes 2 (two) times daily.   Marland Kitchen docusate sodium (COLACE) 100 MG capsule Take 100 mg by mouth 2 (two) times daily.  . fluticasone (FLONASE) 50 MCG/ACT nasal spray Place 1 spray into both nostrils daily.  . furosemide (LASIX) 20 MG tablet Take 20 mg by mouth daily.  Marland Kitchen latanoprost (XALATAN) 0.005 % ophthalmic solution Place 1 drop into both eyes at bedtime.  Marland Kitchen levocetirizine (XYZAL) 5 MG tablet Take 5 mg by mouth every evening.  Marland Kitchen levothyroxine (SYNTHROID, LEVOTHROID) 100 MCG tablet Take 1 tablet (100 mcg total) by mouth daily.  Marland Kitchen lisinopril (PRINIVIL,ZESTRIL) 2.5 MG tablet Take 2.5 mg by mouth daily.   Marland Kitchen loperamide (IMODIUM) 2 MG capsule Take 2 mg by mouth daily as needed for diarrhea or loose stools.   . metFORMIN (GLUCOPHAGE-XR) 500 MG 24 hr tablet Take 500 mg by mouth daily with breakfast.   . Multiple Vitamin (MULTIVITAMIN WITH MINERALS) TABS tablet Take 1 tablet by mouth daily.  . NON FORMULARY Diet Type: NAS  . Omega-3 Fatty Acids (FISH OIL) 1000 MG CAPS Take 1 capsule by mouth daily.   . potassium chloride SA (K-DUR,KLOR-CON) 20 MEQ tablet Take 20 mEq by mouth daily.  . ranitidine (ZANTAC) 150 MG tablet Take 150 mg by mouth 2 (two) times daily.  Marland Kitchen ZENPEP 20000-63000 units CPEP TAKE ONE CAPSULE BY MOUTH THREE TIMES DAILY BEFORE MEALS   No facility-administered encounter medications on file as of 05/13/2018.      Review of Systems  Vitals:   05/13/18 1012  BP: (!) 107/52  Pulse: 63  Resp: 18  Temp: (!) 96.5 F (35.8 C)  Weight: 148 lb 12.8 oz (67.5 kg)  Height: 5\' 2"  (  1.575 m)   Body mass index is 27.22 kg/m. Physical Exam Vitals signs reviewed.  Constitutional:      Appearance: Normal appearance.  HENT:     Head: Normocephalic.     Nose: Nose normal.     Mouth/Throat:     Mouth: Mucous membranes are moist.     Pharynx: Oropharynx is clear.  Neck:     Musculoskeletal: Neck supple.  Cardiovascular:     Rate and Rhythm: Normal rate and regular rhythm.     Pulses: Normal pulses.     Heart sounds: No murmur.  Pulmonary:     Effort: Pulmonary effort is normal. No respiratory distress.     Breath sounds: Normal breath sounds. No wheezing or rales.  Abdominal:     General: Abdomen is flat. Bowel sounds are normal. There is no distension.     Palpations: Abdomen is soft.     Tenderness: There is no abdominal tenderness. There is no guarding.  Musculoskeletal:     Comments: Bilateral LE edema slightly better  Skin:    General: Skin is warm and dry.  Neurological:     General: No focal deficit present.     Mental Status: She is alert.  Psychiatric:        Mood and Affect: Mood normal.        Thought Content: Thought content normal.     Labs reviewed: Basic Metabolic Panel: Recent Labs    05/06/18 0759 05/10/18 0700 05/13/18 0714  NA 139 139 141  K 3.5 3.4* 3.2*  CL 106 107 106  CO2 24 24 25   GLUCOSE 100* 92 100*  BUN 16 22 20   CREATININE 0.61 0.64 0.67  CALCIUM 8.9 8.7* 9.1   Liver Function Tests: Recent Labs    04/25/18 0002 04/30/18 0500  AST 27 19  ALT 18 17  ALKPHOS 106 92  BILITOT 0.5 0.3  PROT 6.9 6.0*  ALBUMIN 4.1 3.4*   No results for input(s): LIPASE, AMYLASE in the last 8760 hours. No results for input(s): AMMONIA in the last 8760 hours. CBC: Recent Labs    04/25/18 0002 04/25/18 0639 04/30/18 0500 05/06/18 0759  WBC 9.6 5.5 6.9 6.0   NEUTROABS 7.2  --   --  4.1  HGB 12.2 11.1* 12.0 12.6  HCT 35.2* 32.6* 37.2 38.6  MCV 88.2 89.6 95.1 91.9  PLT 254 205 235 235   Cardiac Enzymes: Recent Labs    04/25/18 0002  TROPONINI <0.03   BNP: Invalid input(s): POCBNP CBG: Recent Labs    04/25/18 2142  GLUCAP 118*    Procedures and Imaging Studies During Stay: Ct Head Wo Contrast  Result Date: 04/25/2018 CLINICAL DATA:  Altered LOC EXAM: CT HEAD WITHOUT CONTRAST TECHNIQUE: Contiguous axial images were obtained from the base of the skull through the vertex without intravenous contrast. COMPARISON:  CT brain 10/14/2007 FINDINGS: Brain: No acute territorial infarction, hemorrhage or intracranial mass. Chronic appearing small infarct in the left cerebellum. Mild atrophy. Mild small vessel ischemic changes of the white matter. The ventricles are nonenlarged. Vascular: No hyperdense vessels.  Carotid vascular calcification Skull: Fluid in the left mastoid air cells.  No skull fracture Sinuses/Orbits: Mucosal thickening and fluid in the left maxillary sinus. Other: None IMPRESSION: 1. No CT evidence for acute intracranial abnormality. 2. Atrophy and small vessel ischemic changes of the white matter. 3. Fluid in the left mastoid air cells. Electronically Signed   By: Donavan Foil M.D.   On: 04/25/2018 00:40  Assessment/Plan:   Hypertension  BP stable on Lisinopril and Norvasc  Controlled type 2 diabetes mellitus  BS mostly around 100 Continue Metformin  Follow with PCP  Idiopathic chronic pancreatitis  Continue on Zenpep  Hyponatremia Now that she is on Lasix will need close follow up of her Sodium Will repeat Sodium in 1 week  Auditory hallucinations Were resolved Hypokalemia Supplemented Hypothyroid On Synthroid Dose was changed iin hospital  Will repeat TSH in 8 weeks H/o Allergies On Flonase and Xyzal Discharge [place] with her family   Patient is being discharged with the following home health  services:  [facility]  Patient is being discharged with the following durable medical equipment:  None  Patient has been advised to f/u with their PCP in 1-2 weeks to for a transitions of care visit.  Social services at their facility was responsible for arranging this appointment.  Pt was provided with adequate prescriptions of noncontrolled medications to reach the scheduled appointment .  For controlled substances, a limited supply was provided as appropriate for the individual patient.  If the pt normally receives these medications from a pain clinic or has a contract with another physician, these medications should be received from that clinic or physician only).    Future labs/tests needed:  BMP in 1 week and TSH in 8 weeks Discharge planning More then 35 min

## 2018-05-14 DIAGNOSIS — M130 Polyarthritis, unspecified: Secondary | ICD-10-CM | POA: Diagnosis not present

## 2018-05-14 DIAGNOSIS — F419 Anxiety disorder, unspecified: Secondary | ICD-10-CM | POA: Diagnosis not present

## 2018-05-14 DIAGNOSIS — G4700 Insomnia, unspecified: Secondary | ICD-10-CM | POA: Diagnosis not present

## 2018-05-14 DIAGNOSIS — T7840XD Allergy, unspecified, subsequent encounter: Secondary | ICD-10-CM | POA: Diagnosis not present

## 2018-05-14 DIAGNOSIS — M545 Low back pain: Secondary | ICD-10-CM | POA: Diagnosis not present

## 2018-05-16 DIAGNOSIS — Z79899 Other long term (current) drug therapy: Secondary | ICD-10-CM | POA: Diagnosis not present

## 2018-05-16 DIAGNOSIS — M238X1 Other internal derangements of right knee: Secondary | ICD-10-CM | POA: Diagnosis not present

## 2018-05-16 DIAGNOSIS — M25511 Pain in right shoulder: Secondary | ICD-10-CM | POA: Diagnosis not present

## 2018-05-16 DIAGNOSIS — E785 Hyperlipidemia, unspecified: Secondary | ICD-10-CM | POA: Diagnosis not present

## 2018-05-16 DIAGNOSIS — E871 Hypo-osmolality and hyponatremia: Secondary | ICD-10-CM | POA: Diagnosis not present

## 2018-05-16 DIAGNOSIS — Z1331 Encounter for screening for depression: Secondary | ICD-10-CM | POA: Diagnosis not present

## 2018-05-16 DIAGNOSIS — M25562 Pain in left knee: Secondary | ICD-10-CM | POA: Diagnosis not present

## 2018-05-16 DIAGNOSIS — M25461 Effusion, right knee: Secondary | ICD-10-CM | POA: Diagnosis not present

## 2018-05-16 DIAGNOSIS — Z7984 Long term (current) use of oral hypoglycemic drugs: Secondary | ICD-10-CM | POA: Diagnosis not present

## 2018-05-16 DIAGNOSIS — Z23 Encounter for immunization: Secondary | ICD-10-CM | POA: Diagnosis not present

## 2018-05-16 DIAGNOSIS — I10 Essential (primary) hypertension: Secondary | ICD-10-CM | POA: Diagnosis not present

## 2018-05-16 DIAGNOSIS — Z96641 Presence of right artificial hip joint: Secondary | ICD-10-CM | POA: Diagnosis not present

## 2018-05-16 DIAGNOSIS — M6281 Muscle weakness (generalized): Secondary | ICD-10-CM | POA: Diagnosis not present

## 2018-05-16 DIAGNOSIS — Z09 Encounter for follow-up examination after completed treatment for conditions other than malignant neoplasm: Secondary | ICD-10-CM | POA: Diagnosis not present

## 2018-05-16 DIAGNOSIS — M25662 Stiffness of left knee, not elsewhere classified: Secondary | ICD-10-CM | POA: Diagnosis not present

## 2018-05-16 DIAGNOSIS — K219 Gastro-esophageal reflux disease without esophagitis: Secondary | ICD-10-CM | POA: Diagnosis not present

## 2018-05-16 DIAGNOSIS — M1711 Unilateral primary osteoarthritis, right knee: Secondary | ICD-10-CM | POA: Diagnosis not present

## 2018-05-16 DIAGNOSIS — R2689 Other abnormalities of gait and mobility: Secondary | ICD-10-CM | POA: Diagnosis not present

## 2018-05-16 DIAGNOSIS — M25761 Osteophyte, right knee: Secondary | ICD-10-CM | POA: Diagnosis not present

## 2018-05-16 DIAGNOSIS — Z87440 Personal history of urinary (tract) infections: Secondary | ICD-10-CM | POA: Diagnosis not present

## 2018-05-16 DIAGNOSIS — E7800 Pure hypercholesterolemia, unspecified: Secondary | ICD-10-CM | POA: Diagnosis not present

## 2018-05-16 DIAGNOSIS — E119 Type 2 diabetes mellitus without complications: Secondary | ICD-10-CM | POA: Diagnosis not present

## 2018-05-16 DIAGNOSIS — Z7982 Long term (current) use of aspirin: Secondary | ICD-10-CM | POA: Diagnosis not present

## 2018-05-16 DIAGNOSIS — R1032 Left lower quadrant pain: Secondary | ICD-10-CM | POA: Diagnosis not present

## 2018-05-16 DIAGNOSIS — M659 Synovitis and tenosynovitis, unspecified: Secondary | ICD-10-CM | POA: Diagnosis not present

## 2018-05-16 DIAGNOSIS — R102 Pelvic and perineal pain: Secondary | ICD-10-CM | POA: Diagnosis not present

## 2018-05-16 DIAGNOSIS — Z125 Encounter for screening for malignant neoplasm of prostate: Secondary | ICD-10-CM | POA: Diagnosis not present

## 2018-05-16 DIAGNOSIS — K859 Acute pancreatitis without necrosis or infection, unspecified: Secondary | ICD-10-CM | POA: Diagnosis not present

## 2018-05-16 DIAGNOSIS — M25611 Stiffness of right shoulder, not elsewhere classified: Secondary | ICD-10-CM | POA: Diagnosis not present

## 2018-05-16 DIAGNOSIS — I2510 Atherosclerotic heart disease of native coronary artery without angina pectoris: Secondary | ICD-10-CM | POA: Diagnosis not present

## 2018-05-16 DIAGNOSIS — J45909 Unspecified asthma, uncomplicated: Secondary | ICD-10-CM | POA: Diagnosis not present

## 2018-05-16 DIAGNOSIS — Z681 Body mass index (BMI) 19 or less, adult: Secondary | ICD-10-CM | POA: Diagnosis not present

## 2018-05-16 DIAGNOSIS — N471 Phimosis: Secondary | ICD-10-CM | POA: Diagnosis not present

## 2018-05-18 ENCOUNTER — Other Ambulatory Visit: Payer: Self-pay

## 2018-05-18 NOTE — Patient Outreach (Signed)
[facility]) Care Management  05/18/2018  DHANI IMEL [birth year] [id number]   Referral Date: 05/18/2018 Referral Source:  [facility] report Date of Admission: 04/27/2018 Diagnosis: Hyperosmolality with hypernatremia Date of Discharge: 05/14/2018 Facility:  [place]:  [facility]  Outreach attempt: Spoke with patient who requests that CM speak to [employer].  Spoke with Lattie Haw, she is able to verify HIPAA.  She states that patient is doing well and that someone is always with patient.  Lattie Haw states that home health is involved.  PT came on Sunday and the nurse on Monday.  She declines any present needs.   Social: Patient lives alone but has family present at all times and now has someone staying with her at night.  Patient needs assistance with all aspects of care.  Family is readily available to assist.     Conditions: Patient in the hospitalfrom 03/14-03/17 for Mental status Changes and Hallucinations.with Hyponatremia Patient has a history of hypertension, hypothyroidism and chronic pancreatitis.Diabetes type 2, Hyperlipidemia and Vit B12 def, and Environmental Allergies per record.  Lattie Haw states that patient checks her sugars daily and they are controlled.  She states that sugars run in the 100's.    Medications: Granddaughter Lattie Haw able to review medications and states that patient takes medications as prescribed and offers no questions or concerns with medications.  Appointments: Patient has follow up with physician on 05/20/2018.  Lattie Haw states she will be taking patient to her appointment.  Consent: RN CM reviewed [facility] services.  Lattie Haw declines any needs at this time.    Plan: RN CM will close case.     Jone Baseman, RN, MSN [facility] Care Management Care Management Coordinator Direct Line [phone] Toll Free: [phone]  Fax: [phone]

## 2018-05-20 DIAGNOSIS — N400 Enlarged prostate without lower urinary tract symptoms: Secondary | ICD-10-CM | POA: Diagnosis not present

## 2018-05-20 DIAGNOSIS — E876 Hypokalemia: Secondary | ICD-10-CM | POA: Diagnosis not present

## 2018-05-20 DIAGNOSIS — Z09 Encounter for follow-up examination after completed treatment for conditions other than malignant neoplasm: Secondary | ICD-10-CM | POA: Diagnosis not present

## 2018-05-20 DIAGNOSIS — E871 Hypo-osmolality and hyponatremia: Secondary | ICD-10-CM | POA: Diagnosis not present

## 2018-05-20 DIAGNOSIS — T7840XD Allergy, unspecified, subsequent encounter: Secondary | ICD-10-CM | POA: Diagnosis not present

## 2018-05-20 DIAGNOSIS — J342 Deviated nasal septum: Secondary | ICD-10-CM | POA: Diagnosis not present

## 2018-05-20 DIAGNOSIS — J329 Chronic sinusitis, unspecified: Secondary | ICD-10-CM | POA: Diagnosis not present

## 2018-05-27 DIAGNOSIS — Z9109 Other allergy status, other than to drugs and biological substances: Secondary | ICD-10-CM | POA: Diagnosis not present

## 2018-06-03 DIAGNOSIS — Z9109 Other allergy status, other than to drugs and biological substances: Secondary | ICD-10-CM | POA: Diagnosis not present

## 2018-06-10 DIAGNOSIS — Z9109 Other allergy status, other than to drugs and biological substances: Secondary | ICD-10-CM | POA: Diagnosis not present

## 2018-06-17 DIAGNOSIS — Z9109 Other allergy status, other than to drugs and biological substances: Secondary | ICD-10-CM | POA: Diagnosis not present

## 2018-06-24 DIAGNOSIS — Z9109 Other allergy status, other than to drugs and biological substances: Secondary | ICD-10-CM | POA: Diagnosis not present

## 2018-06-28 DIAGNOSIS — H04123 Dry eye syndrome of bilateral lacrimal glands: Secondary | ICD-10-CM | POA: Diagnosis not present

## 2018-06-28 DIAGNOSIS — H401132 Primary open-angle glaucoma, bilateral, moderate stage: Secondary | ICD-10-CM | POA: Diagnosis not present

## 2018-06-28 DIAGNOSIS — E119 Type 2 diabetes mellitus without complications: Secondary | ICD-10-CM | POA: Diagnosis not present

## 2018-07-01 DIAGNOSIS — Z9109 Other allergy status, other than to drugs and biological substances: Secondary | ICD-10-CM | POA: Diagnosis not present

## 2018-07-03 ENCOUNTER — Other Ambulatory Visit: Payer: Self-pay

## 2018-07-03 ENCOUNTER — Emergency Department (HOSPITAL_COMMUNITY): Payer: PPO

## 2018-07-03 ENCOUNTER — Emergency Department (HOSPITAL_COMMUNITY)
Admission: EM | Admit: 2018-07-03 | Discharge: 2018-07-03 | Disposition: A | Discharge status: Home / Self Care | Payer: PPO | Attending: Emergency Medicine | Admitting: Emergency Medicine

## 2018-07-03 ENCOUNTER — Encounter (HOSPITAL_COMMUNITY): Payer: Self-pay | Admitting: Emergency Medicine

## 2018-07-03 DIAGNOSIS — N3001 Acute cystitis with hematuria: Secondary | ICD-10-CM | POA: Diagnosis not present

## 2018-07-03 DIAGNOSIS — Z7982 Long term (current) use of aspirin: Secondary | ICD-10-CM | POA: Insufficient documentation

## 2018-07-03 DIAGNOSIS — E119 Type 2 diabetes mellitus without complications: Secondary | ICD-10-CM | POA: Diagnosis not present

## 2018-07-03 DIAGNOSIS — R3915 Urgency of urination: Secondary | ICD-10-CM | POA: Insufficient documentation

## 2018-07-03 DIAGNOSIS — R52 Pain, unspecified: Secondary | ICD-10-CM | POA: Diagnosis not present

## 2018-07-03 DIAGNOSIS — M549 Dorsalgia, unspecified: Secondary | ICD-10-CM | POA: Diagnosis not present

## 2018-07-03 DIAGNOSIS — M545 Low back pain, unspecified: Secondary | ICD-10-CM

## 2018-07-03 DIAGNOSIS — Z79899 Other long term (current) drug therapy: Secondary | ICD-10-CM | POA: Diagnosis not present

## 2018-07-03 DIAGNOSIS — I10 Essential (primary) hypertension: Secondary | ICD-10-CM | POA: Insufficient documentation

## 2018-07-03 DIAGNOSIS — E039 Hypothyroidism, unspecified: Secondary | ICD-10-CM | POA: Insufficient documentation

## 2018-07-03 DIAGNOSIS — R300 Dysuria: Secondary | ICD-10-CM | POA: Diagnosis not present

## 2018-07-03 DIAGNOSIS — K7689 Other specified diseases of liver: Secondary | ICD-10-CM | POA: Diagnosis not present

## 2018-07-03 LAB — CBC WITH DIFFERENTIAL/PLATELET
Abs Immature Granulocytes: 0.06 10*3/uL (ref 0.00–0.07)
Basophils Absolute: 0.1 10*3/uL (ref 0.0–0.1)
Basophils Relative: 0 %
Eosinophils Absolute: 0 10*3/uL (ref 0.0–0.5)
Eosinophils Relative: 0 %
HCT: 38.8 % (ref 36.0–46.0)
Hemoglobin: 12.7 g/dL (ref 12.0–15.0)
Immature Granulocytes: 0 %
Lymphocytes Relative: 5 %
Lymphs Abs: 0.7 10*3/uL (ref 0.7–4.0)
MCH: 30.4 pg (ref 26.0–34.0)
MCHC: 32.7 g/dL (ref 30.0–36.0)
MCV: 92.8 fL (ref 80.0–100.0)
Monocytes Absolute: 0.7 10*3/uL (ref 0.1–1.0)
Monocytes Relative: 5 %
Neutro Abs: 12.9 10*3/uL — ABNORMAL HIGH (ref 1.7–7.7)
Neutrophils Relative %: 90 %
Platelets: 164 10*3/uL (ref 150–400)
RBC: 4.18 MIL/uL (ref 3.87–5.11)
RDW: 13.6 % (ref 11.5–15.5)
WBC: 14.5 10*3/uL — ABNORMAL HIGH (ref 4.0–10.5)
nRBC: 0 % (ref 0.0–0.2)

## 2018-07-03 LAB — COMPREHENSIVE METABOLIC PANEL
ALT: 14 U/L (ref 0–44)
AST: 22 U/L (ref 15–41)
Albumin: 4.1 g/dL (ref 3.5–5.0)
Alkaline Phosphatase: 88 U/L (ref 38–126)
Anion gap: 11 (ref 5–15)
BUN: 15 mg/dL (ref 8–23)
CO2: 21 mmol/L — ABNORMAL LOW (ref 22–32)
Calcium: 8.8 mg/dL — ABNORMAL LOW (ref 8.9–10.3)
Chloride: 103 mmol/L (ref 98–111)
Creatinine, Ser: 0.62 mg/dL (ref 0.44–1.00)
GFR calc Af Amer: >60 mL/min (ref >60)
GFR calc non Af Amer: >60 mL/min (ref >60)
Glucose, Bld: 104 mg/dL — ABNORMAL HIGH (ref 70–99)
Potassium: 3.4 mmol/L — ABNORMAL LOW (ref 3.5–5.1)
Sodium: 135 mmol/L (ref 135–145)
Total Bilirubin: 1.1 mg/dL (ref 0.3–1.2)
Total Protein: 6.9 g/dL (ref 6.5–8.1)

## 2018-07-03 LAB — URINALYSIS, ROUTINE W REFLEX MICROSCOPIC
Bacteria, UA: NONE SEEN
Bilirubin Urine: NEGATIVE
Glucose, UA: NEGATIVE mg/dL
Ketones, ur: NEGATIVE mg/dL
Leukocytes,Ua: NEGATIVE
Nitrite: NEGATIVE
Protein, ur: NEGATIVE mg/dL
Specific Gravity, Urine: 1.013 (ref 1.005–1.030)
pH: 7 (ref 5.0–8.0)

## 2018-07-03 LAB — LACTIC ACID, PLASMA
Lactic Acid, Venous: 1.3 mmol/L (ref 0.5–1.9)
Lactic Acid, Venous: 1 mmol/L (ref 0.5–1.9)

## 2018-07-03 MED ORDER — SODIUM CHLORIDE 0.9 % IV BOLUS
500.0000 mL | Freq: Once | INTRAVENOUS | Status: AC
  Administered 2018-07-03: 14:00:00 500 mL via INTRAVENOUS

## 2018-07-03 MED ORDER — IOHEXOL 300 MG/ML  SOLN
100.0000 mL | Freq: Once | INTRAMUSCULAR | Status: AC | PRN
  Administered 2018-07-03: 16:00:00 75 mL via INTRAVENOUS

## 2018-07-03 NOTE — ED Provider Notes (Signed)
[facility] Provider Note   CSN: [id number] Arrival date & time: 07/03/18  1226    History   Chief Complaint Chief Complaint  Patient presents with  . Urinary Tract Infection    HPI Kathleen [name] is a 85 y.o. female.  She is a rather poor historian.  She said she went to a minute clinic at [facility] in [place] today with a complaint of back pain and bilateral thigh pain and nausea along with urinary frequency that started last night.  She was found to be slightly febrile there.  They did a urine dipstick and it was positive for blood and leuks but did not have enough of a sample for culture.  They recommended that she come here.  Patient herself denies any fever cough shortness of breath abdominal pain vomiting diarrhea.  She does endorse some nausea intermittently and some urinary frequency.  She denies any thigh pain currently.  She has a little back pain.  She has had UTIs before.     The history is provided by the patient (and NP at minute clinic).  Urinary Tract Infection  Pain quality:  Burning Pain severity:  Moderate Onset quality:  Gradual Duration:  24 hours Timing:  Intermittent Progression:  Unchanged Chronicity:  New Recent urinary tract infections: no   Relieved by:  None tried Worsened by:  Nothing Ineffective treatments:  None tried Urinary symptoms: frequent urination   Associated symptoms: fever, flank pain and nausea   Associated symptoms: no abdominal pain and no vomiting     Past Medical History:  Diagnosis Date  . Arthritis   . Diabetes mellitus without complication ([place])    "borderline"  . Diverticulosis of colon   . High cholesterol   . Hypertension   . Hypothyroidism   . Pancreatitis     Patient Active Problem List   Diagnosis Date Noted  . Leg swelling 05/07/2018  . Chronic non-seasonal allergic rhinitis 04/28/2018  . GERD without esophagitis 04/28/2018  . Hyperlipidemia associated with type 2 diabetes mellitus ([place])  04/28/2018  . Chronic open angle glaucoma 04/28/2018  . Constipation in female 04/28/2018  . Hyponatremia 04/25/2018  . Hallucinations 04/25/2018  . Acute bronchitis 04/24/2018  . CD (contact dermatitis) 04/24/2018  . Dermatitis, eczematoid 04/24/2018  . Allergic reaction 05/22/2016  . Fracture   . Controlled type 2 diabetes mellitus without complication, without long-term current use of insulin ([place]) 01/16/2015  . Hip fracture, left ([place]) 01/15/2015  . Varicose veins of leg with complications 11/91/4782  . Exocrine pancreatic insufficiency ([place]) 11/03/2013  . Chronic pancreatitis ([place]) 11/03/2013  . Pancreatitis 01/27/2012  . Hypokalemia 01/27/2012  . Hypertension associated with type 2 diabetes mellitus ([place])   . Hypothyroidism     Past Surgical History:  Procedure Laterality Date  . ABDOMINAL HYSTERECTOMY     partial  . APPENDECTOMY    . bladder tac    . CHOLECYSTECTOMY  1999   cholelithiasis  . COLONOSCOPY  01/2007   Dr Olevia Perches: normal  . COLONOSCOPY  12/2000   Dr Deatra Ina- diverticulosis  . ESOPHAGOGASTRODUODENOSCOPY     Dr Damaris Hippo cannot remember  . EUS N/A 03/24/2012   Dr. Paulita Fujita: suspected passed CBD stone as cause of acute pancreatitis, suspective chronic pancreatitis.   Marland Kitchen EXCISION OF BREAST BIOPSY    . HIP PINNING,CANNULATED Left 01/15/2015   Procedure: CANNULATED HIP PINNING/LEFT;  Surgeon: Renette Butters, MD;  Location: [facility];  Service: Orthopedics;  Laterality: Left;  Marland Kitchen VEIN SURGERY  OB History    Gravida  2   Para  2   Term  2   Preterm      AB      Living  1     SAB      TAB      Ectopic      Multiple      Live Births               Home Medications    Prior to Admission medications   Medication Sig Start Date End Date Taking? Authorizing Provider  amLODipine (NORVASC) 5 MG tablet Take 5 mg by mouth every morning.     [provider]  aspirin EC 81 MG tablet Take 81 mg by mouth at bedtime.     [provider]  calcium carbonate (CALTRATE 600) 1500 (600 Ca) MG TABS tablet Take 1 tablet (1,500 mg total) by mouth 2 (two) times daily with a meal. 04/27/18   Kathie Dike, MD  carboxymethylcellulose (REFRESH PLUS) 0.5 % SOLN 1 drop 3 (three) times daily as needed.    [provider]  COSOPT PF 22.3-6.8 MG/ML SOLN Place 1 drop into both eyes 2 (two) times daily.  10/11/13   [provider]  docusate sodium (COLACE) 100 MG capsule Take 100 mg by mouth 2 (two) times daily. 04/28/18   [provider]  fluticasone (FLONASE) 50 MCG/ACT nasal spray Place 1 spray into both nostrils daily.    [provider]  furosemide (LASIX) 20 MG tablet Take 20 mg by mouth daily. 05/07/18   [provider]  latanoprost (XALATAN) 0.005 % ophthalmic solution Place 1 drop into both eyes at bedtime. 04/27/18   Kathie Dike, MD  levocetirizine (XYZAL) 5 MG tablet Take 5 mg by mouth every evening.    [provider]  levothyroxine (SYNTHROID, LEVOTHROID) 100 MCG tablet Take 1 tablet (100 mcg total) by mouth daily. 04/27/18   Kathie Dike, MD  lisinopril (PRINIVIL,ZESTRIL) 2.5 MG tablet Take 2.5 mg by mouth daily.     [provider]  loperamide (IMODIUM) 2 MG capsule Take 2 mg by mouth daily as needed for diarrhea or loose stools.  04/27/18   [provider]  metFORMIN (GLUCOPHAGE-XR) 500 MG 24 hr tablet Take 500 mg by mouth daily with breakfast.     [provider]  Multiple Vitamin (MULTIVITAMIN WITH MINERALS) TABS tablet Take 1 tablet by mouth daily.    [provider]  NON FORMULARY Diet Type: NAS    [provider]  Omega-3 Fatty Acids (FISH OIL) 1000 MG CAPS Take 1 capsule by mouth daily.  04/27/18   [provider]  potassium chloride SA (K-DUR,KLOR-CON) 20 MEQ tablet Take 20 mEq by mouth daily. 05/11/18   [provider]  ranitidine (ZANTAC) 150 MG tablet Take 150 mg by mouth 2 (two) times daily.     [provider]  ZENPEP 20000-63000 units CPEP TAKE ONE CAPSULE BY MOUTH THREE TIMES DAILY BEFORE MEALS Patient not taking: Reported on 05/18/2018 06/19/16   Carlis Stable, NP    Family History Family History  Problem Relation Age of Onset  . Diabetes Father   . Diabetes Mother   . Cancer Mother        ovarian  . Cancer Sister 68       breast  . Cancer Sister 62       bone    Social History Social History   Tobacco Use  .  Smoking status: Never Smoker  . Smokeless tobacco: Never Used  Substance Use Topics  . Alcohol use: No    Alcohol/week: 0.0 standard drinks  . Drug use: No     Allergies   Alendronate; Cephalexin; Clindamycin hcl; Clindamycin/lincomycin; Creon [pancreatin]; Doxycycline; Florastor [yeast]; Other; Penicillins; and Zenpep [pancrelipase (lip-prot-amyl)]   Review of Systems Review of Systems  Constitutional: Positive for fever.  HENT: Negative for sore throat.   Eyes: Negative for visual disturbance.  Respiratory: Negative for shortness of breath.   Cardiovascular: Negative for chest pain.  Gastrointestinal: Positive for nausea. Negative for abdominal pain and vomiting.  Genitourinary: Positive for flank pain. Negative for dysuria.  Musculoskeletal: Positive for back pain.  Skin: Negative for rash.  Neurological: Negative for headaches.     Physical Exam Updated Vital Signs BP (!) 152/67 (BP Location: Right Arm)   Pulse (!) 103   Temp 99.1 F (37.3 C) (Oral)   Resp 16   Ht 5\' 5"  (1.651 m)   Wt 61.2 kg   SpO2 98%   BMI 22.47 kg/m   Physical Exam Vitals signs and nursing note reviewed.  Constitutional:      General: She is not in acute distress.    Appearance: She is well-developed.  HENT:     Head: Normocephalic and atraumatic.  Eyes:     Conjunctiva/sclera: Conjunctivae normal.  Neck:     Musculoskeletal: Neck supple.  Cardiovascular:     Rate and Rhythm: Regular rhythm. Tachycardia present.     Pulses: Normal pulses.      Heart sounds: No murmur.  Pulmonary:     Effort: Pulmonary effort is normal. No respiratory distress.     Breath sounds: Normal breath sounds.  Abdominal:     Palpations: Abdomen is soft. There is no mass.     Tenderness: There is no abdominal tenderness. There is no right CVA tenderness, left CVA tenderness, guarding or rebound.  Musculoskeletal: Normal range of motion.     Right lower leg: No edema.     Left lower leg: No edema.  Skin:    General: Skin is warm and dry.     Capillary Refill: Capillary refill takes less than 2 seconds.  Neurological:     General: No focal deficit present.     Mental Status: She is alert. Mental status is at baseline.      ED Treatments / Results  Labs (all labs ordered are listed, but only abnormal results are displayed) Labs Reviewed  URINALYSIS, ROUTINE W REFLEX MICROSCOPIC - Abnormal; Notable for the following components:      Result Value   Hgb urine dipstick SMALL (*)    All other components within normal limits  CBC WITH DIFFERENTIAL/PLATELET - Abnormal; Notable for the following components:   WBC 14.5 (*)    Neutro Abs 12.9 (*)    All other components within normal limits  COMPREHENSIVE METABOLIC PANEL - Abnormal; Notable for the following components:   Potassium 3.4 (*)    CO2 21 (*)    Glucose, Bld 104 (*)    Calcium 8.8 (*)    All other components within normal limits  CULTURE, BLOOD (ROUTINE X 2)  CULTURE, BLOOD (ROUTINE X 2)  URINE CULTURE  LACTIC ACID, PLASMA  LACTIC ACID, PLASMA    EKG None  Radiology Ct Abdomen Pelvis W Contrast  Result Date: 07/03/2018 CLINICAL DATA:  Urinary urgency with dysuria. EXAM: CT ABDOMEN AND PELVIS WITH CONTRAST TECHNIQUE: Multidetector CT imaging of the abdomen  and pelvis was performed using the standard protocol following bolus administration of intravenous contrast. CONTRAST:  16mL OMNIPAQUE IOHEXOL 300 MG/ML  SOLN COMPARISON:  03/29/2013 FINDINGS: Lower chest: atelectasis/scarring in  the lower lobes. Hepatobiliary: 8 mm low-density lesion in the lateral right liver has progressed slightly in the interval. 4.0 x 4.2 cm well-defined homogeneous lesion in the inferior right liver approaches water attenuation as compatible with a cyst. This is increased from 3.2 cm previously. Gallbladder is surgically absent. No substantial biliary dilatation. Pancreas: No focal mass lesion. No dilatation of the main duct. No intraparenchymal cyst. No peripancreatic edema. Spleen: No splenomegaly. No focal mass lesion. Adrenals/Urinary Tract: No adrenal nodule or mass. Cortical scarring noted in the kidneys bilaterally. No evidence for hydroureter. Bladder is distended. Stomach/Bowel: Stomach is unremarkable. No gastric wall thickening. No evidence of outlet obstruction. Duodenum is normally positioned as is the ligament of Treitz. No small bowel wall thickening. No small bowel dilatation. The terminal ileum is normal. No gross colonic mass. No colonic wall thickening. Vascular/Lymphatic: There is abdominal aortic atherosclerosis without aneurysm. There is no gastrohepatic or hepatoduodenal ligament lymphadenopathy. No intraperitoneal or retroperitoneal lymphadenopathy. No pelvic sidewall lymphadenopathy. Reproductive: The uterus is surgically absent. There is no adnexal mass. Other: No intraperitoneal free fluid. Musculoskeletal: Left paramidline ventral hernia contains only fat. Fixation hardware noted left femoral neck. No worrisome lytic or sclerotic osseous abnormality. IMPRESSION: 1. Distention in the urinary bladder. No findings to explain the patient's history of dysuria and urgency. 2. Hepatic cysts. 3.  Aortic Atherosclerois (ICD10-170.0) Electronically Signed   By: Misty Stanley M.D.   On: 07/03/2018 16:30    Procedures Procedures (including critical care time)  Medications Ordered in ED Medications  sodium chloride 0.9 % bolus 500 mL (0 mLs Intravenous Stopped 07/03/18 1410)  iohexol (OMNIPAQUE)  300 MG/ML solution 100 mL (75 mLs Intravenous Contrast Given 07/03/18 1605)     Initial Impression / Assessment and Plan / ED Course  I have reviewed the triage vital signs and the nursing notes.  Pertinent labs & imaging results that were available during my care of the patient were reviewed by me and considered in my medical decision making (see chart for details).  Clinical Course as of Jul 03 1213  Sat Jul 03, 3511  8623 85 year old female here with urinary symptoms and back pain since last evening with some nausea.  Seen at a minute clinic and found to be febrile with an abnormal urine dipstick.  Differential diagnosis includes UTI, [place], kidney stone, sepsis, Covid   [MB]  1610 CT reviewed by me, is a large liver cyst and also some bladder distention.  Awaiting radiology reading.   [MB]  9604 CT showing no signs of kidney stones.  They comment upon the liver cyst that was seen before may be slightly larger, along with some bladder distention.  We will p.o. trial the patient and likely she can be discharged.   [MB]  5409 Patient's nurse said that that she was able to urinate on her own after returning from CAT scan some sure that bladder distention is probably improved.  We will p.o. trial her here and reach out to family to come pick her up.  I reviewed the results of her tests with her.   [MB]    Clinical Course User Index [MB] Hayden Rasmussen, MD   Kathleen [name] was evaluated in Emergency Department on 07/03/2018 for the symptoms described in the history of present illness. She was evaluated  in the context of the global COVID-19 pandemic, which necessitated consideration that the patient might be at risk for infection with the SARS-CoV-2 virus that causes COVID-19. Institutional protocols and algorithms that pertain to the evaluation of patients at risk for COVID-19 are in a state of rapid change based on information released by regulatory bodies including the CDC and federal and state  organizations. These policies and algorithms were followed during the patient's care in the ED.       Final Clinical Impressions(s) / ED Diagnoses   Final diagnoses:  Dysuria  Bilateral low back pain without sciatica, unspecified chronicity    ED Discharge Orders    None       Hayden Rasmussen, MD 07/04/18 1215

## 2018-07-03 NOTE — ED Notes (Signed)
Pt not able to void at this time.  Bolus of fluid started.

## 2018-07-03 NOTE — Discharge Instructions (Signed)
You were seen in the emergency department for burning with urination and some low back pain.  You had a urinalysis here that did not show any obvious signs of urine infection.  We also did a CAT scan that did not show any serious findings.  You did have a liver cyst that was seen on prior CAT scans.  It will be important for you to stay well-hydrated and follow-up with your regular doctor.  Return if any worsening symptoms.

## 2018-07-03 NOTE — ED Triage Notes (Signed)
Pt c/o of burning with urination and increased frequency since last night.

## 2018-07-05 LAB — URINE CULTURE: Culture: NO GROWTH

## 2018-07-08 DIAGNOSIS — Z9109 Other allergy status, other than to drugs and biological substances: Secondary | ICD-10-CM | POA: Diagnosis not present

## 2018-07-08 DIAGNOSIS — E876 Hypokalemia: Secondary | ICD-10-CM | POA: Diagnosis not present

## 2018-07-08 DIAGNOSIS — R21 Rash and other nonspecific skin eruption: Secondary | ICD-10-CM | POA: Diagnosis not present

## 2018-07-08 DIAGNOSIS — E871 Hypo-osmolality and hyponatremia: Secondary | ICD-10-CM | POA: Diagnosis not present

## 2018-07-08 LAB — CULTURE, BLOOD (ROUTINE X 2)
Culture: NO GROWTH
Culture: NO GROWTH
Special Requests: ADEQUATE
Special Requests: ADEQUATE

## 2018-07-14 DIAGNOSIS — L509 Urticaria, unspecified: Secondary | ICD-10-CM | POA: Diagnosis not present

## 2018-07-14 DIAGNOSIS — J3089 Other allergic rhinitis: Secondary | ICD-10-CM | POA: Diagnosis not present

## 2018-07-14 DIAGNOSIS — Z9109 Other allergy status, other than to drugs and biological substances: Secondary | ICD-10-CM | POA: Diagnosis not present

## 2018-07-14 DIAGNOSIS — J301 Allergic rhinitis due to pollen: Secondary | ICD-10-CM | POA: Diagnosis not present

## 2018-07-14 DIAGNOSIS — T783XXD Angioneurotic edema, subsequent encounter: Secondary | ICD-10-CM | POA: Diagnosis not present

## 2018-07-22 DIAGNOSIS — Z9109 Other allergy status, other than to drugs and biological substances: Secondary | ICD-10-CM | POA: Diagnosis not present

## 2018-07-29 DIAGNOSIS — E538 Deficiency of other specified B group vitamins: Secondary | ICD-10-CM | POA: Diagnosis not present

## 2018-07-29 DIAGNOSIS — H6123 Impacted cerumen, bilateral: Secondary | ICD-10-CM | POA: Diagnosis not present

## 2018-07-29 DIAGNOSIS — I10 Essential (primary) hypertension: Secondary | ICD-10-CM | POA: Diagnosis not present

## 2018-07-29 DIAGNOSIS — E039 Hypothyroidism, unspecified: Secondary | ICD-10-CM | POA: Diagnosis not present

## 2018-07-29 DIAGNOSIS — K219 Gastro-esophageal reflux disease without esophagitis: Secondary | ICD-10-CM | POA: Diagnosis not present

## 2018-07-29 DIAGNOSIS — E119 Type 2 diabetes mellitus without complications: Secondary | ICD-10-CM | POA: Diagnosis not present

## 2018-07-29 DIAGNOSIS — J3089 Other allergic rhinitis: Secondary | ICD-10-CM | POA: Diagnosis not present

## 2018-07-29 DIAGNOSIS — Z0000 Encounter for general adult medical examination without abnormal findings: Secondary | ICD-10-CM | POA: Diagnosis not present

## 2018-08-05 DIAGNOSIS — Z9109 Other allergy status, other than to drugs and biological substances: Secondary | ICD-10-CM | POA: Diagnosis not present

## 2018-08-12 DIAGNOSIS — Z9109 Other allergy status, other than to drugs and biological substances: Secondary | ICD-10-CM | POA: Diagnosis not present

## 2018-08-19 DIAGNOSIS — Z9109 Other allergy status, other than to drugs and biological substances: Secondary | ICD-10-CM | POA: Diagnosis not present

## 2018-08-26 DIAGNOSIS — Z9109 Other allergy status, other than to drugs and biological substances: Secondary | ICD-10-CM | POA: Diagnosis not present

## 2018-09-02 DIAGNOSIS — Z9109 Other allergy status, other than to drugs and biological substances: Secondary | ICD-10-CM | POA: Diagnosis not present

## 2018-09-09 DIAGNOSIS — Z9109 Other allergy status, other than to drugs and biological substances: Secondary | ICD-10-CM | POA: Diagnosis not present

## 2018-09-14 DIAGNOSIS — E871 Hypo-osmolality and hyponatremia: Secondary | ICD-10-CM | POA: Diagnosis not present

## 2018-09-14 DIAGNOSIS — Z9109 Other allergy status, other than to drugs and biological substances: Secondary | ICD-10-CM | POA: Diagnosis not present

## 2018-09-16 DIAGNOSIS — N390 Urinary tract infection, site not specified: Secondary | ICD-10-CM | POA: Diagnosis not present

## 2018-09-16 DIAGNOSIS — R319 Hematuria, unspecified: Secondary | ICD-10-CM | POA: Diagnosis not present

## 2018-09-29 DIAGNOSIS — Z9109 Other allergy status, other than to drugs and biological substances: Secondary | ICD-10-CM | POA: Diagnosis not present

## 2018-10-06 DIAGNOSIS — Z9109 Other allergy status, other than to drugs and biological substances: Secondary | ICD-10-CM | POA: Diagnosis not present

## 2018-10-13 DIAGNOSIS — T7840XD Allergy, unspecified, subsequent encounter: Secondary | ICD-10-CM | POA: Diagnosis not present

## 2018-10-20 DIAGNOSIS — T7840XD Allergy, unspecified, subsequent encounter: Secondary | ICD-10-CM | POA: Diagnosis not present

## 2018-10-25 DIAGNOSIS — J3089 Other allergic rhinitis: Secondary | ICD-10-CM | POA: Diagnosis not present

## 2018-10-25 DIAGNOSIS — J301 Allergic rhinitis due to pollen: Secondary | ICD-10-CM | POA: Diagnosis not present

## 2018-10-27 DIAGNOSIS — T7840XD Allergy, unspecified, subsequent encounter: Secondary | ICD-10-CM | POA: Diagnosis not present

## 2018-11-03 DIAGNOSIS — T7840XD Allergy, unspecified, subsequent encounter: Secondary | ICD-10-CM | POA: Diagnosis not present

## 2018-11-10 DIAGNOSIS — Z9109 Other allergy status, other than to drugs and biological substances: Secondary | ICD-10-CM | POA: Diagnosis not present

## 2018-11-17 DIAGNOSIS — Z9109 Other allergy status, other than to drugs and biological substances: Secondary | ICD-10-CM | POA: Diagnosis not present

## 2018-11-24 DIAGNOSIS — Z9109 Other allergy status, other than to drugs and biological substances: Secondary | ICD-10-CM | POA: Diagnosis not present

## 2018-11-29 DIAGNOSIS — H401132 Primary open-angle glaucoma, bilateral, moderate stage: Secondary | ICD-10-CM | POA: Diagnosis not present

## 2018-11-29 DIAGNOSIS — E119 Type 2 diabetes mellitus without complications: Secondary | ICD-10-CM | POA: Diagnosis not present

## 2018-11-29 DIAGNOSIS — H04123 Dry eye syndrome of bilateral lacrimal glands: Secondary | ICD-10-CM | POA: Diagnosis not present

## 2018-12-01 DIAGNOSIS — Z9109 Other allergy status, other than to drugs and biological substances: Secondary | ICD-10-CM | POA: Diagnosis not present

## 2018-12-01 DIAGNOSIS — Z23 Encounter for immunization: Secondary | ICD-10-CM | POA: Diagnosis not present

## 2018-12-08 DIAGNOSIS — Z9109 Other allergy status, other than to drugs and biological substances: Secondary | ICD-10-CM | POA: Diagnosis not present

## 2018-12-15 DIAGNOSIS — T7840XD Allergy, unspecified, subsequent encounter: Secondary | ICD-10-CM | POA: Diagnosis not present

## 2018-12-29 DIAGNOSIS — T7840XD Allergy, unspecified, subsequent encounter: Secondary | ICD-10-CM | POA: Diagnosis not present

## 2019-01-05 DIAGNOSIS — T7840XD Allergy, unspecified, subsequent encounter: Secondary | ICD-10-CM | POA: Diagnosis not present

## 2019-01-05 DIAGNOSIS — Z9109 Other allergy status, other than to drugs and biological substances: Secondary | ICD-10-CM | POA: Diagnosis not present

## 2019-01-12 DIAGNOSIS — Z9109 Other allergy status, other than to drugs and biological substances: Secondary | ICD-10-CM | POA: Diagnosis not present

## 2019-01-19 DIAGNOSIS — Z9109 Other allergy status, other than to drugs and biological substances: Secondary | ICD-10-CM | POA: Diagnosis not present

## 2019-02-01 DIAGNOSIS — K219 Gastro-esophageal reflux disease without esophagitis: Secondary | ICD-10-CM | POA: Diagnosis not present

## 2019-02-01 DIAGNOSIS — K8689 Other specified diseases of pancreas: Secondary | ICD-10-CM | POA: Diagnosis not present

## 2019-02-01 DIAGNOSIS — E538 Deficiency of other specified B group vitamins: Secondary | ICD-10-CM | POA: Diagnosis not present

## 2019-02-01 DIAGNOSIS — E119 Type 2 diabetes mellitus without complications: Secondary | ICD-10-CM | POA: Diagnosis not present

## 2019-02-01 DIAGNOSIS — E039 Hypothyroidism, unspecified: Secondary | ICD-10-CM | POA: Diagnosis not present

## 2019-02-01 DIAGNOSIS — I10 Essential (primary) hypertension: Secondary | ICD-10-CM | POA: Diagnosis not present

## 2019-02-09 DIAGNOSIS — Q846 Other congenital malformations of nails: Secondary | ICD-10-CM | POA: Diagnosis not present

## 2019-02-09 DIAGNOSIS — R319 Hematuria, unspecified: Secondary | ICD-10-CM | POA: Diagnosis not present

## 2019-02-09 DIAGNOSIS — E119 Type 2 diabetes mellitus without complications: Secondary | ICD-10-CM | POA: Diagnosis not present

## 2019-02-09 DIAGNOSIS — Z9109 Other allergy status, other than to drugs and biological substances: Secondary | ICD-10-CM | POA: Diagnosis not present

## 2019-02-09 DIAGNOSIS — L602 Onychogryphosis: Secondary | ICD-10-CM | POA: Diagnosis not present

## 2019-02-16 DIAGNOSIS — Z9109 Other allergy status, other than to drugs and biological substances: Secondary | ICD-10-CM | POA: Diagnosis not present

## 2019-02-23 DIAGNOSIS — Z9109 Other allergy status, other than to drugs and biological substances: Secondary | ICD-10-CM | POA: Diagnosis not present

## 2019-03-02 DIAGNOSIS — R319 Hematuria, unspecified: Secondary | ICD-10-CM | POA: Diagnosis not present

## 2019-03-09 DIAGNOSIS — Z9109 Other allergy status, other than to drugs and biological substances: Secondary | ICD-10-CM | POA: Diagnosis not present

## 2019-03-16 DIAGNOSIS — Z9109 Other allergy status, other than to drugs and biological substances: Secondary | ICD-10-CM | POA: Diagnosis not present

## 2019-03-23 DIAGNOSIS — Z9109 Other allergy status, other than to drugs and biological substances: Secondary | ICD-10-CM | POA: Diagnosis not present

## 2019-03-30 DIAGNOSIS — Z9109 Other allergy status, other than to drugs and biological substances: Secondary | ICD-10-CM | POA: Diagnosis not present

## 2019-04-08 DIAGNOSIS — J301 Allergic rhinitis due to pollen: Secondary | ICD-10-CM | POA: Diagnosis not present

## 2019-04-08 DIAGNOSIS — J3089 Other allergic rhinitis: Secondary | ICD-10-CM | POA: Diagnosis not present

## 2019-04-13 DIAGNOSIS — Z9109 Other allergy status, other than to drugs and biological substances: Secondary | ICD-10-CM | POA: Diagnosis not present

## 2019-04-20 DIAGNOSIS — Z9109 Other allergy status, other than to drugs and biological substances: Secondary | ICD-10-CM | POA: Diagnosis not present

## 2019-04-25 DIAGNOSIS — H04123 Dry eye syndrome of bilateral lacrimal glands: Secondary | ICD-10-CM | POA: Diagnosis not present

## 2019-04-25 DIAGNOSIS — H401132 Primary open-angle glaucoma, bilateral, moderate stage: Secondary | ICD-10-CM | POA: Diagnosis not present

## 2019-04-27 DIAGNOSIS — Z9109 Other allergy status, other than to drugs and biological substances: Secondary | ICD-10-CM | POA: Diagnosis not present

## 2019-05-03 IMAGING — DX DG LUMBAR SPINE COMPLETE 4+V
5 series · 5 of 5 positions shown · non-contrast
Comparison: CT abdomen pelvis dated March 29, 2013.

CLINICAL DATA: Increasing lower back pain.

EXAM:
LUMBAR SPINE - COMPLETE 4+ VIEW

[l-spine ap]
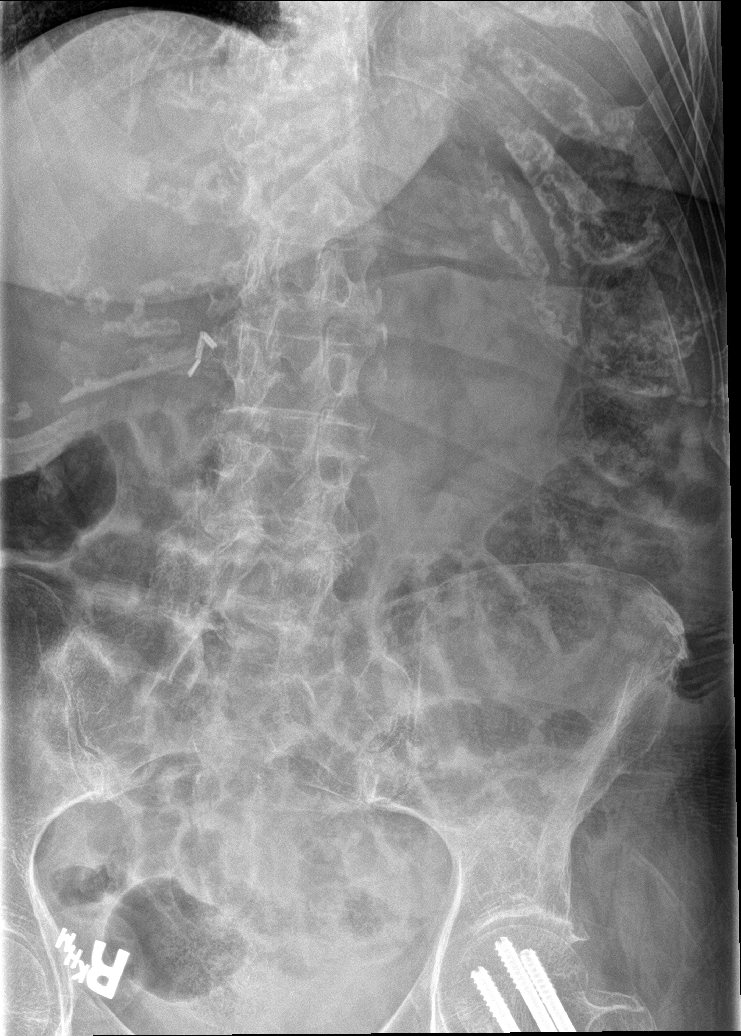

[l-spine obl (1 of 2)]
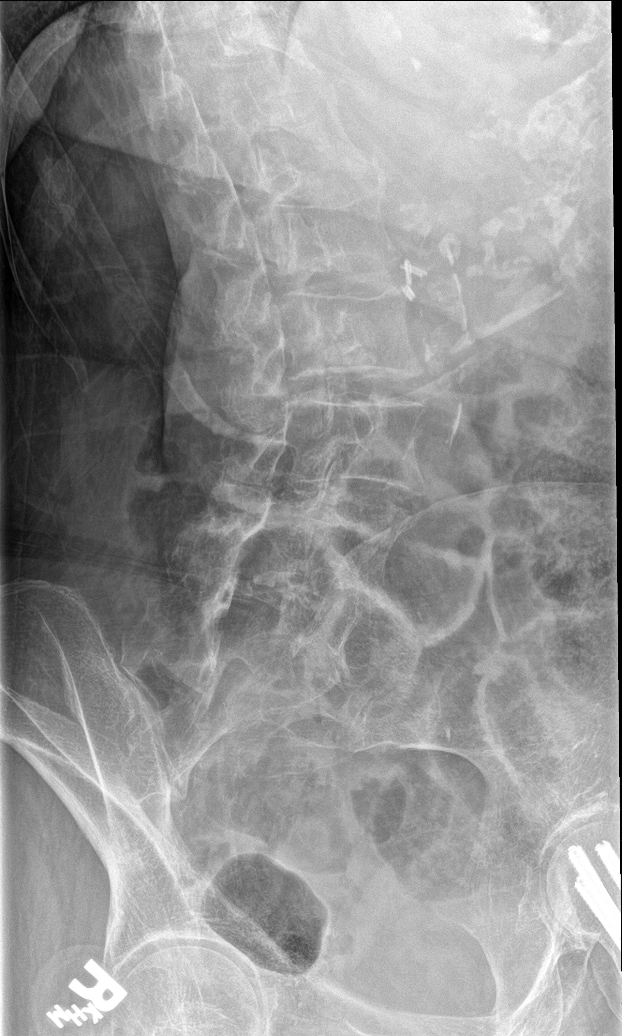

[l-spine obl (2 of 2)]
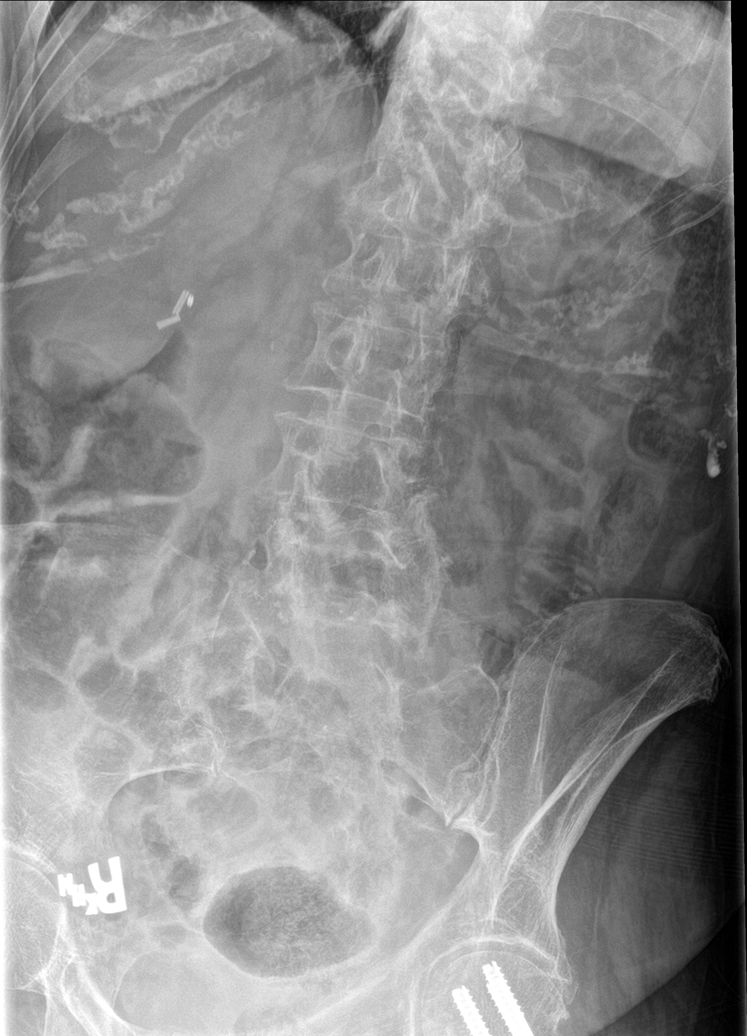

[l-spine lat]
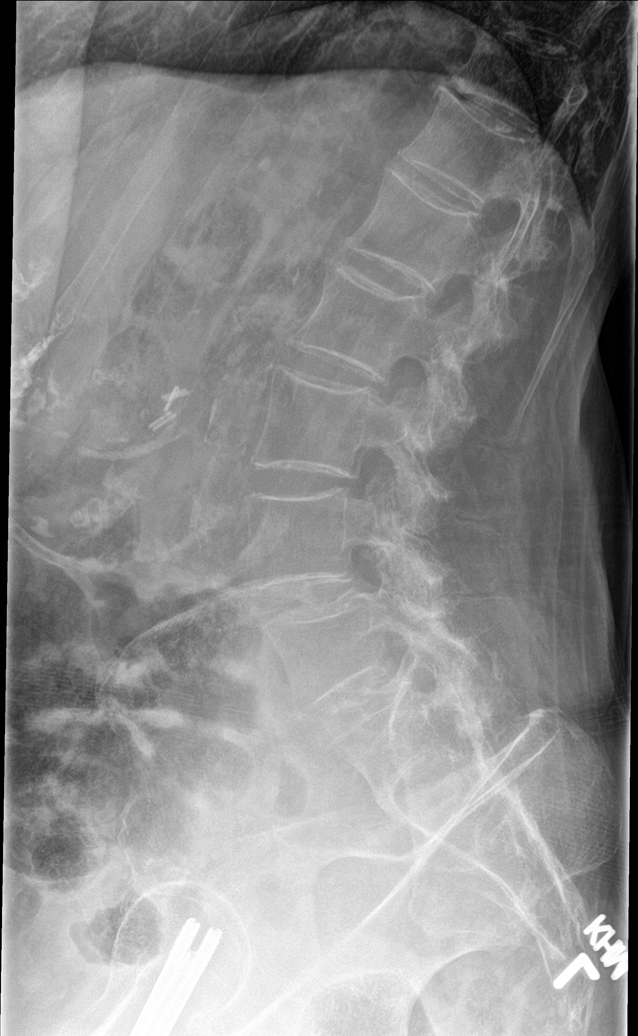

[l-spine spot]
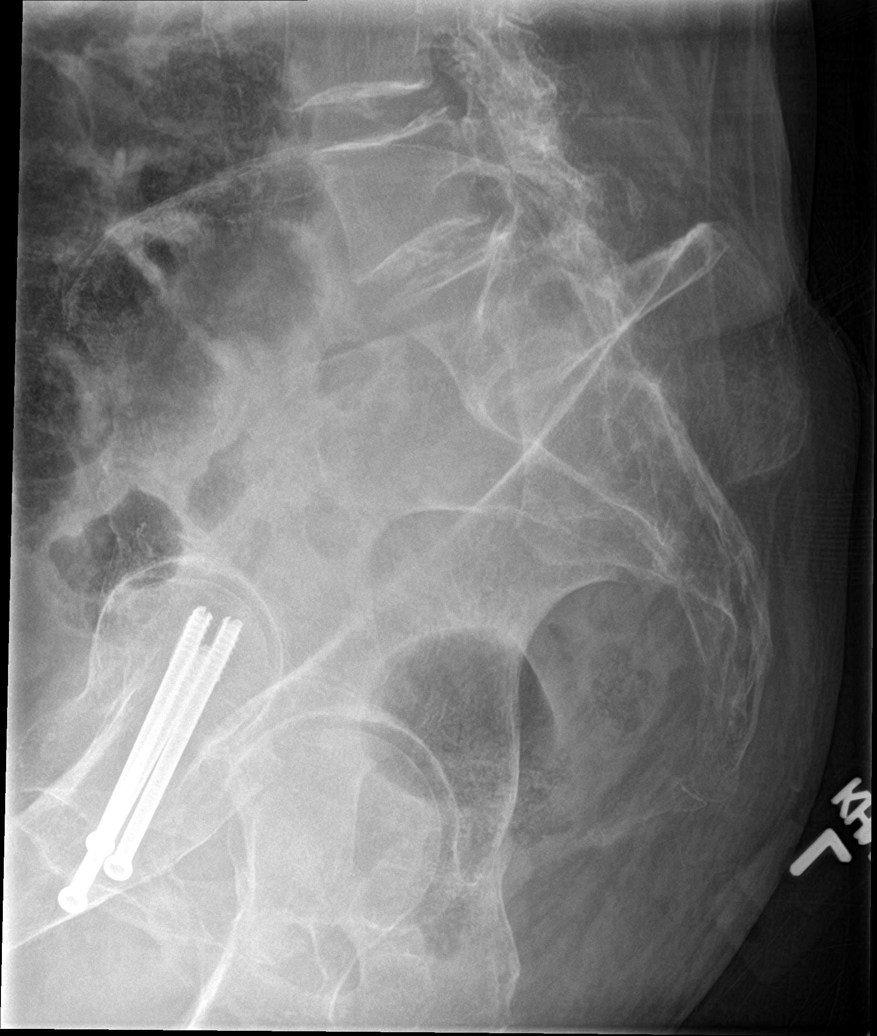

[5 of 5 positions shown; findings below may reference images not displayed]

FINDINGS: Lumbarization of S1. No acute fracture or subluxation. Vertebral
body heights are preserved. Unchanged trace anterolisthesis at L4-L5
and L5-S1 due to severe facet arthropathy. Mild disc height loss at
L5-S1. Remaining intervertebral disc spaces are relatively
maintained. Severe osteopenia. Prior cholecystectomy. Aortoiliac
atherosclerotic vascular disease. Prior pinning of the left femoral
neck.
IMPRESSION: 1.  No acute osseous abnormality.
2. Relatively unchanged lumbar spondylosis.

## 2019-05-04 DIAGNOSIS — Z9109 Other allergy status, other than to drugs and biological substances: Secondary | ICD-10-CM | POA: Diagnosis not present

## 2019-05-11 DIAGNOSIS — Z9109 Other allergy status, other than to drugs and biological substances: Secondary | ICD-10-CM | POA: Diagnosis not present

## 2019-05-25 DIAGNOSIS — Z9109 Other allergy status, other than to drugs and biological substances: Secondary | ICD-10-CM | POA: Diagnosis not present

## 2019-06-01 DIAGNOSIS — Z9109 Other allergy status, other than to drugs and biological substances: Secondary | ICD-10-CM | POA: Diagnosis not present

## 2019-06-01 DIAGNOSIS — L602 Onychogryphosis: Secondary | ICD-10-CM | POA: Diagnosis not present

## 2019-06-01 DIAGNOSIS — L84 Corns and callosities: Secondary | ICD-10-CM | POA: Diagnosis not present

## 2019-06-01 DIAGNOSIS — E119 Type 2 diabetes mellitus without complications: Secondary | ICD-10-CM | POA: Diagnosis not present

## 2019-06-08 DIAGNOSIS — Z9109 Other allergy status, other than to drugs and biological substances: Secondary | ICD-10-CM | POA: Diagnosis not present

## 2019-06-15 DIAGNOSIS — Z9109 Other allergy status, other than to drugs and biological substances: Secondary | ICD-10-CM | POA: Diagnosis not present

## 2019-06-22 DIAGNOSIS — Z9109 Other allergy status, other than to drugs and biological substances: Secondary | ICD-10-CM | POA: Diagnosis not present

## 2019-06-29 DIAGNOSIS — Z9109 Other allergy status, other than to drugs and biological substances: Secondary | ICD-10-CM | POA: Diagnosis not present

## 2019-07-06 DIAGNOSIS — T7840XD Allergy, unspecified, subsequent encounter: Secondary | ICD-10-CM | POA: Diagnosis not present

## 2019-07-13 DIAGNOSIS — T7840XD Allergy, unspecified, subsequent encounter: Secondary | ICD-10-CM | POA: Diagnosis not present

## 2019-07-20 DIAGNOSIS — Z9109 Other allergy status, other than to drugs and biological substances: Secondary | ICD-10-CM | POA: Diagnosis not present

## 2019-07-27 DIAGNOSIS — Z9109 Other allergy status, other than to drugs and biological substances: Secondary | ICD-10-CM | POA: Diagnosis not present

## 2019-07-27 DIAGNOSIS — T783XXD Angioneurotic edema, subsequent encounter: Secondary | ICD-10-CM | POA: Diagnosis not present

## 2019-07-27 DIAGNOSIS — J301 Allergic rhinitis due to pollen: Secondary | ICD-10-CM | POA: Diagnosis not present

## 2019-07-27 DIAGNOSIS — L509 Urticaria, unspecified: Secondary | ICD-10-CM | POA: Diagnosis not present

## 2019-07-27 DIAGNOSIS — J3089 Other allergic rhinitis: Secondary | ICD-10-CM | POA: Diagnosis not present

## 2019-08-09 DIAGNOSIS — E7800 Pure hypercholesterolemia, unspecified: Secondary | ICD-10-CM | POA: Diagnosis not present

## 2019-08-09 DIAGNOSIS — E119 Type 2 diabetes mellitus without complications: Secondary | ICD-10-CM | POA: Diagnosis not present

## 2019-08-09 DIAGNOSIS — E538 Deficiency of other specified B group vitamins: Secondary | ICD-10-CM | POA: Diagnosis not present

## 2019-08-09 DIAGNOSIS — Z7984 Long term (current) use of oral hypoglycemic drugs: Secondary | ICD-10-CM | POA: Diagnosis not present

## 2019-08-09 DIAGNOSIS — T7840XD Allergy, unspecified, subsequent encounter: Secondary | ICD-10-CM | POA: Diagnosis not present

## 2019-08-09 DIAGNOSIS — Z0000 Encounter for general adult medical examination without abnormal findings: Secondary | ICD-10-CM | POA: Diagnosis not present

## 2019-08-09 DIAGNOSIS — I10 Essential (primary) hypertension: Secondary | ICD-10-CM | POA: Diagnosis not present

## 2019-08-09 DIAGNOSIS — E039 Hypothyroidism, unspecified: Secondary | ICD-10-CM | POA: Diagnosis not present

## 2019-08-09 DIAGNOSIS — K219 Gastro-esophageal reflux disease without esophagitis: Secondary | ICD-10-CM | POA: Diagnosis not present

## 2019-08-16 DIAGNOSIS — J301 Allergic rhinitis due to pollen: Secondary | ICD-10-CM | POA: Diagnosis not present

## 2019-08-16 DIAGNOSIS — J3089 Other allergic rhinitis: Secondary | ICD-10-CM | POA: Diagnosis not present

## 2019-08-16 DIAGNOSIS — T783XXD Angioneurotic edema, subsequent encounter: Secondary | ICD-10-CM | POA: Diagnosis not present

## 2019-08-16 DIAGNOSIS — L509 Urticaria, unspecified: Secondary | ICD-10-CM | POA: Diagnosis not present

## 2019-08-26 DIAGNOSIS — J3089 Other allergic rhinitis: Secondary | ICD-10-CM | POA: Diagnosis not present

## 2019-08-26 DIAGNOSIS — L509 Urticaria, unspecified: Secondary | ICD-10-CM | POA: Diagnosis not present

## 2019-08-26 DIAGNOSIS — T783XXD Angioneurotic edema, subsequent encounter: Secondary | ICD-10-CM | POA: Diagnosis not present

## 2019-08-26 DIAGNOSIS — J301 Allergic rhinitis due to pollen: Secondary | ICD-10-CM | POA: Diagnosis not present

## 2019-08-29 DIAGNOSIS — E119 Type 2 diabetes mellitus without complications: Secondary | ICD-10-CM | POA: Diagnosis not present

## 2019-08-29 DIAGNOSIS — H401132 Primary open-angle glaucoma, bilateral, moderate stage: Secondary | ICD-10-CM | POA: Diagnosis not present

## 2019-08-29 DIAGNOSIS — H04123 Dry eye syndrome of bilateral lacrimal glands: Secondary | ICD-10-CM | POA: Diagnosis not present

## 2019-10-14 ENCOUNTER — Other Ambulatory Visit: Payer: Self-pay

## 2019-10-14 ENCOUNTER — Emergency Department (HOSPITAL_COMMUNITY): Payer: PPO

## 2019-10-14 ENCOUNTER — Inpatient Hospital Stay (HOSPITAL_COMMUNITY)
Admission: EM | Admit: 2019-10-14 | Discharge: 2019-10-19 | DRG: 065 | Disposition: A | Discharge status: Skilled Nursing Facility | Payer: PPO | Attending: Internal Medicine | Admitting: Internal Medicine

## 2019-10-14 ENCOUNTER — Encounter (HOSPITAL_COMMUNITY): Payer: Self-pay

## 2019-10-14 DIAGNOSIS — I69122 Dysarthria following nontraumatic intracerebral hemorrhage: Secondary | ICD-10-CM | POA: Diagnosis not present

## 2019-10-14 DIAGNOSIS — E876 Hypokalemia: Secondary | ICD-10-CM | POA: Diagnosis not present

## 2019-10-14 DIAGNOSIS — K219 Gastro-esophageal reflux disease without esophagitis: Secondary | ICD-10-CM | POA: Diagnosis not present

## 2019-10-14 DIAGNOSIS — M1990 Unspecified osteoarthritis, unspecified site: Secondary | ICD-10-CM | POA: Diagnosis not present

## 2019-10-14 DIAGNOSIS — Z7984 Long term (current) use of oral hypoglycemic drugs: Secondary | ICD-10-CM

## 2019-10-14 DIAGNOSIS — Z833 Family history of diabetes mellitus: Secondary | ICD-10-CM

## 2019-10-14 DIAGNOSIS — Z888 Allergy status to other drugs, medicaments and biological substances status: Secondary | ICD-10-CM

## 2019-10-14 DIAGNOSIS — M6281 Muscle weakness (generalized): Secondary | ICD-10-CM | POA: Diagnosis not present

## 2019-10-14 DIAGNOSIS — M2550 Pain in unspecified joint: Secondary | ICD-10-CM | POA: Diagnosis not present

## 2019-10-14 DIAGNOSIS — Z803 Family history of malignant neoplasm of breast: Secondary | ICD-10-CM

## 2019-10-14 DIAGNOSIS — Z79899 Other long term (current) drug therapy: Secondary | ICD-10-CM

## 2019-10-14 DIAGNOSIS — E119 Type 2 diabetes mellitus without complications: Secondary | ICD-10-CM

## 2019-10-14 DIAGNOSIS — R471 Dysarthria and anarthria: Secondary | ICD-10-CM | POA: Diagnosis present

## 2019-10-14 DIAGNOSIS — I10 Essential (primary) hypertension: Secondary | ICD-10-CM | POA: Diagnosis present

## 2019-10-14 DIAGNOSIS — E785 Hyperlipidemia, unspecified: Secondary | ICD-10-CM | POA: Diagnosis not present

## 2019-10-14 DIAGNOSIS — Z66 Do not resuscitate: Secondary | ICD-10-CM | POA: Diagnosis not present

## 2019-10-14 DIAGNOSIS — H9193 Unspecified hearing loss, bilateral: Secondary | ICD-10-CM | POA: Diagnosis not present

## 2019-10-14 DIAGNOSIS — H9190 Unspecified hearing loss, unspecified ear: Secondary | ICD-10-CM | POA: Diagnosis not present

## 2019-10-14 DIAGNOSIS — Z880 Allergy status to penicillin: Secondary | ICD-10-CM

## 2019-10-14 DIAGNOSIS — Z881 Allergy status to other antibiotic agents status: Secondary | ICD-10-CM

## 2019-10-14 DIAGNOSIS — E039 Hypothyroidism, unspecified: Secondary | ICD-10-CM | POA: Diagnosis not present

## 2019-10-14 DIAGNOSIS — R29818 Other symptoms and signs involving the nervous system: Secondary | ICD-10-CM | POA: Diagnosis not present

## 2019-10-14 DIAGNOSIS — R29810 Facial weakness: Secondary | ICD-10-CM | POA: Diagnosis not present

## 2019-10-14 DIAGNOSIS — G8191 Hemiplegia, unspecified affecting right dominant side: Secondary | ICD-10-CM | POA: Diagnosis not present

## 2019-10-14 DIAGNOSIS — Z741 Need for assistance with personal care: Secondary | ICD-10-CM | POA: Diagnosis not present

## 2019-10-14 DIAGNOSIS — Z79890 Hormone replacement therapy (postmenopausal): Secondary | ICD-10-CM

## 2019-10-14 DIAGNOSIS — I6523 Occlusion and stenosis of bilateral carotid arteries: Secondary | ICD-10-CM | POA: Diagnosis not present

## 2019-10-14 DIAGNOSIS — Z20822 Contact with and (suspected) exposure to covid-19: Secondary | ICD-10-CM | POA: Diagnosis present

## 2019-10-14 DIAGNOSIS — I619 Nontraumatic intracerebral hemorrhage, unspecified: Secondary | ICD-10-CM | POA: Diagnosis present

## 2019-10-14 DIAGNOSIS — K5790 Diverticulosis of intestine, part unspecified, without perforation or abscess without bleeding: Secondary | ICD-10-CM | POA: Diagnosis not present

## 2019-10-14 DIAGNOSIS — M159 Polyosteoarthritis, unspecified: Secondary | ICD-10-CM | POA: Diagnosis not present

## 2019-10-14 DIAGNOSIS — I639 Cerebral infarction, unspecified: Secondary | ICD-10-CM | POA: Diagnosis not present

## 2019-10-14 DIAGNOSIS — H409 Unspecified glaucoma: Secondary | ICD-10-CM | POA: Diagnosis not present

## 2019-10-14 DIAGNOSIS — R29704 NIHSS score 4: Secondary | ICD-10-CM | POA: Diagnosis present

## 2019-10-14 DIAGNOSIS — R1312 Dysphagia, oropharyngeal phase: Secondary | ICD-10-CM | POA: Diagnosis not present

## 2019-10-14 DIAGNOSIS — R41841 Cognitive communication deficit: Secondary | ICD-10-CM | POA: Diagnosis not present

## 2019-10-14 DIAGNOSIS — I672 Cerebral atherosclerosis: Secondary | ICD-10-CM | POA: Diagnosis present

## 2019-10-14 DIAGNOSIS — H401134 Primary open-angle glaucoma, bilateral, indeterminate stage: Secondary | ICD-10-CM | POA: Diagnosis not present

## 2019-10-14 DIAGNOSIS — Z7982 Long term (current) use of aspirin: Secondary | ICD-10-CM

## 2019-10-14 DIAGNOSIS — R4781 Slurred speech: Secondary | ICD-10-CM | POA: Diagnosis not present

## 2019-10-14 DIAGNOSIS — E1169 Type 2 diabetes mellitus with other specified complication: Secondary | ICD-10-CM | POA: Diagnosis present

## 2019-10-14 DIAGNOSIS — E871 Hypo-osmolality and hyponatremia: Secondary | ICD-10-CM | POA: Diagnosis not present

## 2019-10-14 DIAGNOSIS — I6623 Occlusion and stenosis of bilateral posterior cerebral arteries: Secondary | ICD-10-CM | POA: Diagnosis not present

## 2019-10-14 DIAGNOSIS — K5730 Diverticulosis of large intestine without perforation or abscess without bleeding: Secondary | ICD-10-CM | POA: Diagnosis not present

## 2019-10-14 DIAGNOSIS — I69951 Hemiplegia and hemiparesis following unspecified cerebrovascular disease affecting right dominant side: Secondary | ICD-10-CM | POA: Diagnosis not present

## 2019-10-14 DIAGNOSIS — K861 Other chronic pancreatitis: Secondary | ICD-10-CM | POA: Diagnosis not present

## 2019-10-14 DIAGNOSIS — R531 Weakness: Secondary | ICD-10-CM | POA: Diagnosis not present

## 2019-10-14 DIAGNOSIS — Z808 Family history of malignant neoplasm of other organs or systems: Secondary | ICD-10-CM

## 2019-10-14 DIAGNOSIS — Z8041 Family history of malignant neoplasm of ovary: Secondary | ICD-10-CM

## 2019-10-14 DIAGNOSIS — I69391 Dysphagia following cerebral infarction: Secondary | ICD-10-CM | POA: Diagnosis not present

## 2019-10-14 DIAGNOSIS — I6302 Cerebral infarction due to thrombosis of basilar artery: Secondary | ICD-10-CM | POA: Diagnosis not present

## 2019-10-14 DIAGNOSIS — I6389 Other cerebral infarction: Secondary | ICD-10-CM | POA: Diagnosis not present

## 2019-10-14 DIAGNOSIS — J3089 Other allergic rhinitis: Secondary | ICD-10-CM | POA: Diagnosis not present

## 2019-10-14 DIAGNOSIS — R404 Transient alteration of awareness: Secondary | ICD-10-CM | POA: Diagnosis not present

## 2019-10-14 DIAGNOSIS — Z7401 Bed confinement status: Secondary | ICD-10-CM | POA: Diagnosis not present

## 2019-10-14 DIAGNOSIS — E7800 Pure hypercholesterolemia, unspecified: Secondary | ICD-10-CM | POA: Diagnosis present

## 2019-10-14 DIAGNOSIS — Z9181 History of falling: Secondary | ICD-10-CM | POA: Diagnosis not present

## 2019-10-14 DIAGNOSIS — R279 Unspecified lack of coordination: Secondary | ICD-10-CM | POA: Diagnosis not present

## 2019-10-14 DIAGNOSIS — R5381 Other malaise: Secondary | ICD-10-CM | POA: Diagnosis not present

## 2019-10-14 LAB — COMPREHENSIVE METABOLIC PANEL
ALT: 16 U/L (ref 0–44)
AST: 24 U/L (ref 15–41)
Albumin: 4.1 g/dL (ref 3.5–5.0)
Alkaline Phosphatase: 74 U/L (ref 38–126)
Anion gap: 12 (ref 5–15)
BUN: 14 mg/dL (ref 8–23)
CO2: 20 mmol/L — ABNORMAL LOW (ref 22–32)
Calcium: 9.1 mg/dL (ref 8.9–10.3)
Chloride: 100 mmol/L (ref 98–111)
Creatinine, Ser: 0.59 mg/dL (ref 0.44–1.00)
GFR calc Af Amer: >60 mL/min (ref >60)
GFR calc non Af Amer: >60 mL/min (ref >60)
Glucose, Bld: 160 mg/dL — ABNORMAL HIGH (ref 70–99)
Potassium: 3.7 mmol/L (ref 3.5–5.1)
Sodium: 132 mmol/L — ABNORMAL LOW (ref 135–145)
Total Bilirubin: 0.6 mg/dL (ref 0.3–1.2)
Total Protein: 7.1 g/dL (ref 6.5–8.1)

## 2019-10-14 LAB — MAGNESIUM: Magnesium: 1.9 mg/dL (ref 1.7–2.4)

## 2019-10-14 LAB — DIFFERENTIAL
Abs Immature Granulocytes: 0.02 10*3/uL (ref 0.00–0.07)
Basophils Absolute: 0 10*3/uL (ref 0.0–0.1)
Basophils Relative: 0 %
Eosinophils Absolute: 0 10*3/uL (ref 0.0–0.5)
Eosinophils Relative: 0 %
Immature Granulocytes: 0 %
Lymphocytes Relative: 17 %
Lymphs Abs: 1.5 10*3/uL (ref 0.7–4.0)
Monocytes Absolute: 0.8 10*3/uL (ref 0.1–1.0)
Monocytes Relative: 9 %
Neutro Abs: 6.3 10*3/uL (ref 1.7–7.7)
Neutrophils Relative %: 74 %

## 2019-10-14 LAB — CBC
HCT: 40.5 % (ref 36.0–46.0)
Hemoglobin: 13.8 g/dL (ref 12.0–15.0)
MCH: 30.9 pg (ref 26.0–34.0)
MCHC: 34.1 g/dL (ref 30.0–36.0)
MCV: 90.8 fL (ref 80.0–100.0)
Platelets: 252 10*3/uL (ref 150–400)
RBC: 4.46 MIL/uL (ref 3.87–5.11)
RDW: 14.2 % (ref 11.5–15.5)
WBC: 8.6 10*3/uL (ref 4.0–10.5)
nRBC: 0 % (ref 0.0–0.2)

## 2019-10-14 LAB — RAPID URINE DRUG SCREEN, HOSP PERFORMED
Amphetamines: NOT DETECTED
Barbiturates: NOT DETECTED
Benzodiazepines: NOT DETECTED
Cocaine: NOT DETECTED
Opiates: NOT DETECTED
Tetrahydrocannabinol: NOT DETECTED

## 2019-10-14 LAB — URINALYSIS, ROUTINE W REFLEX MICROSCOPIC
Bacteria, UA: NONE SEEN
Bilirubin Urine: NEGATIVE
Glucose, UA: NEGATIVE mg/dL
Ketones, ur: NEGATIVE mg/dL
Nitrite: NEGATIVE
Protein, ur: NEGATIVE mg/dL
Specific Gravity, Urine: 1.034 — ABNORMAL HIGH (ref 1.005–1.030)
pH: 7 (ref 5.0–8.0)

## 2019-10-14 LAB — ETHANOL: Alcohol, Ethyl (B): <10 mg/dL (ref <10)

## 2019-10-14 LAB — I-STAT CHEM 8, ED
BUN: 15 mg/dL (ref 8–23)
Calcium, Ion: 1.06 mmol/L — ABNORMAL LOW (ref 1.15–1.40)
Chloride: 101 mmol/L (ref 98–111)
Creatinine, Ser: 0.6 mg/dL (ref 0.44–1.00)
Glucose, Bld: 158 mg/dL — ABNORMAL HIGH (ref 70–99)
HCT: 41 % (ref 36.0–46.0)
Hemoglobin: 13.9 g/dL (ref 12.0–15.0)
Potassium: 4.6 mmol/L (ref 3.5–5.1)
Sodium: 134 mmol/L — ABNORMAL LOW (ref 135–145)
TCO2: 22 mmol/L (ref 22–32)

## 2019-10-14 LAB — SARS CORONAVIRUS 2 BY RT PCR (HOSPITAL ORDER, PERFORMED IN ~~LOC~~ HOSPITAL LAB): SARS Coronavirus 2: NEGATIVE

## 2019-10-14 LAB — APTT: aPTT: 38 seconds — ABNORMAL HIGH (ref 24–36)

## 2019-10-14 LAB — PROTIME-INR
INR: 1 (ref 0.8–1.2)
Prothrombin Time: 12.9 seconds (ref 11.4–15.2)

## 2019-10-14 LAB — CBG MONITORING, ED: Glucose-Capillary: 155 mg/dL — ABNORMAL HIGH (ref 70–99)

## 2019-10-14 MED ORDER — LEVOTHYROXINE SODIUM 100 MCG/5ML IV SOLN
50.0000 ug | Freq: Every day | INTRAVENOUS | Status: DC
  Administered 2019-10-15 – 2019-10-19 (×5): 50 ug via INTRAVENOUS
  Filled 2019-10-14 (×7): qty 5

## 2019-10-14 MED ORDER — POLYVINYL ALCOHOL 1.4 % OP SOLN
1.0000 [drp] | Freq: Three times a day (TID) | OPHTHALMIC | Status: DC | PRN
  Filled 2019-10-14: qty 15

## 2019-10-14 MED ORDER — ACETAMINOPHEN 325 MG PO TABS: 650.0000 mg | ORAL_TABLET | ORAL | Status: DC | PRN

## 2019-10-14 MED ORDER — IOHEXOL 350 MG/ML SOLN
100.0000 mL | Freq: Once | INTRAVENOUS | Status: AC | PRN
  Administered 2019-10-14: 100 mL via INTRAVENOUS

## 2019-10-14 MED ORDER — TIMOLOL MALEATE 0.5 % OP SOLN
1.0000 [drp] | Freq: Two times a day (BID) | OPHTHALMIC | Status: DC
  Administered 2019-10-14 – 2019-10-19 (×9): 1 [drp] via OPHTHALMIC
  Filled 2019-10-14 (×3): qty 5

## 2019-10-14 MED ORDER — ONDANSETRON HCL 4 MG/2ML IJ SOLN
INTRAMUSCULAR | Status: AC
  Filled 2019-10-14: qty 2

## 2019-10-14 MED ORDER — SODIUM CHLORIDE 0.9 % IV SOLN: INTRAVENOUS | Status: DC

## 2019-10-14 MED ORDER — ACETAMINOPHEN 160 MG/5ML PO SOLN
650.0000 mg | ORAL | Status: DC | PRN
  Administered 2019-10-18: 650 mg
  Filled 2019-10-14: qty 20.3

## 2019-10-14 MED ORDER — DORZOLAMIDE HCL 2 % OP SOLN
1.0000 [drp] | Freq: Two times a day (BID) | OPHTHALMIC | Status: DC
  Administered 2019-10-14 – 2019-10-19 (×9): 1 [drp] via OPHTHALMIC
  Filled 2019-10-14 (×3): qty 10

## 2019-10-14 MED ORDER — ACETAMINOPHEN 650 MG RE SUPP: 650.0000 mg | RECTAL | Status: DC | PRN

## 2019-10-14 MED ORDER — STROKE: EARLY STAGES OF RECOVERY BOOK
Freq: Once | Status: DC
  Filled 2019-10-14: qty 1

## 2019-10-14 MED ORDER — LATANOPROST 0.005 % OP SOLN
1.0000 [drp] | Freq: Every day | OPHTHALMIC | Status: DC
  Administered 2019-10-14 – 2019-10-18 (×2): 1 [drp] via OPHTHALMIC
  Filled 2019-10-14 (×3): qty 2.5

## 2019-10-14 MED ORDER — ASPIRIN 300 MG RE SUPP
300.0000 mg | Freq: Every day | RECTAL | Status: DC
  Administered 2019-10-15 – 2019-10-16 (×2): 300 mg via RECTAL
  Filled 2019-10-14 (×2): qty 1

## 2019-10-14 MED ORDER — POTASSIUM CHLORIDE 10 MEQ/100ML IV SOLN
10.0000 meq | INTRAVENOUS | Status: AC
  Administered 2019-10-14 – 2019-10-15 (×3): 10 meq via INTRAVENOUS
  Filled 2019-10-14 (×3): qty 100

## 2019-10-14 MED ORDER — ASPIRIN 300 MG RE SUPP
300.0000 mg | Freq: Once | RECTAL | Status: AC
  Administered 2019-10-14: 300 mg via RECTAL
  Filled 2019-10-14: qty 1

## 2019-10-14 MED ORDER — DORZOLAMIDE HCL-TIMOLOL MAL PF 22.3-6.8 MG/ML OP SOLN: 1.0000 [drp] | Freq: Two times a day (BID) | OPHTHALMIC | Status: DC

## 2019-10-14 MED ORDER — ASPIRIN 325 MG PO TABS: 325.0000 mg | ORAL_TABLET | Freq: Every day | ORAL | Status: DC

## 2019-10-14 MED ORDER — HEPARIN SODIUM (PORCINE) 5000 UNIT/ML IJ SOLN
5000.0000 [IU] | Freq: Three times a day (TID) | INTRAMUSCULAR | Status: DC
  Administered 2019-10-14 – 2019-10-19 (×15): 5000 [IU] via SUBCUTANEOUS
  Filled 2019-10-14 (×15): qty 1

## 2019-10-14 NOTE — ED Provider Notes (Signed)
[facility] EMERGENCY DEPARTMENT Provider Note   CSN: [id number] Arrival date & time: 10/14/19  1814  An emergency department physician performed an initial assessment on this suspected stroke patient at 1815.  History Chief Complaint  Patient presents with  . Code Stroke    Kathleen [name] is a 86 y.o. female.  Patient brought in by EMS.  Patient last to be definitely normal yesterday.  Apparently patient's daughter went to the house at around 1500 today.  And it noted that something was wrong with her mother.  That she seemed to have some facial droop and seem to have some right-sided weakness.  EMS noted that she started to have more slurred speech when they were there.  EMS was not clear exactly on last time normal.  We did attempt to try to contact daughter granddaughter.  Teleneurologist also attempted to do that.  But based on her presentation code stroke was initiated.  Head CT without acute findings.        Past Medical History:  Diagnosis Date  . Arthritis   . Diabetes mellitus without complication ([place])    "borderline"  . Diverticulosis of colon   . High cholesterol   . Hypertension   . Hypothyroidism   . Pancreatitis     Patient Active Problem List   Diagnosis Date Noted  . CVA (cerebrovascular accident due to intracerebral hemorrhage) ([place]) 10/14/2019  . Leg swelling 05/07/2018  . Chronic non-seasonal allergic rhinitis 04/28/2018  . GERD without esophagitis 04/28/2018  . Hyperlipidemia associated with type 2 diabetes mellitus ([place]) 04/28/2018  . Chronic open angle glaucoma 04/28/2018  . Constipation in female 04/28/2018  . Hyponatremia 04/25/2018  . Hallucinations 04/25/2018  . Acute bronchitis 04/24/2018  . CD (contact dermatitis) 04/24/2018  . Dermatitis, eczematoid 04/24/2018  . Allergic reaction 05/22/2016  . Fracture   . Controlled type 2 diabetes mellitus without complication, without long-term current use of insulin ([place]) 01/16/2015  . Hip  fracture, left ([place]) 01/15/2015  . Varicose veins of leg with complications 82/50/5397  . Exocrine pancreatic insufficiency ([place]) 11/03/2013  . Chronic pancreatitis ([place]) 11/03/2013  . Pancreatitis 01/27/2012  . Hypokalemia 01/27/2012  . Hypertension associated with type 2 diabetes mellitus ([place])   . Hypothyroidism     Past Surgical History:  Procedure Laterality Date  . ABDOMINAL HYSTERECTOMY     partial  . APPENDECTOMY    . bladder tac    . CHOLECYSTECTOMY  1999   cholelithiasis  . COLONOSCOPY  01/2007   Dr Olevia Perches: normal  . COLONOSCOPY  12/2000   Dr Deatra Ina- diverticulosis  . ESOPHAGOGASTRODUODENOSCOPY     Dr Damaris Hippo cannot remember  . EUS N/A 03/24/2012   Dr. Paulita Fujita: suspected passed CBD stone as cause of acute pancreatitis, suspective chronic pancreatitis.   Marland Kitchen EXCISION OF BREAST BIOPSY    . HIP PINNING,CANNULATED Left 01/15/2015   Procedure: CANNULATED HIP PINNING/LEFT;  Surgeon: Renette Butters, MD;  Location: [facility];  Service: Orthopedics;  Laterality: Left;  Marland Kitchen VEIN SURGERY       OB History    Gravida  2   Para  2   Term  2   Preterm      AB      Living  1     SAB      TAB      Ectopic      Multiple      Live Births  Family History  Problem Relation Age of Onset  . Diabetes Father   . Diabetes Mother   . Cancer Mother        ovarian  . Cancer Sister 57       breast  . Cancer Sister 28       bone    Social History   Tobacco Use  . Smoking status: Never Smoker  . Smokeless tobacco: Never Used  Substance Use Topics  . Alcohol use: No    Alcohol/week: 0.0 standard drinks  . Drug use: No    Home Medications Prior to Admission medications   Medication Sig Start Date End Date Taking? Authorizing Provider  amLODipine (NORVASC) 5 MG tablet Take 5 mg by mouth every morning.     [provider]  aspirin EC 81 MG tablet Take 81 mg by mouth at bedtime.     [provider]  calcium carbonate (CALTRATE 600)  1500 (600 Ca) MG TABS tablet Take 1 tablet (1,500 mg total) by mouth 2 (two) times daily with a meal. 04/27/18   Kathie Dike, MD  carboxymethylcellulose (REFRESH PLUS) 0.5 % SOLN Place 1 drop into both eyes 3 (three) times daily as needed (for dry eyes).     [provider]  COSOPT PF 22.3-6.8 MG/ML SOLN Place 1 drop into both eyes 2 (two) times daily.  10/11/13   [provider]  docusate sodium (COLACE) 100 MG capsule Take 100 mg by mouth 2 (two) times daily. 04/28/18   [provider]  fluticasone (FLONASE) 50 MCG/ACT nasal spray Place 1 spray into both nostrils daily as needed for allergies.     [provider]  furosemide (LASIX) 20 MG tablet Take 20 mg by mouth daily. 05/07/18   [provider]  latanoprost (XALATAN) 0.005 % ophthalmic solution Place 1 drop into both eyes at bedtime. 04/27/18   Kathie Dike, MD  levocetirizine (XYZAL) 5 MG tablet Take 5 mg by mouth every evening.    [provider]  levothyroxine (SYNTHROID, LEVOTHROID) 100 MCG tablet Take 1 tablet (100 mcg total) by mouth daily. 04/27/18   Kathie Dike, MD  lisinopril (PRINIVIL,ZESTRIL) 2.5 MG tablet Take 2.5 mg by mouth daily.     [provider]  loperamide (IMODIUM) 2 MG capsule Take 2 mg by mouth daily as needed for diarrhea or loose stools.  04/27/18   [provider]  metFORMIN (GLUCOPHAGE-XR) 500 MG 24 hr tablet Take 500 mg by mouth daily with breakfast.     [provider]  Multiple Vitamin (MULTIVITAMIN WITH MINERALS) TABS tablet Take 1 tablet by mouth daily.    [provider]  Omega-3 Fatty Acids (FISH OIL) 1000 MG CAPS Take 1 capsule by mouth daily.  04/27/18   [provider]  potassium chloride SA (K-DUR,KLOR-CON) 20 MEQ tablet Take 20 mEq by mouth daily. 05/11/18   [provider]  ranitidine (ZANTAC) 150 MG tablet Take 150 mg by mouth 2 (two) times daily.    [provider]  ZENPEP 20000-63000  units CPEP TAKE ONE CAPSULE BY MOUTH THREE TIMES DAILY BEFORE MEALS Patient not taking: Reported on 05/18/2018 06/19/16   Carlis Stable, NP    Allergies    Alendronate, Cephalexin, Clindamycin/lincomycin, Creon [pancreatin], Doxycycline, Florastor [yeast], Penicillins, and Zenpep [pancrelipase (lip-prot-amyl)]  Review of Systems   Review of Systems  Unable to perform ROS: Patient nonverbal    Physical Exam Updated Vital Signs BP 126/71   Pulse 72  Resp 18   SpO2 96%   Physical Exam Vitals and nursing note reviewed.  Constitutional:      General: She is not in acute distress.    Appearance: Normal appearance. She is well-developed.  HENT:     Head: Normocephalic and atraumatic.  Eyes:     Extraocular Movements: Extraocular movements intact.     Conjunctiva/sclera: Conjunctivae normal.     Pupils: Pupils are equal, round, and reactive to light.  Cardiovascular:     Rate and Rhythm: Normal rate and regular rhythm.     Heart sounds: No murmur heard.   Pulmonary:     Effort: Pulmonary effort is normal. No respiratory distress.     Breath sounds: Normal breath sounds.  Abdominal:     Palpations: Abdomen is soft.     Tenderness: There is no abdominal tenderness.  Musculoskeletal:        General: No swelling. Normal range of motion.     Cervical back: Normal range of motion and neck supple.  Skin:    General: Skin is warm and dry.  Neurological:     Mental Status: She is alert.     Comments: Patient awake.  Had a right facial droop.  Significant to right upper extremity weakness also had some right lower extremity weakness.  Patient with very slurred speech.  Difficulty forming words.     ED Results / Procedures / Treatments   Labs (all labs ordered are listed, but only abnormal results are displayed) Labs Reviewed  APTT - Abnormal; Notable for the following components:      Result Value   aPTT 38 (*)    All other components within normal limits  COMPREHENSIVE  METABOLIC PANEL - Abnormal; Notable for the following components:   Sodium 132 (*)    CO2 20 (*)    Glucose, Bld 160 (*)    All other components within normal limits  URINALYSIS, ROUTINE W REFLEX MICROSCOPIC - Abnormal; Notable for the following components:   Color, Urine STRAW (*)    Specific Gravity, Urine 1.034 (*)    Hgb urine dipstick SMALL (*)    Leukocytes,Ua MODERATE (*)    All other components within normal limits  I-STAT CHEM 8, ED - Abnormal; Notable for the following components:   Sodium 134 (*)    Glucose, Bld 158 (*)    Calcium, Ion 1.06 (*)    All other components within normal limits  CBG MONITORING, ED - Abnormal; Notable for the following components:   Glucose-Capillary 155 (*)    All other components within normal limits  SARS CORONAVIRUS 2 BY RT PCR (HOSPITAL ORDER, [facility])  ETHANOL  PROTIME-INR  CBC  DIFFERENTIAL  RAPID URINE DRUG SCREEN, HOSP PERFORMED  MAGNESIUM  HEMOGLOBIN A1C  LIPID PANEL    EKG EKG Interpretation  Date/Time:  Friday October 14 2019 18:41:21 EDT Ventricular Rate:  87 PR Interval:    QRS Duration: 64 QT Interval:  443 QTC Calculation: 533 R Axis:   33 Text Interpretation: Sinus rhythm Borderline prolonged PR interval Low voltage, precordial leads Borderline T abnormalities, anterior leads Prolonged QT interval Baseline wander in lead(s) V3 V4 V5 Confirmed by Fredia Sorrow [phone]) on 10/14/2019 6:58:46 PM   Radiology CT CEREBRAL PERFUSION W CONTRAST  Result Date: 10/14/2019 CLINICAL DATA:  Right facial droop and slurred speech. EXAM: CT ANGIOGRAPHY HEAD AND NECK CT PERFUSION BRAIN TECHNIQUE: Multidetector CT imaging of the head and neck was performed using the  standard protocol during bolus administration of intravenous contrast. Multiplanar CT image reconstructions and MIPs were obtained to evaluate the vascular anatomy. Carotid stenosis measurements (when applicable) are obtained utilizing NASCET  criteria, using the distal internal carotid diameter as the denominator. Multiphase CT imaging of the brain was performed following IV bolus contrast injection. Subsequent parametric perfusion maps were calculated using RAPID software. CONTRAST:  158mL OMNIPAQUE IOHEXOL 350 MG/ML SOLN COMPARISON:  None. FINDINGS: CTA NECK FINDINGS Aortic arch: Normal variant aortic arch branching pattern with common origin of the brachiocephalic and left common carotid arteries. Mild aortic arch atherosclerosis without significant arch vessel origin stenosis. Right carotid system: Patent with minimal nonstenotic calcified plaque at the carotid bifurcation. Mildly ectatic appearance of the distal cervical ICA. Left carotid system: Patent with mild nonstenotic predominantly calcified plaque in the proximal ICA. Mildly ectatic appearance of the distal cervical ICA. Vertebral arteries: Patent without evidence of stenosis or dissection. Moderately dominant left vertebral artery. Skeleton: Moderately advanced cervical facet arthrosis with grade 1 anterolisthesis of C6 on C7 and C5 on C6. Left facet ankylosis at C5-6. Mild cervical disc degeneration. Other neck: No evidence of cervical lymphadenopathy or mass. Upper chest: Minimal dependent atelectasis in the superior segments of the lower lobes. Review of the MIP images confirms the above findings CTA HEAD FINDINGS Anterior circulation: The internal carotid arteries are patent from skull base to carotid termini with calcified plaque resulting in up to mild supraclinoid stenosis bilaterally. ACAs and MCAs are patent without evidence of a proximal branch occlusion or significant proximal stenosis. No aneurysm is identified. Posterior circulation: The intracranial vertebral arteries are patent to the basilar. Left V4 segment plaque results in luminal irregularity and up to mild stenosis. Patent PICA and SCA origins are identified bilaterally. The basilar artery is patent and mildly irregular  without significant stenosis. Posterior communicating arteries are diminutive or absent. The PCAs are patent with severe stenoses noted at the left P1-P2 junction and in the proximal right P3 segment. No aneurysm is identified. Venous sinuses: Patent. Anatomic variants: None. Review of the MIP images confirms the above findings CT Brain Perfusion Findings: ASPECTS: 10 CBF (<30%) Volume: 0 mL Perfusion (Tmax>6.0s) volume: 55mL, located along the floor of the left anterior cranial fossa and considered artifactual Mismatch Volume: n/a Infarction Location: None evident by CTP IMPRESSION: 1. No emergent large vessel occlusion. 2. Intracranial atherosclerosis with severe bilateral PCA stenoses. No significant proximal anterior circulation stenosis. 3. Widely patent cervical carotid and vertebral arteries. 4. Negative CTP. 5. Aortic Atherosclerosis (ICD10-I70.0). Electronically Signed   By: Logan Bores M.D.   On: 10/14/2019 19:37   CT HEAD CODE STROKE WO CONTRAST  Result Date: 10/14/2019 CLINICAL DATA:  Code stroke.  Right facial droop and slurred speech. EXAM: CT HEAD WITHOUT CONTRAST TECHNIQUE: Contiguous axial images were obtained from the base of the skull through the vertex without intravenous contrast. COMPARISON:  04/25/2018 FINDINGS: Brain: There is no evidence of an acute infarct, intracranial hemorrhage, mass, midline shift, or extra-axial fluid collection. Mild cerebral atrophy is unchanged. Hypodensities in the cerebral white matter bilaterally are also unchanged and nonspecific but compatible with mild chronic small vessel ischemic disease. Vascular: Calcified atherosclerosis at the skull base. No hyperdense vessel. Skull: No fracture or suspicious osseous lesion. Sinuses/Orbits: Clear paranasal sinuses and right mastoid air cells. Unchanged large left mastoid effusion. Bilateral cataract extraction. Other: None. ASPECTS [facility] Stroke Program Early CT Score) - Ganglionic level infarction (caudate,  lentiform nuclei, internal capsule, insula, M1-M3 cortex): 7 -  Supraganglionic infarction (M4-M6 cortex): 3 Total score (0-10 with 10 being normal): 10 IMPRESSION: 1. No evidence of acute intracranial abnormality. 2. ASPECTS is 10. 3. Mild chronic small vessel ischemic disease. 4. Unchanged large left mastoid effusion. These results were called by telephone at the time of interpretation on 10/14/2019 at 6:36 pm to Dr. Fredia Sorrow, who verbally acknowledged these results. Electronically Signed   By: Logan Bores M.D.   On: 10/14/2019 18:37   CT ANGIO HEAD CODE STROKE  Result Date: 10/14/2019 CLINICAL DATA:  Right facial droop and slurred speech. EXAM: CT ANGIOGRAPHY HEAD AND NECK CT PERFUSION BRAIN TECHNIQUE: Multidetector CT imaging of the head and neck was performed using the standard protocol during bolus administration of intravenous contrast. Multiplanar CT image reconstructions and MIPs were obtained to evaluate the vascular anatomy. Carotid stenosis measurements (when applicable) are obtained utilizing NASCET criteria, using the distal internal carotid diameter as the denominator. Multiphase CT imaging of the brain was performed following IV bolus contrast injection. Subsequent parametric perfusion maps were calculated using RAPID software. CONTRAST:  178mL OMNIPAQUE IOHEXOL 350 MG/ML SOLN COMPARISON:  None. FINDINGS: CTA NECK FINDINGS Aortic arch: Normal variant aortic arch branching pattern with common origin of the brachiocephalic and left common carotid arteries. Mild aortic arch atherosclerosis without significant arch vessel origin stenosis. Right carotid system: Patent with minimal nonstenotic calcified plaque at the carotid bifurcation. Mildly ectatic appearance of the distal cervical ICA. Left carotid system: Patent with mild nonstenotic predominantly calcified plaque in the proximal ICA. Mildly ectatic appearance of the distal cervical ICA. Vertebral arteries: Patent without evidence of stenosis  or dissection. Moderately dominant left vertebral artery. Skeleton: Moderately advanced cervical facet arthrosis with grade 1 anterolisthesis of C6 on C7 and C5 on C6. Left facet ankylosis at C5-6. Mild cervical disc degeneration. Other neck: No evidence of cervical lymphadenopathy or mass. Upper chest: Minimal dependent atelectasis in the superior segments of the lower lobes. Review of the MIP images confirms the above findings CTA HEAD FINDINGS Anterior circulation: The internal carotid arteries are patent from skull base to carotid termini with calcified plaque resulting in up to mild supraclinoid stenosis bilaterally. ACAs and MCAs are patent without evidence of a proximal branch occlusion or significant proximal stenosis. No aneurysm is identified. Posterior circulation: The intracranial vertebral arteries are patent to the basilar. Left V4 segment plaque results in luminal irregularity and up to mild stenosis. Patent PICA and SCA origins are identified bilaterally. The basilar artery is patent and mildly irregular without significant stenosis. Posterior communicating arteries are diminutive or absent. The PCAs are patent with severe stenoses noted at the left P1-P2 junction and in the proximal right P3 segment. No aneurysm is identified. Venous sinuses: Patent. Anatomic variants: None. Review of the MIP images confirms the above findings CT Brain Perfusion Findings: ASPECTS: 10 CBF (<30%) Volume: 0 mL Perfusion (Tmax>6.0s) volume: 73mL, located along the floor of the left anterior cranial fossa and considered artifactual Mismatch Volume: n/a Infarction Location: None evident by CTP IMPRESSION: 1. No emergent large vessel occlusion. 2. Intracranial atherosclerosis with severe bilateral PCA stenoses. No significant proximal anterior circulation stenosis. 3. Widely patent cervical carotid and vertebral arteries. 4. Negative CTP. 5. Aortic Atherosclerosis (ICD10-I70.0). Electronically Signed   By: Logan Bores M.D.    On: 10/14/2019 19:37   CT ANGIO NECK CODE STROKE  Result Date: 10/14/2019 CLINICAL DATA:  Right facial droop and slurred speech. EXAM: CT ANGIOGRAPHY HEAD AND NECK CT PERFUSION BRAIN TECHNIQUE: Multidetector CT imaging of  the head and neck was performed using the standard protocol during bolus administration of intravenous contrast. Multiplanar CT image reconstructions and MIPs were obtained to evaluate the vascular anatomy. Carotid stenosis measurements (when applicable) are obtained utilizing NASCET criteria, using the distal internal carotid diameter as the denominator. Multiphase CT imaging of the brain was performed following IV bolus contrast injection. Subsequent parametric perfusion maps were calculated using RAPID software. CONTRAST:  128mL OMNIPAQUE IOHEXOL 350 MG/ML SOLN COMPARISON:  None. FINDINGS: CTA NECK FINDINGS Aortic arch: Normal variant aortic arch branching pattern with common origin of the brachiocephalic and left common carotid arteries. Mild aortic arch atherosclerosis without significant arch vessel origin stenosis. Right carotid system: Patent with minimal nonstenotic calcified plaque at the carotid bifurcation. Mildly ectatic appearance of the distal cervical ICA. Left carotid system: Patent with mild nonstenotic predominantly calcified plaque in the proximal ICA. Mildly ectatic appearance of the distal cervical ICA. Vertebral arteries: Patent without evidence of stenosis or dissection. Moderately dominant left vertebral artery. Skeleton: Moderately advanced cervical facet arthrosis with grade 1 anterolisthesis of C6 on C7 and C5 on C6. Left facet ankylosis at C5-6. Mild cervical disc degeneration. Other neck: No evidence of cervical lymphadenopathy or mass. Upper chest: Minimal dependent atelectasis in the superior segments of the lower lobes. Review of the MIP images confirms the above findings CTA HEAD FINDINGS Anterior circulation: The internal carotid arteries are patent from  skull base to carotid termini with calcified plaque resulting in up to mild supraclinoid stenosis bilaterally. ACAs and MCAs are patent without evidence of a proximal branch occlusion or significant proximal stenosis. No aneurysm is identified. Posterior circulation: The intracranial vertebral arteries are patent to the basilar. Left V4 segment plaque results in luminal irregularity and up to mild stenosis. Patent PICA and SCA origins are identified bilaterally. The basilar artery is patent and mildly irregular without significant stenosis. Posterior communicating arteries are diminutive or absent. The PCAs are patent with severe stenoses noted at the left P1-P2 junction and in the proximal right P3 segment. No aneurysm is identified. Venous sinuses: Patent. Anatomic variants: None. Review of the MIP images confirms the above findings CT Brain Perfusion Findings: ASPECTS: 10 CBF (<30%) Volume: 0 mL Perfusion (Tmax>6.0s) volume: 56mL, located along the floor of the left anterior cranial fossa and considered artifactual Mismatch Volume: n/a Infarction Location: None evident by CTP IMPRESSION: 1. No emergent large vessel occlusion. 2. Intracranial atherosclerosis with severe bilateral PCA stenoses. No significant proximal anterior circulation stenosis. 3. Widely patent cervical carotid and vertebral arteries. 4. Negative CTP. 5. Aortic Atherosclerosis (ICD10-I70.0). Electronically Signed   By: Logan Bores M.D.   On: 10/14/2019 19:37    Procedures Procedures (including critical care time)  CRITICAL CARE Performed by: Fredia Sorrow Total critical care time: 60 minutes Critical care time was exclusive of separately billable procedures and treating other patients. Critical care was necessary to treat or prevent imminent or life-threatening deterioration. Critical care was time spent personally by me on the following activities: development of treatment plan with patient and/or surrogate as well as nursing,  discussions with consultants, evaluation of patient's response to treatment, examination of patient, obtaining history from patient or surrogate, ordering and performing treatments and interventions, ordering and review of laboratory studies, ordering and review of radiographic studies, pulse oximetry and re-evaluation of patient's condition.   Medications Ordered in ED Medications  latanoprost (XALATAN) 0.005 % ophthalmic solution 1 drop (1 drop Both Eyes Given 10/14/19 2344)  polyvinyl alcohol (LIQUIFILM TEARS) 1.4 % ophthalmic  solution 1 drop (has no administration in time range)   stroke: mapping our early stages of recovery book (has no administration in time range)  0.9 %  sodium chloride infusion ( Intravenous New Bag/Given 10/14/19 2238)  acetaminophen (TYLENOL) tablet 650 mg (has no administration in time range)    Or  acetaminophen (TYLENOL) 160 MG/5ML solution 650 mg (has no administration in time range)    Or  acetaminophen (TYLENOL) suppository 650 mg (has no administration in time range)  heparin injection 5,000 Units (5,000 Units Subcutaneous Given 10/14/19 2234)  aspirin suppository 300 mg (has no administration in time range)    Or  aspirin tablet 325 mg (has no administration in time range)  levothyroxine (SYNTHROID, LEVOTHROID) injection 50 mcg (has no administration in time range)  timolol (TIMOPTIC) 0.5 % ophthalmic solution 1 drop (1 drop Both Eyes Given 10/14/19 2344)  dorzolamide (TRUSOPT) 2 % ophthalmic solution 1 drop (1 drop Both Eyes Given 10/14/19 2344)  potassium chloride 10 mEq in 100 mL IVPB (10 mEq Intravenous New Bag/Given 10/14/19 2344)  ondansetron (ZOFRAN) 4 MG/2ML injection (  Given 10/14/19 1823)  iohexol (OMNIPAQUE) 350 MG/ML injection 100 mL (100 mLs Intravenous Contrast Given 10/14/19 1859)  aspirin suppository 300 mg (300 mg Rectal Given 10/14/19 2233)    ED Course  I have reviewed the triage vital signs and the nursing notes.  Pertinent labs & imaging results  that were available during my care of the patient were reviewed by me and considered in my medical decision making (see chart for details).    MDM Rules/Calculators/A&P                         Activated code stroke upon arrival.  Patient went immediately to head CT.  Head CT had no acute findings.  And patient seen by teleneurologist.  Teleneurologist as well as [place] tried to contact any family member to see when last normal was.  We were unable to get any information.  It was a little unclear from EMS whether the patient had the symptoms when the daughter got there or whether they developed after the daughter got there.  If they had developed afterwards patient would be a candidate for TPA.  But the teleneurologist did not feel comfortable because we did not have a clear last known normal other than yesterday.  He did recommend CT angio head neck which was done to make sure that there was no large vessel problem.  That was negative.  Contacted neuro hospitalist at [facility] recommended hospitalist admission at [facility].  It was Dr. Leonel Ramsay that I spoke with.  The hospitalist here in sitting was able to get a hold of the granddaughter.  This would have been at approximately 6 hours of onset and apparently patient did not initially have those symptoms but developed them shortly after the grand daughters arrival.   He was going to contact neurology again for clarification on what to do.  Most likely patient was out of the window for TPA at this point in time.  Patient will be admitted to [facility] for the stroke.  Code stroke order set was done initially.  The testing was negative  Final Clinical Impression(s) / ED Diagnoses Final diagnoses:  Cerebrovascular accident (CVA), unspecified mechanism [facility])    Rx / [place] Orders ED Discharge Orders    None       Fredia Sorrow, MD 10/15/19 [phone]

## 2019-10-14 NOTE — Progress Notes (Signed)
1804 call time 1810 beeper time 1819 exam started 1820 exam finished 1820 images sent to [place] exam completed in Epic 1823 [facility] radiology called

## 2019-10-14 NOTE — H&P (Addendum)
TRH H&P   Patient Demographics:    Kathleen [name], is a 86 y.o. female  MRN: [id number]   DOB - 02-Jun-1931  Admit Date - 10/14/2019  Outpatient Primary MD for the patient is Aletha Halim., PA-C  Referring MD/NP/PA: Dr Rogene Houston  Patient coming from: Home  Chief Complaint  Patient presents with  . Code Stroke      HPI:    Kathleen [name]  is a 86 y.o. female, with past medical history of diabetes mellitus, hypertension, hypothyroidism, pancreatitis, ED as code stroke, patient apparently lives by herself, last time she was seen normal around 5 PM, where she was found by her granddaughter, initially granddaughter is unavailable, story was obtained from EMS, as well ED staff, until neurologist try to communicate with her granddaughter with no success, upon my evaluation around 9:50 PM, I was able to get in touch with the daughter to get further details, apparently patient was last seen normal around 5:00 PM, per granddaughter she complained of left knee pain, and she went to sit on the recliner, she went to check her again at 5:15 p.m., where she was noted to have speech trouble, and right-sided weakness for which she called EMS, given no clear history while in ED and no clear time of onset despite attempting to call the family, she was not a TPA candidate, by the time of my evaluation was already passed TPA window which was 4.5 hours. - in ED CT angiogram head and neck with no large vessel occlusion, intracranial atherosclerosis and severe bilateral PCA stenosis, CT head with no evidence of acute intracranial abnormalities, labs significant for glucose of 160, mild hyponatremia at 132, potassium 3.7, patient was ordered aspirin suppository, and.  Hospitalist consulted to admit.    Review of systems:    unable to obtain appropriate review of system given her dysarthria   With Past History  of the following :    Past Medical History:  Diagnosis Date  . Arthritis   . Diabetes mellitus without complication ([place])    "borderline"  . Diverticulosis of colon   . High cholesterol   . Hypertension   . Hypothyroidism   . Pancreatitis       Past Surgical History:  Procedure Laterality Date  . ABDOMINAL HYSTERECTOMY     partial  . APPENDECTOMY    . bladder tac    . CHOLECYSTECTOMY  1999   cholelithiasis  . COLONOSCOPY  01/2007   Dr Olevia Perches: normal  . COLONOSCOPY  12/2000   Dr Deatra Ina- diverticulosis  . ESOPHAGOGASTRODUODENOSCOPY     Dr Damaris Hippo cannot remember  . EUS N/A 03/24/2012   Dr. Paulita Fujita: suspected passed CBD stone as cause of acute pancreatitis, suspective chronic pancreatitis.   Marland Kitchen EXCISION OF BREAST BIOPSY    . HIP PINNING,CANNULATED Left 01/15/2015   Procedure: CANNULATED HIP PINNING/LEFT;  Surgeon: Renette Butters, MD;  Location: [place];  Service: Orthopedics;  Laterality: Left;  Marland Kitchen VEIN SURGERY        Social History:     Social History   Tobacco Use  . Smoking status: Never Smoker  . Smokeless tobacco: Never Used  Substance Use Topics  . Alcohol use: No    Alcohol/week: 0.0 standard drinks     Lives -at home by herself     Family History :     Family History  Problem Relation Age of Onset  . Diabetes Father   . Diabetes Mother   . Cancer Mother        ovarian  . Cancer Sister 78       breast  . Cancer Sister 73       bone     Home Medications:   Prior to Admission medications   Medication Sig Start Date End Date Taking? Authorizing Provider  amLODipine (NORVASC) 5 MG tablet Take 5 mg by mouth every morning.     [provider]  aspirin EC 81 MG tablet Take 81 mg by mouth at bedtime.     [provider]  calcium carbonate (CALTRATE 600) 1500 (600 Ca) MG TABS tablet Take 1 tablet (1,500 mg total) by mouth 2 (two) times daily with a meal. 04/27/18   Kathie Dike, MD  carboxymethylcellulose (REFRESH PLUS) 0.5 %  SOLN Place 1 drop into both eyes 3 (three) times daily as needed (for dry eyes).     [provider]  COSOPT PF 22.3-6.8 MG/ML SOLN Place 1 drop into both eyes 2 (two) times daily.  10/11/13   [provider]  docusate sodium (COLACE) 100 MG capsule Take 100 mg by mouth 2 (two) times daily. 04/28/18   [provider]  fluticasone (FLONASE) 50 MCG/ACT nasal spray Place 1 spray into both nostrils daily as needed for allergies.     [provider]  furosemide (LASIX) 20 MG tablet Take 20 mg by mouth daily. 05/07/18   [provider]  latanoprost (XALATAN) 0.005 % ophthalmic solution Place 1 drop into both eyes at bedtime. 04/27/18   Kathie Dike, MD  levocetirizine (XYZAL) 5 MG tablet Take 5 mg by mouth every evening.    [provider]  levothyroxine (SYNTHROID, LEVOTHROID) 100 MCG tablet Take 1 tablet (100 mcg total) by mouth daily. 04/27/18   Kathie Dike, MD  lisinopril (PRINIVIL,ZESTRIL) 2.5 MG tablet Take 2.5 mg by mouth daily.     [provider]  loperamide (IMODIUM) 2 MG capsule Take 2 mg by mouth daily as needed for diarrhea or loose stools.  04/27/18   [provider]  metFORMIN (GLUCOPHAGE-XR) 500 MG 24 hr tablet Take 500 mg by mouth daily with breakfast.     [provider]  Multiple Vitamin (MULTIVITAMIN WITH MINERALS) TABS tablet Take 1 tablet by mouth daily.    [provider]  Omega-3 Fatty Acids (FISH OIL) 1000 MG CAPS Take 1 capsule by mouth daily.  04/27/18   [provider]  potassium chloride SA (K-DUR,KLOR-CON) 20 MEQ tablet Take 20 mEq by mouth daily. 05/11/18   [provider]  ranitidine (ZANTAC) 150 MG tablet Take 150 mg by mouth 2 (two) times daily.    [provider]  ZENPEP 20000-63000 units CPEP TAKE ONE CAPSULE BY MOUTH THREE TIMES DAILY BEFORE MEALS Patient not taking: Reported on 05/18/2018 06/19/16   Carlis Stable, NP     Allergies:     Allergies  Allergen Reactions  . Alendronate     Unknown reaction  . Cephalexin Other (See Comments)    Unknown reaction Unknown reaction Unknown reaction  . Clindamycin/Lincomycin Hives  . Creon [Pancreatin] Hives  . Doxycycline Other (See Comments)    Unknown reaction Unknown reaction  . Florastor [Yeast] Hives  . Penicillins Other (See Comments)    Has patient had a PCN reaction causing immediate rash, facial/tongue/throat swelling, SOB or lightheadedness with hypotension: unknown Has patient had a PCN reaction causing severe rash involving mucus membranes or skin necrosis: unknown Has patient had a PCN reaction that required hospitalization unknown Has patient had a PCN reaction occurring within the last 10 years:unknown If all of the above answers are "NO", then may proceed with Cephalosporin use.  Marland Kitchen Zenpep [Pancrelipase (Lip-Prot-Amyl)] Hives     Physical Exam:   Vitals  Blood pressure 121/75, pulse 91, resp. rate 18, SpO2 95 %.   1. General frail elderly female, laying in bed, no apparent distress  2.  She is awake, but with significant dysarthria , follows simple commands.  Nodding head questions yes or no  3.  Significant right-sided weakness, and facial droop, right upper extremity weaker than right lower extremity .  4. Ears and Eyes appear Normal, Conjunctivae clear, PERRLA. Moist Oral Mucosa.  5. Supple Neck, No JVD, No cervical lymphadenopathy appriciated, No Carotid Bruits.  6. Symmetrical Chest wall movement, Good air movement bilaterally, CTAB.  7. RRR, No Gallops, Rubs or Murmurs, No Parasternal Heave.  8. Positive Bowel Sounds, Abdomen Soft, No tenderness, No organomegaly appriciated,No rebound -guarding or rigidity.  9.  No Cyanosis, Normal Skin Turgor, No Skin Rash or Bruise.  10. Good muscle tone,  joints appear normal , no effusions, Normal ROM.  11. No Palpable Lymph Nodes in Neck or Axillae    Data Review:    CBC Recent Labs  Lab 10/14/19 1823  10/14/19 1827  WBC 8.6  --   HGB 13.8 13.9  HCT 40.5 41.0  PLT 252  --   MCV 90.8  --   MCH 30.9  --   MCHC 34.1  --   RDW 14.2  --   LYMPHSABS 1.5  --   MONOABS 0.8  --   EOSABS 0.0  --   BASOSABS 0.0  --    ------------------------------------------------------------------------------------------------------------------  Chemistries  Recent Labs  Lab 10/14/19 1827 10/14/19 1853  NA 134* 132*  K 4.6 3.7  CL 101 100  CO2  --  20*  GLUCOSE 158* 160*  BUN 15 14  CREATININE 0.60 0.59  CALCIUM  --  9.1  AST  --  24  ALT  --  16  ALKPHOS  --  74  BILITOT  --  0.6   ------------------------------------------------------------------------------------------------------------------ CrCl cannot be calculated (Unknown ideal weight.). ------------------------------------------------------------------------------------------------------------------ No results for input(s): TSH, T4TOTAL, T3FREE, THYROIDAB in the last 72 hours.  Invalid input(s): FREET3  Coagulation profile Recent Labs  Lab 10/14/19 1823  INR 1.0   ------------------------------------------------------------------------------------------------------------------- No results for input(s): DDIMER in the last 72 hours. -------------------------------------------------------------------------------------------------------------------  Cardiac Enzymes No results for input(s): CKMB, TROPONINI, MYOGLOBIN in the last 168 hours.  Invalid input(s): CK ------------------------------------------------------------------------------------------------------------------ No results found for: BNP   ---------------------------------------------------------------------------------------------------------------  Urinalysis    Component Value Date/Time   COLORURINE YELLOW 07/03/2018 [place] 07/03/2018 1447   LABSPEC 1.013 07/03/2018 1447   PHURINE 7.0 07/03/2018 1447   GLUCOSEU NEGATIVE 07/03/2018  1447   HGBUR SMALL (A) 07/03/2018 1447  BILIRUBINUR NEGATIVE 07/03/2018 [place] 07/03/2018 1447   PROTEINUR NEGATIVE 07/03/2018 1447   UROBILINOGEN 0.2 03/29/2013 1332   NITRITE NEGATIVE 07/03/2018 1447   LEUKOCYTESUR NEGATIVE 07/03/2018 1447    ----------------------------------------------------------------------------------------------------------------   Imaging Results:    CT CEREBRAL PERFUSION W CONTRAST  Result Date: 10/14/2019 CLINICAL DATA:  Right facial droop and slurred speech. EXAM: CT ANGIOGRAPHY HEAD AND NECK CT PERFUSION BRAIN TECHNIQUE: Multidetector CT imaging of the head and neck was performed using the standard protocol during bolus administration of intravenous contrast. Multiplanar CT image reconstructions and MIPs were obtained to evaluate the vascular anatomy. Carotid stenosis measurements (when applicable) are obtained utilizing NASCET criteria, using the distal internal carotid diameter as the denominator. Multiphase CT imaging of the brain was performed following IV bolus contrast injection. Subsequent parametric perfusion maps were calculated using RAPID software. CONTRAST:  132mL OMNIPAQUE IOHEXOL 350 MG/ML SOLN COMPARISON:  None. FINDINGS: CTA NECK FINDINGS Aortic arch: Normal variant aortic arch branching pattern with common origin of the brachiocephalic and left common carotid arteries. Mild aortic arch atherosclerosis without significant arch vessel origin stenosis. Right carotid system: Patent with minimal nonstenotic calcified plaque at the carotid bifurcation. Mildly ectatic appearance of the distal cervical ICA. Left carotid system: Patent with mild nonstenotic predominantly calcified plaque in the proximal ICA. Mildly ectatic appearance of the distal cervical ICA. Vertebral arteries: Patent without evidence of stenosis or dissection. Moderately dominant left vertebral artery. Skeleton: Moderately advanced cervical facet arthrosis with grade 1  anterolisthesis of C6 on C7 and C5 on C6. Left facet ankylosis at C5-6. Mild cervical disc degeneration. Other neck: No evidence of cervical lymphadenopathy or mass. Upper chest: Minimal dependent atelectasis in the superior segments of the lower lobes. Review of the MIP images confirms the above findings CTA HEAD FINDINGS Anterior circulation: The internal carotid arteries are patent from skull base to carotid termini with calcified plaque resulting in up to mild supraclinoid stenosis bilaterally. ACAs and MCAs are patent without evidence of a proximal branch occlusion or significant proximal stenosis. No aneurysm is identified. Posterior circulation: The intracranial vertebral arteries are patent to the basilar. Left V4 segment plaque results in luminal irregularity and up to mild stenosis. Patent PICA and SCA origins are identified bilaterally. The basilar artery is patent and mildly irregular without significant stenosis. Posterior communicating arteries are diminutive or absent. The PCAs are patent with severe stenoses noted at the left P1-P2 junction and in the proximal right P3 segment. No aneurysm is identified. Venous sinuses: Patent. Anatomic variants: None. Review of the MIP images confirms the above findings CT Brain Perfusion Findings: ASPECTS: 10 CBF (<30%) Volume: 0 mL Perfusion (Tmax>6.0s) volume: 65mL, located along the floor of the left anterior cranial fossa and considered artifactual Mismatch Volume: n/a Infarction Location: None evident by CTP IMPRESSION: 1. No emergent large vessel occlusion. 2. Intracranial atherosclerosis with severe bilateral PCA stenoses. No significant proximal anterior circulation stenosis. 3. Widely patent cervical carotid and vertebral arteries. 4. Negative CTP. 5. Aortic Atherosclerosis (ICD10-I70.0). Electronically Signed   By: Logan Bores M.D.   On: 10/14/2019 19:37   CT HEAD CODE STROKE WO CONTRAST  Result Date: 10/14/2019 CLINICAL DATA:  Code stroke.  Right  facial droop and slurred speech. EXAM: CT HEAD WITHOUT CONTRAST TECHNIQUE: Contiguous axial images were obtained from the base of the skull through the vertex without intravenous contrast. COMPARISON:  04/25/2018 FINDINGS: Brain: There is no evidence of an acute infarct, intracranial hemorrhage, mass, midline shift, or extra-axial fluid  collection. Mild cerebral atrophy is unchanged. Hypodensities in the cerebral white matter bilaterally are also unchanged and nonspecific but compatible with mild chronic small vessel ischemic disease. Vascular: Calcified atherosclerosis at the skull base. No hyperdense vessel. Skull: No fracture or suspicious osseous lesion. Sinuses/Orbits: Clear paranasal sinuses and right mastoid air cells. Unchanged large left mastoid effusion. Bilateral cataract extraction. Other: None. ASPECTS [facility] Stroke Program Early CT Score) - Ganglionic level infarction (caudate, lentiform nuclei, internal capsule, insula, M1-M3 cortex): 7 - Supraganglionic infarction (M4-M6 cortex): 3 Total score (0-10 with 10 being normal): 10 IMPRESSION: 1. No evidence of acute intracranial abnormality. 2. ASPECTS is 10. 3. Mild chronic small vessel ischemic disease. 4. Unchanged large left mastoid effusion. These results were called by telephone at the time of interpretation on 10/14/2019 at 6:36 pm to Dr. Fredia Sorrow, who verbally acknowledged these results. Electronically Signed   By: Logan Bores M.D.   On: 10/14/2019 18:37   CT ANGIO HEAD CODE STROKE  Result Date: 10/14/2019 CLINICAL DATA:  Right facial droop and slurred speech. EXAM: CT ANGIOGRAPHY HEAD AND NECK CT PERFUSION BRAIN TECHNIQUE: Multidetector CT imaging of the head and neck was performed using the standard protocol during bolus administration of intravenous contrast. Multiplanar CT image reconstructions and MIPs were obtained to evaluate the vascular anatomy. Carotid stenosis measurements (when applicable) are obtained utilizing NASCET  criteria, using the distal internal carotid diameter as the denominator. Multiphase CT imaging of the brain was performed following IV bolus contrast injection. Subsequent parametric perfusion maps were calculated using RAPID software. CONTRAST:  165mL OMNIPAQUE IOHEXOL 350 MG/ML SOLN COMPARISON:  None. FINDINGS: CTA NECK FINDINGS Aortic arch: Normal variant aortic arch branching pattern with common origin of the brachiocephalic and left common carotid arteries. Mild aortic arch atherosclerosis without significant arch vessel origin stenosis. Right carotid system: Patent with minimal nonstenotic calcified plaque at the carotid bifurcation. Mildly ectatic appearance of the distal cervical ICA. Left carotid system: Patent with mild nonstenotic predominantly calcified plaque in the proximal ICA. Mildly ectatic appearance of the distal cervical ICA. Vertebral arteries: Patent without evidence of stenosis or dissection. Moderately dominant left vertebral artery. Skeleton: Moderately advanced cervical facet arthrosis with grade 1 anterolisthesis of C6 on C7 and C5 on C6. Left facet ankylosis at C5-6. Mild cervical disc degeneration. Other neck: No evidence of cervical lymphadenopathy or mass. Upper chest: Minimal dependent atelectasis in the superior segments of the lower lobes. Review of the MIP images confirms the above findings CTA HEAD FINDINGS Anterior circulation: The internal carotid arteries are patent from skull base to carotid termini with calcified plaque resulting in up to mild supraclinoid stenosis bilaterally. ACAs and MCAs are patent without evidence of a proximal branch occlusion or significant proximal stenosis. No aneurysm is identified. Posterior circulation: The intracranial vertebral arteries are patent to the basilar. Left V4 segment plaque results in luminal irregularity and up to mild stenosis. Patent PICA and SCA origins are identified bilaterally. The basilar artery is patent and mildly irregular  without significant stenosis. Posterior communicating arteries are diminutive or absent. The PCAs are patent with severe stenoses noted at the left P1-P2 junction and in the proximal right P3 segment. No aneurysm is identified. Venous sinuses: Patent. Anatomic variants: None. Review of the MIP images confirms the above findings CT Brain Perfusion Findings: ASPECTS: 10 CBF (<30%) Volume: 0 mL Perfusion (Tmax>6.0s) volume: 46mL, located along the floor of the left anterior cranial fossa and considered artifactual Mismatch Volume: n/a Infarction Location: None evident by CTP  IMPRESSION: 1. No emergent large vessel occlusion. 2. Intracranial atherosclerosis with severe bilateral PCA stenoses. No significant proximal anterior circulation stenosis. 3. Widely patent cervical carotid and vertebral arteries. 4. Negative CTP. 5. Aortic Atherosclerosis (ICD10-I70.0). Electronically Signed   By: Logan Bores M.D.   On: 10/14/2019 19:37   CT ANGIO NECK CODE STROKE  Result Date: 10/14/2019 CLINICAL DATA:  Right facial droop and slurred speech. EXAM: CT ANGIOGRAPHY HEAD AND NECK CT PERFUSION BRAIN TECHNIQUE: Multidetector CT imaging of the head and neck was performed using the standard protocol during bolus administration of intravenous contrast. Multiplanar CT image reconstructions and MIPs were obtained to evaluate the vascular anatomy. Carotid stenosis measurements (when applicable) are obtained utilizing NASCET criteria, using the distal internal carotid diameter as the denominator. Multiphase CT imaging of the brain was performed following IV bolus contrast injection. Subsequent parametric perfusion maps were calculated using RAPID software. CONTRAST:  123mL OMNIPAQUE IOHEXOL 350 MG/ML SOLN COMPARISON:  None. FINDINGS: CTA NECK FINDINGS Aortic arch: Normal variant aortic arch branching pattern with common origin of the brachiocephalic and left common carotid arteries. Mild aortic arch atherosclerosis without significant  arch vessel origin stenosis. Right carotid system: Patent with minimal nonstenotic calcified plaque at the carotid bifurcation. Mildly ectatic appearance of the distal cervical ICA. Left carotid system: Patent with mild nonstenotic predominantly calcified plaque in the proximal ICA. Mildly ectatic appearance of the distal cervical ICA. Vertebral arteries: Patent without evidence of stenosis or dissection. Moderately dominant left vertebral artery. Skeleton: Moderately advanced cervical facet arthrosis with grade 1 anterolisthesis of C6 on C7 and C5 on C6. Left facet ankylosis at C5-6. Mild cervical disc degeneration. Other neck: No evidence of cervical lymphadenopathy or mass. Upper chest: Minimal dependent atelectasis in the superior segments of the lower lobes. Review of the MIP images confirms the above findings CTA HEAD FINDINGS Anterior circulation: The internal carotid arteries are patent from skull base to carotid termini with calcified plaque resulting in up to mild supraclinoid stenosis bilaterally. ACAs and MCAs are patent without evidence of a proximal branch occlusion or significant proximal stenosis. No aneurysm is identified. Posterior circulation: The intracranial vertebral arteries are patent to the basilar. Left V4 segment plaque results in luminal irregularity and up to mild stenosis. Patent PICA and SCA origins are identified bilaterally. The basilar artery is patent and mildly irregular without significant stenosis. Posterior communicating arteries are diminutive or absent. The PCAs are patent with severe stenoses noted at the left P1-P2 junction and in the proximal right P3 segment. No aneurysm is identified. Venous sinuses: Patent. Anatomic variants: None. Review of the MIP images confirms the above findings CT Brain Perfusion Findings: ASPECTS: 10 CBF (<30%) Volume: 0 mL Perfusion (Tmax>6.0s) volume: 42mL, located along the floor of the left anterior cranial fossa and considered artifactual  Mismatch Volume: n/a Infarction Location: None evident by CTP IMPRESSION: 1. No emergent large vessel occlusion. 2. Intracranial atherosclerosis with severe bilateral PCA stenoses. No significant proximal anterior circulation stenosis. 3. Widely patent cervical carotid and vertebral arteries. 4. Negative CTP. 5. Aortic Atherosclerosis (ICD10-I70.0). Electronically Signed   By: Logan Bores M.D.   On: 10/14/2019 19:37    My personal review of EKG: Rhythm NSR, Rate  87 /min, QTc533   Assessment & Plan:    Active Problems:   Hypothyroidism   Chronic pancreatitis ([place])   Controlled type 2 diabetes mellitus without complication, without long-term current use of insulin ([facility])   GERD without esophagitis   Hyperlipidemia associated with type 2  diabetes mellitus ([place])   CVA (cerebrovascular accident due to intracerebral hemorrhage) ([place])   Acute CVA -Patient clinical findings suspicious for acute CVA, she is admitted under CVA pathway, CTA head and neck already obtained, CT head with no acute findings, she will need to have MRI brain, 2D echo, monitor on telemetry, check A1c, check lipid panel, allow for permissive hypertension, will keep on aspirin suppository 300 mg until she passes her swallow evaluation, will await further recommendation from neurology. -Patient to be transferred to [facility] as discussed with neurology Dr. Leonel Ramsay.  Hypothyroidism -Continue Synthroid, changed to IV till she is able to pass her swallow evaluation.  Hyperlipidemia -Check lipid panel, she is n.p.o., cannot take statin.  GERD -Resume PPI when able to swallow  Prolonged QTC -QTC of 533, avoid prolonging agent, potassium 3.7, will correct, will check magnesium, repeat EKG in a.m., monitoring telemetry.  Diabetes mellitus -We will hold Metformin, she is n.p.o., for now we will keep on CBG monitoring, and if this is elevated then will start on insulin sliding scale.    Hypertension -Hold amlodipine and  lisinopril, allow for permissive hypertension   DVT Prophylaxis Heparin   AM Labs Ordered, also please review Full Orders  Family Communication: Admission, patients condition and plan of care including tests being ordered have been discussed with the granddaughter who indicate understanding and agree with the plan and Code Status.  Code Status DNR confirmed by granddaughter POA lisa  Likely DC to likely will need SNF  Condition GUARDED  Consults called: Teleneurology, and Dr Leonel Ramsay at [facility]  Admission status:  inpatient  Time spent in minutes : 51 minutes   Phillips Climes M.D on 10/14/2019 at 9:43 PM   [facility] - Office  [phone]

## 2019-10-14 NOTE — Consult Note (Addendum)
[facility] Consult Services   Date of Service:   10/14/2019 18:22:28  Impression:     .  I63.9 - Cerebrovascular accident (CVA), unspecified mechanism ([place])  Comments/Sign-Out: 86 year old female history of hypertension diabetes presents with right hemiparesis and dysarthria. Unknown last known well, apparently symptoms have been ongoing since at least 5 PM when she was apparently found by daughter at that time per EMS, however patient lives alone and patient says "I do not know" when asked when she last felt her normal self. We are also unable to get a hold of family at this point time to rule out contraindications. Therefore, no TPA. I did place a request for CT angiogram and CT perfusion as well, which is pending. Assuming no large vessel occlusion, would recommend admission for stroke work-up.  Update: CTA/P showed no LVO.  Metrics: Last Known Well: Unknown [facility] Notification Time: 10/14/2019 18:22:28 Arrival Time: 10/14/2019 18:14:00 Stamp Time: 10/14/2019 18:22:28 Time First Login Attempt: 10/14/2019 18:25:30 Symptoms: Dysarthria, right hemiparesis. NIHSS Start Assessment Time: 10/14/2019 18:28:13 Patient is not a candidate for Thrombolytic. Thrombolytic Medical Decision: 10/14/2019 18:41:24 Patient was not deemed candidate for Thrombolytic because of following reasons: Unknown last known well, patient lives alone, unable to get a hold of family to get more corroborating history on her last known well as well as contraindications to IV thrombolytics.  CT head showed no acute hemorrhage or acute core infarct.  ED Physician notified of diagnostic impression and management plan on 10/14/2019 18:45:00  Advanced Imaging: CTA Head and Neck ordered  CTP ordered   Our recommendations are outlined below.  Recommendations:     .  Activate Stroke Protocol Admission/Order Set     .  Stroke/Telemetry Floor     .  Neuro Checks     .   Bedside Swallow Eval     .  DVT Prophylaxis     .  IV Fluids, Normal Saline     .  Head of Bed 30 Degrees     .  Euglycemia and Avoid Hyperthermia (PRN Acetaminophen)     .  Initiate Aspirin 81 MG Daily     .  MRI brain without IV contrast     .  MRA head/neck     .  TSH, A1c, lipid profile     .  Transthoracic echocardiogram     .  Continuous telemetry     .  Physical, occupational, and speech therapies     .  q4h neuro checks/NIHSS     .  NPO until bedside swallow     .  Neurology follow-up  Routine Consultation with [facility] for Follow up Care  Sign Out:     .  Discussed with Emergency Department Provider    ------------------------------------------------------------------------------  History of Present Illness: Patient is a 86 year old Female.  Patient was brought by EMS for symptoms of Dysarthria, right hemiparesis.  This is an 86 year old female with a documented history of hypertension, diabetes, all who presents the emerge department with right hemiparesis and dysarthria. She apparently lives alone, and when asked when her symptoms started she says "I do not know". She is a poor historian. She is also very hard of hearing which makes the exam a bit difficult. Apparently, she was found at around 5 PM with the symptoms by daughter per EMS, however we do not know exactly when she was last her normal self as she does live alone. On exam, she does appear to  have a right facial droop along with right leg greater than arm weakness and mild ataxia. No sensory loss. She does not have a clear visual field deficit. She is very hard of hearing, but does not appear to be overtly aphasic. CT head without contrast showed no acute findings. [id number] dr Helane Gunther   Past Medical History:     . Hypertension     . Diabetes Mellitus   Antiplatelet use: asa    Examination: BP(131/76), Pulse(87), Blood Glucose(155) 1A: Level of Consciousness - Alert; keenly responsive +  0 1B: Ask Month and Age - Both Questions Right + 0 1C: Blink Eyes & Squeeze Hands - Performs Both Tasks + 0 2: Test Horizontal Extraocular Movements - Normal + 0 3: Test Visual Fields - No Visual Loss + 0 4: Test Facial Palsy (Use Grimace if Obtunded) - Normal symmetry + 0 5A: Test Left Arm Motor Drift - No Drift for 10 Seconds + 0 5B: Test Right Arm Motor Drift - Drift, but doesn't hit bed + 1 6A: Test Left Leg Motor Drift - No Drift for 5 Seconds + 0 6B: Test Right Leg Motor Drift - Some Effort Against Gravity + 2 7: Test Limb Ataxia (FNF/Heel-Shin) - Ataxia in 1 Limb + 1 8: Test Sensation - Normal; No sensory loss + 0 9: Test Language/Aphasia - Normal; No aphasia + 0 10: Test Dysarthria - Normal + 0 11: Test Extinction/Inattention - No abnormality + 0  NIHSS Score: 4  Pre-Morbid Modified Rankin Scale: Unable to assess     Patient is being evaluated for possible acute neurologic impairment and high probability of imminent or life-threatening deterioration. I spent total of 35 minutes providing care to this patient, including time for face to face visit via telemedicine, review of medical records, imaging studies and discussion of findings with providers, the patient and/or family.   Dr Knox Royalty   [facility] [phone]  Case [id number]

## 2019-10-14 NOTE — ED Triage Notes (Signed)
Pt brought to ED via [facility]. Code stroke called, slurred speech noted, right sided droop and right sided weakness. [id number]

## 2019-10-15 ENCOUNTER — Inpatient Hospital Stay (HOSPITAL_COMMUNITY): Payer: PPO

## 2019-10-15 DIAGNOSIS — K219 Gastro-esophageal reflux disease without esophagitis: Secondary | ICD-10-CM

## 2019-10-15 DIAGNOSIS — E785 Hyperlipidemia, unspecified: Secondary | ICD-10-CM

## 2019-10-15 DIAGNOSIS — E1169 Type 2 diabetes mellitus with other specified complication: Secondary | ICD-10-CM

## 2019-10-15 DIAGNOSIS — I10 Essential (primary) hypertension: Secondary | ICD-10-CM

## 2019-10-15 DIAGNOSIS — I6389 Other cerebral infarction: Secondary | ICD-10-CM

## 2019-10-15 DIAGNOSIS — K861 Other chronic pancreatitis: Secondary | ICD-10-CM

## 2019-10-15 LAB — CBG MONITORING, ED
Glucose-Capillary: 111 mg/dL — ABNORMAL HIGH (ref 70–99)
Glucose-Capillary: 116 mg/dL — ABNORMAL HIGH (ref 70–99)
Glucose-Capillary: 132 mg/dL — ABNORMAL HIGH (ref 70–99)
Glucose-Capillary: 21 mg/dL — CL (ref 70–99)
Glucose-Capillary: 298 mg/dL — ABNORMAL HIGH (ref 70–99)
Glucose-Capillary: 254 mg/dL — ABNORMAL HIGH (ref 70–99)
Glucose-Capillary: 110 mg/dL — ABNORMAL HIGH (ref 70–99)

## 2019-10-15 LAB — LIPID PANEL
Cholesterol: 203 mg/dL — ABNORMAL HIGH (ref 0–200)
HDL: 56 mg/dL
LDL Cholesterol: 134 mg/dL — ABNORMAL HIGH (ref 0–99)
Total CHOL/HDL Ratio: 3.6 ratio
Triglycerides: 66 mg/dL
VLDL: 13 mg/dL (ref 0–40)

## 2019-10-15 LAB — ECHOCARDIOGRAM COMPLETE
Area-P 1/2: 1.29 cm2
S' Lateral: 2.42 cm

## 2019-10-15 LAB — T4, FREE: Free T4: 1 ng/dL (ref 0.61–1.12)

## 2019-10-15 LAB — HEMOGLOBIN A1C
Hgb A1c MFr Bld: 5.8 % — ABNORMAL HIGH (ref 4.8–5.6)
Mean Plasma Glucose: 119.76 mg/dL

## 2019-10-15 LAB — TSH: TSH: 6.553 u[IU]/mL — ABNORMAL HIGH (ref 0.350–4.500)

## 2019-10-15 LAB — GLUCOSE, CAPILLARY: Glucose-Capillary: 100 mg/dL — ABNORMAL HIGH (ref 70–99)

## 2019-10-15 LAB — VITAMIN B12: Vitamin B-12: 744 pg/mL (ref 180–914)

## 2019-10-15 MED ORDER — PANTOPRAZOLE SODIUM 40 MG IV SOLR
40.0000 mg | INTRAVENOUS | Status: DC
  Administered 2019-10-15 – 2019-10-19 (×5): 40 mg via INTRAVENOUS
  Filled 2019-10-15 (×5): qty 40

## 2019-10-15 MED ORDER — DEXTROSE 50 % IV SOLN
INTRAVENOUS | Status: AC
  Administered 2019-10-15: 50 mL
  Filled 2019-10-15: qty 50

## 2019-10-15 NOTE — ED Notes (Signed)
Call to Granddaughter   Kathyrn Lass [phone]

## 2019-10-15 NOTE — Progress Notes (Signed)
Updated granddaughter Kathyrn Lass [phone] to arrival to [id number] gave main desk phone number and nurse phone number for tonight.  Asked some general admission questions.

## 2019-10-15 NOTE — Plan of Care (Signed)
Discussed in front of patient plan of care for the evening, department admission and NIH with no evidence of learning at this time.

## 2019-10-15 NOTE — Progress Notes (Signed)
SLP Cancellation Note  Patient Details Name: Kathleen [name] MRN: [id number] DOB: [birth year]   Cancelled treatment:       Reason Eval/Treat Not Completed: Fatigue/lethargy limiting ability to participate;Patient's level of consciousness. SLP attempted to complete SLE this am, however Pt was unable to be roused to an appropriate level to participate in SLE, Pt would attempt to say a word or respond (unintelligible) and then would fall back asleep despite max verbal cues. ST will continue efforts. Thank you,  Deamonte Sayegh H. Roddie Mc, [place] Speech Language Pathologist    Wende Bushy 10/15/2019, 9:24 AM

## 2019-10-15 NOTE — ED Notes (Signed)
Report to [place], [place]

## 2019-10-15 NOTE — Progress Notes (Signed)
*  PRELIMINARY RESULTS* Echocardiogram 2D Echocardiogram has been performed with Definity.  Samuel Germany 10/15/2019, 11:14 AM

## 2019-10-15 NOTE — Progress Notes (Signed)
PROGRESS NOTE    Kathleen [name]  BLT:[id number] DOB: 01-04-1932 DOA: 10/14/2019 PCP: Aletha Halim., PA-C    Chief Complaint  Patient presents with   Code Stroke    Brief Narrative:  As per H&P written by Dr. Waldron Labs on 10/14/19 Kathleen [name]  is a 86 y.o. female, with past medical history of diabetes mellitus, hypertension, GERD,  hypothyroidism and hx of chronic pancreatitis; who presented to the ED as code stroke, patient apparently lives by herself, patient was last seen normal around 5:00 PM, per granddaughter she complained of left knee pain, and she went to sit on the recliner, she went to check her again at 5:15 p.m., where she was noted to have speech trouble, and right-sided weakness for which she called EMS, given no clear history while in ED and no clear time of onset despite attempting to call the family, she was not a TPA candidate, by the time of admitting physician evaluation, patient already passed TPA window which was 4.5 hours. - in ED CT angiogram head and neck without large vessel occlusion, intracranial atherosclerosis and severe bilateral PCA stenosis, CT head with no evidence of acute intracranial abnormalities, labs significant for glucose of 160, mild hyponatremia at 132, potassium 3.7, patient was ordered aspirin suppository, and.  Hospitalist consulted to admit for further evaluation and management.  Assessment & Plan: 1-dysarthria/stroke like symptoms: -patient CT w/o acute intracranial abnormalities -complete work up, checking thyroid panel and B12 level -was on baby aspirin daily prior to admission  -receiving 300mg  rectally currently while waiting for her to passed swallowing evaluation -will follow MRI, 2-D echo and neurology service rec's.  2-Hypothyroidism -check Thyroid panel  -continue synthroid   3-Chronic pancreatitis ([place]) -will resume Zenpep when able to tolerate PO's after passing swallowing eval.  4-Controlled type 2 diabetes mellitus  without complication, without long-term current use of insulin ([place]) -will follow CBG's if > 200 will start SSI -check A1C -holding oral hypoglycemic agents while inpatient   5-GERD without esophagitis -continue PPI, IV currently  6-Hyperlipidemia associated with type 2 diabetes mellitus ([facility]) -lipid panel demonstrating LDL 134 -will resume adjusted dose of statins when able to tolerate PO's  7-HTN -follow vital signs while allowing permissive hypertension -continue holding amlodipine and privinil currently  8-hx of glaucoma -continue the use of xalatan and cosopt equivalent while inpatient.  9-mild hyponatremia: -with CBG's in the 160's currently -will provide gentle IVF's resuscitation -follow electrolytes trend   DVT prophylaxis: heparin  Code Status: DNR Family Communication: no family at bedside currently. Daughter was updated at night by admitting physician on plan of care (10/14/19) Disposition:   Status is: Inpatient  Dispo: The patient is from: Home               Anticipated d/c is to: to be determine              Anticipated d/c date is: 1-2 days              Patient currently is not medically stable for discharge; still with pending stroke work up and neurology evaluation. Has failed to passed swallowing evaluation and might need SNF for rehab after discharge.     Consultants:   Neurology service    Procedures:  See below for x-ray reports  2-D echo: pending   Antimicrobials:  None   Subjective: Afebrile, following simple commands; with significant expressive aphasia and dysarthria; positive right-sided hemiparesis.  Objective: Vitals:   10/15/19 0515 10/15/19 0530 10/15/19  0545 10/15/19 0600  BP: 115/60 107/75 121/61 115/60  Pulse: 74  74 75  Resp: 18 14 17 18   SpO2: 98% 97% 98% 99%    Intake/Output Summary (Last 24 hours) at 10/15/2019 0732 Last data filed at 10/15/2019 0042 Gross per 24 hour  Intake 200 ml  Output --  Net 200 ml   There  were no vitals filed for this visit.  Examination:  General exam: Appears calm and comfortable, afebrile, no chest pain, no nausea, no vomiting.  With positive expressive aphasia and right-sided hemiparesis. Respiratory system: Clear to auscultation. Respiratory effort normal. Cardiovascular system: S1 & S2 heard, RRR. No JVD, murmurs, rubs, gallops or clicks. No pedal edema. Gastrointestinal system: Abdomen is nondistended, soft and nontender. No organomegaly or masses felt. Normal bowel sounds heard. Central nervous system: Right-sided hemiparesis, positive dysarthria and expressive aphasia. Extremities: No cyanosis or clubbing.  No edema. Skin: No petechiae. Psychiatry: Mood & affect appropriate.     Data Reviewed: I have personally reviewed following labs and imaging studies  CBC: Recent Labs  Lab 10/14/19 1823 10/14/19 1827  WBC 8.6  --   NEUTROABS 6.3  --   HGB 13.8 13.9  HCT 40.5 41.0  MCV 90.8  --   PLT 252  --     Basic Metabolic Panel: Recent Labs  Lab 10/14/19 1827 10/14/19 1853  NA 134* 132*  K 4.6 3.7  CL 101 100  CO2  --  20*  GLUCOSE 158* 160*  BUN 15 14  CREATININE 0.60 0.59  CALCIUM  --  9.1  MG  --  1.9    GFR: CrCl cannot be calculated (Unknown ideal weight.).  Liver Function Tests: Recent Labs  Lab 10/14/19 1853  AST 24  ALT 16  ALKPHOS 74  BILITOT 0.6  PROT 7.1  ALBUMIN 4.1    CBG: Recent Labs  Lab 10/14/19 1824 10/15/19 0223  GLUCAP 155* 111*     Recent Results (from the past 240 hour(s))  SARS Coronavirus 2 by RT PCR (hospital order, performed in [facility] hospital lab) Nasopharyngeal Nasopharyngeal Swab     Status: None   Collection Time: 10/14/19  8:51 PM   Specimen: Nasopharyngeal Swab  Result Value Ref Range Status   SARS Coronavirus 2 NEGATIVE NEGATIVE Final    Comment: (NOTE) SARS-CoV-2 target nucleic acids are NOT DETECTED.  The SARS-CoV-2 RNA is generally detectable in upper and lower respiratory  specimens during the acute phase of infection. The lowest concentration of SARS-CoV-2 viral copies this assay can detect is 250 copies / mL. A negative result does not preclude SARS-CoV-2 infection and should not be used as the sole basis for treatment or other patient management decisions.  A negative result may occur with improper specimen collection / handling, submission of specimen other than nasopharyngeal swab, presence of viral mutation(s) within the areas targeted by this assay, and inadequate number of viral copies (<250 copies / mL). A negative result must be combined with clinical observations, patient history, and epidemiological information.  Fact Sheet for Patients:   StrictlyIdeas.no  Fact Sheet for Healthcare Providers: BankingDealers.co.za  This test is not yet approved or  cleared by the [place] FDA and has been authorized for detection and/or diagnosis of SARS-CoV-2 by FDA under an Emergency Use Authorization (EUA).  This EUA will remain in effect (meaning this test can be used) for the duration of the COVID-19 declaration under Section 564(b)(1) of the Act, 21 U.S.C. section 360bbb-3(b)(1), unless the authorization  is terminated or revoked sooner.  Performed at [facility], [address], [place], [place] [address]      Radiology Studies: CT CEREBRAL PERFUSION W CONTRAST  Result Date: 10/14/2019 CLINICAL DATA:  Right facial droop and slurred speech. EXAM: CT ANGIOGRAPHY HEAD AND NECK CT PERFUSION BRAIN TECHNIQUE: Multidetector CT imaging of the head and neck was performed using the standard protocol during bolus administration of intravenous contrast. Multiplanar CT image reconstructions and MIPs were obtained to evaluate the vascular anatomy. Carotid stenosis measurements (when applicable) are obtained utilizing NASCET criteria, using the distal internal carotid diameter as the denominator. Multiphase CT imaging  of the brain was performed following IV bolus contrast injection. Subsequent parametric perfusion maps were calculated using RAPID software. CONTRAST:  151mL OMNIPAQUE IOHEXOL 350 MG/ML SOLN COMPARISON:  None. FINDINGS: CTA NECK FINDINGS Aortic arch: Normal variant aortic arch branching pattern with common origin of the brachiocephalic and left common carotid arteries. Mild aortic arch atherosclerosis without significant arch vessel origin stenosis. Right carotid system: Patent with minimal nonstenotic calcified plaque at the carotid bifurcation. Mildly ectatic appearance of the distal cervical ICA. Left carotid system: Patent with mild nonstenotic predominantly calcified plaque in the proximal ICA. Mildly ectatic appearance of the distal cervical ICA. Vertebral arteries: Patent without evidence of stenosis or dissection. Moderately dominant left vertebral artery. Skeleton: Moderately advanced cervical facet arthrosis with grade 1 anterolisthesis of C6 on C7 and C5 on C6. Left facet ankylosis at C5-6. Mild cervical disc degeneration. Other neck: No evidence of cervical lymphadenopathy or mass. Upper chest: Minimal dependent atelectasis in the superior segments of the lower lobes. Review of the MIP images confirms the above findings CTA HEAD FINDINGS Anterior circulation: The internal carotid arteries are patent from skull base to carotid termini with calcified plaque resulting in up to mild supraclinoid stenosis bilaterally. ACAs and MCAs are patent without evidence of a proximal branch occlusion or significant proximal stenosis. No aneurysm is identified. Posterior circulation: The intracranial vertebral arteries are patent to the basilar. Left V4 segment plaque results in luminal irregularity and up to mild stenosis. Patent PICA and SCA origins are identified bilaterally. The basilar artery is patent and mildly irregular without significant stenosis. Posterior communicating arteries are diminutive or absent. The  PCAs are patent with severe stenoses noted at the left P1-P2 junction and in the proximal right P3 segment. No aneurysm is identified. Venous sinuses: Patent. Anatomic variants: None. Review of the MIP images confirms the above findings CT Brain Perfusion Findings: ASPECTS: 10 CBF (<30%) Volume: 0 mL Perfusion (Tmax>6.0s) volume: 70mL, located along the floor of the left anterior cranial fossa and considered artifactual Mismatch Volume: n/a Infarction Location: None evident by CTP IMPRESSION: 1. No emergent large vessel occlusion. 2. Intracranial atherosclerosis with severe bilateral PCA stenoses. No significant proximal anterior circulation stenosis. 3. Widely patent cervical carotid and vertebral arteries. 4. Negative CTP. 5. Aortic Atherosclerosis (ICD10-I70.0). Electronically Signed   By: Logan Bores M.D.   On: 10/14/2019 19:37   CT HEAD CODE STROKE WO CONTRAST  Result Date: 10/14/2019 CLINICAL DATA:  Code stroke.  Right facial droop and slurred speech. EXAM: CT HEAD WITHOUT CONTRAST TECHNIQUE: Contiguous axial images were obtained from the base of the skull through the vertex without intravenous contrast. COMPARISON:  04/25/2018 FINDINGS: Brain: There is no evidence of an acute infarct, intracranial hemorrhage, mass, midline shift, or extra-axial fluid collection. Mild cerebral atrophy is unchanged. Hypodensities in the cerebral white matter bilaterally are also unchanged and nonspecific but compatible with  mild chronic small vessel ischemic disease. Vascular: Calcified atherosclerosis at the skull base. No hyperdense vessel. Skull: No fracture or suspicious osseous lesion. Sinuses/Orbits: Clear paranasal sinuses and right mastoid air cells. Unchanged large left mastoid effusion. Bilateral cataract extraction. Other: None. ASPECTS [facility] Stroke Program Early CT Score) - Ganglionic level infarction (caudate, lentiform nuclei, internal capsule, insula, M1-M3 cortex): 7 - Supraganglionic infarction (M4-M6  cortex): 3 Total score (0-10 with 10 being normal): 10 IMPRESSION: 1. No evidence of acute intracranial abnormality. 2. ASPECTS is 10. 3. Mild chronic small vessel ischemic disease. 4. Unchanged large left mastoid effusion. These results were called by telephone at the time of interpretation on 10/14/2019 at 6:36 pm to Dr. Fredia Sorrow, who verbally acknowledged these results. Electronically Signed   By: Logan Bores M.D.   On: 10/14/2019 18:37   CT ANGIO HEAD CODE STROKE  Result Date: 10/14/2019 CLINICAL DATA:  Right facial droop and slurred speech. EXAM: CT ANGIOGRAPHY HEAD AND NECK CT PERFUSION BRAIN TECHNIQUE: Multidetector CT imaging of the head and neck was performed using the standard protocol during bolus administration of intravenous contrast. Multiplanar CT image reconstructions and MIPs were obtained to evaluate the vascular anatomy. Carotid stenosis measurements (when applicable) are obtained utilizing NASCET criteria, using the distal internal carotid diameter as the denominator. Multiphase CT imaging of the brain was performed following IV bolus contrast injection. Subsequent parametric perfusion maps were calculated using RAPID software. CONTRAST:  153mL OMNIPAQUE IOHEXOL 350 MG/ML SOLN COMPARISON:  None. FINDINGS: CTA NECK FINDINGS Aortic arch: Normal variant aortic arch branching pattern with common origin of the brachiocephalic and left common carotid arteries. Mild aortic arch atherosclerosis without significant arch vessel origin stenosis. Right carotid system: Patent with minimal nonstenotic calcified plaque at the carotid bifurcation. Mildly ectatic appearance of the distal cervical ICA. Left carotid system: Patent with mild nonstenotic predominantly calcified plaque in the proximal ICA. Mildly ectatic appearance of the distal cervical ICA. Vertebral arteries: Patent without evidence of stenosis or dissection. Moderately dominant left vertebral artery. Skeleton: Moderately advanced cervical  facet arthrosis with grade 1 anterolisthesis of C6 on C7 and C5 on C6. Left facet ankylosis at C5-6. Mild cervical disc degeneration. Other neck: No evidence of cervical lymphadenopathy or mass. Upper chest: Minimal dependent atelectasis in the superior segments of the lower lobes. Review of the MIP images confirms the above findings CTA HEAD FINDINGS Anterior circulation: The internal carotid arteries are patent from skull base to carotid termini with calcified plaque resulting in up to mild supraclinoid stenosis bilaterally. ACAs and MCAs are patent without evidence of a proximal branch occlusion or significant proximal stenosis. No aneurysm is identified. Posterior circulation: The intracranial vertebral arteries are patent to the basilar. Left V4 segment plaque results in luminal irregularity and up to mild stenosis. Patent PICA and SCA origins are identified bilaterally. The basilar artery is patent and mildly irregular without significant stenosis. Posterior communicating arteries are diminutive or absent. The PCAs are patent with severe stenoses noted at the left P1-P2 junction and in the proximal right P3 segment. No aneurysm is identified. Venous sinuses: Patent. Anatomic variants: None. Review of the MIP images confirms the above findings CT Brain Perfusion Findings: ASPECTS: 10 CBF (<30%) Volume: 0 mL Perfusion (Tmax>6.0s) volume: 2mL, located along the floor of the left anterior cranial fossa and considered artifactual Mismatch Volume: n/a Infarction Location: None evident by CTP IMPRESSION: 1. No emergent large vessel occlusion. 2. Intracranial atherosclerosis with severe bilateral PCA stenoses. No significant proximal anterior circulation stenosis.  3. Widely patent cervical carotid and vertebral arteries. 4. Negative CTP. 5. Aortic Atherosclerosis (ICD10-I70.0). Electronically Signed   By: Logan Bores M.D.   On: 10/14/2019 19:37   CT ANGIO NECK CODE STROKE  Result Date: 10/14/2019 CLINICAL DATA:   Right facial droop and slurred speech. EXAM: CT ANGIOGRAPHY HEAD AND NECK CT PERFUSION BRAIN TECHNIQUE: Multidetector CT imaging of the head and neck was performed using the standard protocol during bolus administration of intravenous contrast. Multiplanar CT image reconstructions and MIPs were obtained to evaluate the vascular anatomy. Carotid stenosis measurements (when applicable) are obtained utilizing NASCET criteria, using the distal internal carotid diameter as the denominator. Multiphase CT imaging of the brain was performed following IV bolus contrast injection. Subsequent parametric perfusion maps were calculated using RAPID software. CONTRAST:  133mL OMNIPAQUE IOHEXOL 350 MG/ML SOLN COMPARISON:  None. FINDINGS: CTA NECK FINDINGS Aortic arch: Normal variant aortic arch branching pattern with common origin of the brachiocephalic and left common carotid arteries. Mild aortic arch atherosclerosis without significant arch vessel origin stenosis. Right carotid system: Patent with minimal nonstenotic calcified plaque at the carotid bifurcation. Mildly ectatic appearance of the distal cervical ICA. Left carotid system: Patent with mild nonstenotic predominantly calcified plaque in the proximal ICA. Mildly ectatic appearance of the distal cervical ICA. Vertebral arteries: Patent without evidence of stenosis or dissection. Moderately dominant left vertebral artery. Skeleton: Moderately advanced cervical facet arthrosis with grade 1 anterolisthesis of C6 on C7 and C5 on C6. Left facet ankylosis at C5-6. Mild cervical disc degeneration. Other neck: No evidence of cervical lymphadenopathy or mass. Upper chest: Minimal dependent atelectasis in the superior segments of the lower lobes. Review of the MIP images confirms the above findings CTA HEAD FINDINGS Anterior circulation: The internal carotid arteries are patent from skull base to carotid termini with calcified plaque resulting in up to mild supraclinoid stenosis  bilaterally. ACAs and MCAs are patent without evidence of a proximal branch occlusion or significant proximal stenosis. No aneurysm is identified. Posterior circulation: The intracranial vertebral arteries are patent to the basilar. Left V4 segment plaque results in luminal irregularity and up to mild stenosis. Patent PICA and SCA origins are identified bilaterally. The basilar artery is patent and mildly irregular without significant stenosis. Posterior communicating arteries are diminutive or absent. The PCAs are patent with severe stenoses noted at the left P1-P2 junction and in the proximal right P3 segment. No aneurysm is identified. Venous sinuses: Patent. Anatomic variants: None. Review of the MIP images confirms the above findings CT Brain Perfusion Findings: ASPECTS: 10 CBF (<30%) Volume: 0 mL Perfusion (Tmax>6.0s) volume: 86mL, located along the floor of the left anterior cranial fossa and considered artifactual Mismatch Volume: n/a Infarction Location: None evident by CTP IMPRESSION: 1. No emergent large vessel occlusion. 2. Intracranial atherosclerosis with severe bilateral PCA stenoses. No significant proximal anterior circulation stenosis. 3. Widely patent cervical carotid and vertebral arteries. 4. Negative CTP. 5. Aortic Atherosclerosis (ICD10-I70.0). Electronically Signed   By: Logan Bores M.D.   On: 10/14/2019 19:37    Scheduled Meds:   stroke: mapping our early stages of recovery book   Does not apply Once   aspirin  300 mg Rectal Daily   Or   aspirin  325 mg Oral Daily   dorzolamide  1 drop Both Eyes BID   heparin  5,000 Units Subcutaneous Q8H   latanoprost  1 drop Both Eyes QHS   levothyroxine  50 mcg Intravenous Daily   timolol  1 drop Both Eyes  BID   Continuous Infusions:  sodium chloride 75 mL/hr at 10/14/19 2238     LOS: 1 day    Time spent: 30 minutes.    Barton Dubois, MD [facility]   To contact the attending provider between 7A-7P or the  covering provider during after hours 7P-7A, please log into the web site [email or web address] and access using universal [place] password for that web site. If you do not have the password, please call the hospital operator.  10/15/2019, 7:32 AM

## 2019-10-15 NOTE — ED Notes (Signed)
Report received 

## 2019-10-15 NOTE — ED Notes (Signed)
Pt is more alert   She speaks with slurred speech asking to get up   She is reminded that she has had a stroke   She has taken her leads off as well as pulse ox and oxygen   She is reattached and reassured   Awaits transfer to [facility]/bed assignment

## 2019-10-15 NOTE — ED Notes (Signed)
Dr Dyann Kief has assessed and his finding is better than this nurse   He reports pt is hard of hearing and when he shouted, she responded

## 2019-10-15 NOTE — ED Notes (Signed)
Report to [place], RN [employer]

## 2019-10-15 NOTE — ED Notes (Signed)
Dr Dyann Kief is informed of pt change  Stroke scale is greatly different   He will come and assess

## 2019-10-16 ENCOUNTER — Inpatient Hospital Stay (HOSPITAL_COMMUNITY): Payer: PPO

## 2019-10-16 DIAGNOSIS — I6302 Cerebral infarction due to thrombosis of basilar artery: Secondary | ICD-10-CM

## 2019-10-16 LAB — GLUCOSE, CAPILLARY
Glucose-Capillary: 123 mg/dL — ABNORMAL HIGH (ref 70–99)
Glucose-Capillary: 116 mg/dL — ABNORMAL HIGH (ref 70–99)
Glucose-Capillary: 116 mg/dL — ABNORMAL HIGH (ref 70–99)
Glucose-Capillary: 104 mg/dL — ABNORMAL HIGH (ref 70–99)
Glucose-Capillary: 123 mg/dL — ABNORMAL HIGH (ref 70–99)
Glucose-Capillary: 166 mg/dL — ABNORMAL HIGH (ref 70–99)

## 2019-10-16 MED ORDER — INSULIN ASPART 100 UNIT/ML ~~LOC~~ SOLN
0.0000 [IU] | Freq: Three times a day (TID) | SUBCUTANEOUS | Status: DC
  Administered 2019-10-17: 1 [IU] via SUBCUTANEOUS
  Administered 2019-10-18: 2 [IU] via SUBCUTANEOUS
  Administered 2019-10-18 – 2019-10-19 (×3): 1 [IU] via SUBCUTANEOUS

## 2019-10-16 MED ORDER — ATORVASTATIN CALCIUM 40 MG PO TABS
40.0000 mg | ORAL_TABLET | Freq: Every day | ORAL | Status: DC
  Administered 2019-10-16 – 2019-10-19 (×4): 40 mg via ORAL
  Filled 2019-10-16 (×4): qty 1

## 2019-10-16 MED ORDER — ASPIRIN EC 81 MG PO TBEC
81.0000 mg | DELAYED_RELEASE_TABLET | Freq: Every day | ORAL | Status: DC
  Administered 2019-10-17 – 2019-10-19 (×3): 81 mg via ORAL
  Filled 2019-10-16 (×3): qty 1

## 2019-10-16 MED ORDER — INSULIN ASPART 100 UNIT/ML ~~LOC~~ SOLN: 0.0000 [IU] | Freq: Three times a day (TID) | SUBCUTANEOUS | Status: DC

## 2019-10-16 MED ORDER — ATORVASTATIN CALCIUM 80 MG PO TABS: 80.0000 mg | ORAL_TABLET | Freq: Every day | ORAL | Status: DC

## 2019-10-16 MED ORDER — CLOPIDOGREL BISULFATE 75 MG PO TABS
75.0000 mg | ORAL_TABLET | Freq: Every day | ORAL | Status: DC
  Administered 2019-10-16 – 2019-10-19 (×4): 75 mg via ORAL
  Filled 2019-10-16 (×4): qty 1

## 2019-10-16 NOTE — Progress Notes (Signed)
Occupational Therapy Evaluation  Patient with functional deficits listed below impacting safety and independence with self care. Patient with R dominant hemiparesis R UE flaccid and~3/5 shoulder elevation and trace scapular retraction. Patient requiring max A for supine to sit with initial mod A to maintain static sitting. With cues for posture and placement of chair patient able to hold onto chair with L UE and maintain static sitting for approx 5 minutes with close supervision. Patient currently requiring max to total A for self care, recommend rehab at discharge to maximize patient safety and independence with ADLs and functional transfers.     10/16/19 1459  OT Visit Information  Last OT Received On 10/16/19  Assistance Needed +2  History of Present Illness Patient is a 86 y/o female who presents with slurred speech, right weakness. NIH:4. Brain MRI- acute infarct left pons. PMH includes HTN, high cholesterol, DM.  Precautions  Precautions Fall  Restrictions  Weight Bearing Restrictions No  Home Living  Family/patient expects to be discharged to: Private residence  Living Arrangements Alone  Available Help at Discharge Family  Type of [place] to enter  Entrance Stairs-Number of Steps 2  Entrance Stairs-Rails Right;Left;Can reach both  [place] One level  Bathroom Shower/Tub Tub/shower unit;Walk-in Patent examiner - 2 wheels;Cane - single point;BSC  Prior Function  Level of Independence Independent with assistive device(s)  Comments Reports using RW for ambulation and independent for ADLs  Communication  Communication HOH;Expressive difficulties  Pain Assessment  Pain Assessment Faces  Faces Pain Scale 4  Pain Location buttock and back  Pain Descriptors / Indicators Sore;Grimacing  Pain Intervention(s) Monitored during session  Cognition  Arousal/Alertness Awake/alert  Behavior During Therapy Anxious;Flat  affect  Overall Cognitive Status Difficult to assess  General Comments patient following 1 step commands with increased  time, is anxious at edge of bed trying to grab onto therapist's pant leg or bed rail requiring cues to redirect  Difficult to assess due to Hard of hearing/deaf;Impaired communication (expressive difficulties)  Upper Extremity Assessment  Upper Extremity Assessment RUE deficits/detail;LUE deficits/detail  RUE Deficits / Details R UE flaccid with 3/5 shoulder elevation and trace scapular retraction  RUE Sensation  (grossly intact for localized light touch)  RUE Coordination decreased fine motor;decreased gross motor  LUE Deficits / Details grossly intact  Lower Extremity Assessment  Lower Extremity Assessment Defer to PT evaluation  Cervical / Trunk Assessment  Cervical / Trunk Assessment Kyphotic  ADL  Overall ADL's  Needs assistance/impaired  Eating/Feeding Maximal assistance  Grooming Maximal assistance  Upper Body Bathing Maximal assistance  Lower Body Bathing Total assistance  Upper Body Dressing  Maximal assistance  Lower Body Dressing Total assistance  Toilet Transfer Details (indicate cue type and reason) deferred due to limited sitting balance at EOB, would need +2 assist for safety to attempt  Toileting- Clothing Manipulation and Hygiene Total assistance  Functional mobility during ADLs Maximal assistance  General ADL Comments patient requiring increased assistance with self care due to hemiplegia, decreased coordination, strength, balance, activity tolerance, safety  Vision- History  Baseline Vision/History Wears glasses  Wears Glasses At all times  Bed Mobility  Overal bed mobility Needs Assistance  Bed Mobility Supine to Sit;Sit to Supine  Supine to sit Max assist;HOB elevated  Sit to supine Total assist;HOB elevated  General bed mobility comments assist with LEs, utilized bed pad to elevate trunk to EOB, total A to lift LEs and guide  trunk to return  sitting  Transfers  General transfer comment Deferred due to poor sitting balance and needing second person.  Balance  Overall balance assessment Needs assistance  Sitting-balance support Feet supported;Single extremity supported  Sitting balance-Leahy Scale Poor  Sitting balance - Comments patient initially requiring mod assist for static sitting with posterior lean, provided patient back of chair to hold onto with L UE and able to maintain with close supervision for ~5 minutes with cues for posture  Postural control Posterior lean  General Comments  General comments (skin integrity, edema, etc.) O2 97% on 3L HR in mid 70s seated EOB  OT - End of Session  Equipment Utilized During Treatment Oxygen  Activity Tolerance Patient tolerated treatment well  Patient left in bed;with call bell/phone within reach;with bed alarm set;with family/visitor present  Nurse Communication Mobility status (RN ordering prevalon boot due to L LE IR)  OT Assessment  OT Recommendation/Assessment Patient needs continued OT Services  OT Visit Diagnosis Other abnormalities of gait and mobility (R26.89);Muscle weakness (generalized) (M62.81);Other symptoms and signs involving cognitive function;Hemiplegia and hemiparesis  Hemiplegia - Right/Left Right  Hemiplegia - dominant/non-dominant Dominant  Hemiplegia - caused by Cerebral infarction  OT Problem List Decreased strength;Decreased range of motion;Decreased activity tolerance;Impaired balance (sitting and/or standing);Decreased coordination;Decreased cognition;Decreased safety awareness;Decreased knowledge of use of DME or AE;Impaired tone;Impaired UE functional use  OT Plan  OT Frequency (ACUTE ONLY) Min 2X/week  OT Treatment/Interventions (ACUTE ONLY) Self-care/ADL training;Therapeutic exercise;Neuromuscular education;DME and/or AE instruction;Therapeutic activities;Cognitive remediation/compensation;Patient/family education;Balance training  AM-PAC OT "6  Clicks" Daily Activity Outcome Measure (Version 2)  Help from another person eating meals? 2  Help from another person taking care of personal grooming? 2  Help from another person toileting, which includes using toliet, bedpan, or urinal? 1  Help from another person bathing (including washing, rinsing, drying)? 1  Help from another person to put on and taking off regular upper body clothing? 2  Help from another person to put on and taking off regular lower body clothing? 1  6 Click Score 9  OT Recommendation  Follow Up Recommendations SNF;Supervision/Assistance - 24 hour  OT Equipment Other (comment) (TBD)  Individuals Consulted  Consulted and Agree with Results and Recommendations Family member/caregiver  Family Member Consulted granddaughter   Acute Rehab OT Goals  Patient Stated Goal to get better  OT Goal Formulation With patient/family  Time For Goal Achievement 10/30/19  Potential to Achieve Goals Good  OT Time Calculation  OT Start Time (ACUTE ONLY) 1334  OT Stop Time (ACUTE ONLY) 1402  OT Time Calculation (min) 28 min  OT General Charges  $OT Visit 1 Visit  OT Evaluation  $OT Eval Moderate Complexity 1 Mod  OT Treatments  $Self Care/Home Management  8-22 mins  Written Expression  Dominant Hand Right   Kathleen [name] OT OT pager: [phone]

## 2019-10-16 NOTE — Evaluation (Signed)
Clinical/Bedside Swallow Evaluation Patient Details  Name: Kathleen [name] MRN: [id number] Date of Birth: 05/13/1931  Today's Date: 10/16/2019 Time: SLP Start Time (ACUTE ONLY): 1200 SLP Stop Time (ACUTE ONLY): 1220 SLP Time Calculation (min) (ACUTE ONLY): 20 min  Past Medical History:  Past Medical History:  Diagnosis Date  . Arthritis   . Diabetes mellitus without complication ([place])    "borderline"  . Diverticulosis of colon   . High cholesterol   . Hypertension   . Hypothyroidism   . Pancreatitis    Past Surgical History:  Past Surgical History:  Procedure Laterality Date  . ABDOMINAL HYSTERECTOMY     partial  . APPENDECTOMY    . bladder tac    . CHOLECYSTECTOMY  1999   cholelithiasis  . COLONOSCOPY  01/2007   Dr Olevia Perches: normal  . COLONOSCOPY  12/2000   Dr Deatra Ina- diverticulosis  . ESOPHAGOGASTRODUODENOSCOPY     Dr Damaris Hippo cannot remember  . EUS N/A 03/24/2012   Dr. Paulita Fujita: suspected passed CBD stone as cause of acute pancreatitis, suspective chronic pancreatitis.   Marland Kitchen EXCISION OF BREAST BIOPSY    . HIP PINNING,CANNULATED Left 01/15/2015   Procedure: CANNULATED HIP PINNING/LEFT;  Surgeon: Renette Butters, MD;  Location: [facility];  Service: Orthopedics;  Laterality: Left;  Marland Kitchen VEIN SURGERY     HPI:  Pt is an 86 y.o. female, with past medical history of diabetes mellitus, GERD, hypertension, hypothyroidism, pancreatitis, who presented to the ED as code stroke due to difficulty with speech and right-sided weakness. CT head was negative for acute changes. CT angiogram head and neck negative for large vessel occlusion, intracranial atherosclerosis and severe bilateral PCA stenosis. MRI brain 9/5: Acute infarct of the left pons. [facility] Swallow Screening was failed.    Assessment / Plan / Recommendation Clinical Impression  Pt was seen for bedside swallow evaluation with her granddaughter present towards the end of the session. Pt's family reported that the pt was independent PTA and  managed her own medications and bills. She denied the pt having any symptoms of oropharyngeal dysphagia at baseline. Oral mechanism exam was significant for right sided facial weakness and reduced lingual strength and ROM. She wore maxillary and mandibular dentures but the maxillary dentures were ill-fitting. Pt's loose dentures and imprecise articulation both negatively impacted speech intelligibility. She presented with symptoms of oropharyngeal dysphagia characterized by impaired mastication, prolonged bolus formation, anterior spillage of puree and liquids, right sided lateral sulcus residue, and coughing with 1/6 boluses of thin liquids via straw. A dysphagia 1 (puree) diet with thin liquids is recommended at this time and SLP will follow for dysphagia treatment.  SLP Visit Diagnosis: Dysphagia, oropharyngeal phase (R13.12)    Aspiration Risk  Mild aspiration risk    Diet Recommendation Dysphagia 1 (Puree);Thin liquid   Liquid Administration via: Cup;Straw Medication Administration: Crushed with puree Supervision: Full supervision/cueing for compensatory strategies Compensations: Slow rate;Small sips/bites;Monitor for anterior loss;Follow solids with liquid;Lingual sweep for clearance of pocketing Postural Changes: Seated upright at 90 degrees    Other  Recommendations Oral Care Recommendations: Oral care BID   Follow up Recommendations Skilled Nursing facility      Frequency and Duration min 2x/week  2 weeks       Prognosis Prognosis for Safe Diet Advancement: Good Barriers to Reach Goals: Severity of deficits      Swallow Study   General Date of Onset: 10/15/19 HPI: Pt is an 86 y.o. female, with past medical history of diabetes mellitus, GERD, hypertension,  hypothyroidism, pancreatitis, who presented to the ED as code stroke due to difficulty with speech and right-sided weakness. CT head was negative for acute changes. CT angiogram head and neck negative for large vessel  occlusion, intracranial atherosclerosis and severe bilateral PCA stenosis. MRI brain 9/5: Acute infarct of the left pons. [facility] Swallow Screening was failed.  Type of Study: Bedside Swallow Evaluation Previous Swallow Assessment: None Diet Prior to this Study: NPO Temperature Spikes Noted: No Respiratory Status: Nasal cannula History of Recent Intubation: No Behavior/Cognition: Alert;Cooperative;Pleasant mood Oral Cavity Assessment: Within Functional Limits Oral Care Completed by SLP: No Oral Cavity - Dentition: Dentures, top;Dentures, bottom;Other (Comment) (ill-fitting) Vision: Functional for self-feeding Self-Feeding Abilities: Able to feed self Patient Positioning: Upright in bed Baseline Vocal Quality: Normal Volitional Cough: Weak Volitional Swallow: Able to elicit    Oral/Motor/Sensory Function Overall Oral Motor/Sensory Function: Moderate impairment Facial ROM: Reduced right Facial Symmetry: Abnormal symmetry right;Suspected CN VII (facial) dysfunction Facial Strength: Reduced right;Suspected CN VII (facial) dysfunction Facial Sensation: Within Functional Limits Lingual ROM: Reduced right;Reduced left Lingual Symmetry: Within Functional Limits Lingual Strength: Reduced;Suspected CN XII (hypoglossal) dysfunction Lingual Sensation: Within Functional Limits Velum: Within Functional Limits   Ice Chips Ice chips: Within functional limits Presentation: Spoon   Thin Liquid Thin Liquid: Impaired Presentation: Straw Oral Phase Functional Implications: Right anterior spillage Pharyngeal  Phase Impairments: Cough - Immediate (with 1/6 boluses)    Nectar Thick Nectar Thick Liquid: Not tested   Honey Thick Honey Thick Liquid: Not tested   Puree Puree: Impaired Oral Phase Impairments: Reduced labial seal;Poor awareness of bolus;Reduced lingual movement/coordination Oral Phase Functional Implications: Right anterior spillage;Right lateral sulci pocketing   Solid     Solid:  Impaired Oral Phase Impairments: Impaired mastication;Poor awareness of bolus;Reduced lingual movement/coordination Oral Phase Functional Implications: Impaired mastication;Prolonged oral transit;Oral residue     Willella Harding I. Hardin Negus, [place], [place] Office number [phone] Pager [phone]  Horton Marshall 10/16/2019,1:46 PM

## 2019-10-16 NOTE — Evaluation (Signed)
Physical Therapy Evaluation Patient Details Name: OLIVER NEUWIRTH MRN: [id number] DOB: 1931-10-06 Today's Date: 10/16/2019   History of Present Illness  Patient is a 86 y/o female who presents with slurred speech, right weakness. NIH:4. Brain MRI- acute infarct left pons. PMH includes HTN, high cholesterol, DM.  Clinical Impression  Patient presents with right hemiplegia, pain, dysarthria, impaired balance and impaired mobility s/p above. Pt reports living at home alone and being Mod I with RW for ambulation/ADLs. Today, pt requires Max-total A for bed mobility and sitting balance due to posterior lean and fear of falling. Deferred standing as needed second person for assist/safety. VSS on supplemental 02. Would benefit from SNF to maximize independence and mobility prior to return home. Will follow acutely.    Follow Up Recommendations SNF;Supervision for mobility/OOB;Supervision/Assistance - 24 hour    Equipment Recommendations  Other (comment) (defer to next venue)    Recommendations for Other Services       Precautions / Restrictions        Mobility  Bed Mobility Overal bed mobility: Needs Assistance Bed Mobility: Supine to Sit;Sit to Supine     Supine to sit: Max assist;HOB elevated Sit to supine: Total assist;HOB elevated   General bed mobility comments: Assist with LEs, bottom and to elevate trunk to get to EOB; assist to lower to return to supine. Poor initiation. Posterior pelvic tilt and posterior lean.  Transfers                 General transfer comment: Deferred due to poor sitting balance and needing second person.  Ambulation/Gait             General Gait Details: unable  Stairs            Wheelchair Mobility    Modified Rankin (Stroke Patients Only) Modified Rankin (Stroke Patients Only) Pre-Morbid Rankin Score: Moderate disability Modified Rankin: Severe disability     Balance Overall balance assessment: Needs  assistance Sitting-balance support: Feet supported;Bilateral upper extremity supported Sitting balance-Leahy Scale: Poor Sitting balance - Comments: Requires Mod A-Min guard assist with posterior pelvic tilt and posterior lean esp with MMT of LEs. Postural control: Posterior lean                                   Pertinent Vitals/Pain Pain Assessment: Faces Faces Pain Scale: Hurts even more Pain Location: LEs with movement Pain Descriptors / Indicators: Grimacing;Guarding;Sore Pain Intervention(s): Monitored during session;Limited activity within patient's tolerance;Repositioned    Home Living Family/patient expects to be discharged to:: Skilled nursing facility Living Arrangements: Alone Available Help at Discharge: Family Type of Home: House Home Access: Stairs to enter Entrance Stairs-Rails: Right;Left;Can reach both Entrance Stairs-Number of Steps: 2 Home Layout: One level Home Equipment: Walker - 2 wheels;Cane - single point;Bedside commode      Prior Function Level of Independence: Independent with assistive device(s)         Comments: Reports using RW for ambulation and independent for ADLs, drives.     Hand Dominance        Extremity/Trunk Assessment   Upper Extremity Assessment Upper Extremity Assessment: Defer to OT evaluation (flaccidity RUE, weak grip)    Lower Extremity Assessment Lower Extremity Assessment: RLE deficits/detail RLE Deficits / Details: Grossly ~1/5 hip flexion, 0/5 throughout; some arthritis as well and pain with PROM. RLE Sensation: decreased light touch    Cervical / Trunk Assessment Cervical / Trunk  Assessment: Kyphotic  Communication   Communication: HOH;Expressive difficulties  Cognition Arousal/Alertness: Lethargic Behavior During Therapy: Flat affect Overall Cognitive Status: Impaired/Different from baseline Area of Impairment: Orientation;Following commands;Problem solving;Memory                  Orientation Level: Disoriented to;Place   Memory: Decreased short-term memory Following Commands: Follows one step commands with increased time     Problem Solving: Slow processing;Decreased initiation;Difficulty sequencing;Requires verbal cues;Requires tactile cues General Comments: Continually asking when she is going to be taken to the hospital despite being told that is where she was x2. Very HOH, requires repetition.      General Comments General comments (skin integrity, edema, etc.): VSS on supplemental 02.    Exercises     Assessment/Plan    PT Assessment Patient needs continued PT services  PT Problem List Decreased strength;Decreased mobility;Impaired tone;Decreased range of motion;Decreased coordination;Decreased activity tolerance;Decreased cognition;Impaired sensation;Decreased balance       PT Treatment Interventions Therapeutic activities;Gait training;Therapeutic exercise;Patient/family education;Wheelchair mobility training;Balance training;Functional mobility training;Neuromuscular re-education;DME instruction    PT Goals (Current goals can be found in the Care Plan section)  Acute Rehab PT Goals Patient Stated Goal: to get better PT Goal Formulation: With patient Time For Goal Achievement: 10/30/19 Potential to Achieve Goals: Fair    Frequency Min 3X/week   Barriers to discharge Decreased caregiver support lives alone    Co-evaluation               AM-PAC PT "6 Clicks" Mobility  Outcome Measure Help needed turning from your back to your side while in a flat bed without using bedrails?: A Lot Help needed moving from lying on your back to sitting on the side of a flat bed without using bedrails?: Total Help needed moving to and from a bed to a chair (including a wheelchair)?: Total Help needed standing up from a chair using your arms (e.g., wheelchair or bedside chair)?: Total Help needed to walk in hospital room?: Total Help needed climbing 3-5  steps with a railing? : Total 6 Click Score: 7    End of Session   Activity Tolerance: Patient tolerated treatment well Patient left: in bed;with call bell/phone within reach;with bed alarm set;with SCD's reapplied Nurse Communication: Mobility status;Need for lift equipment PT Visit Diagnosis: Hemiplegia and hemiparesis;Difficulty in walking, not elsewhere classified (R26.2);Unsteadiness on feet (R26.81) Hemiplegia - Right/Left: Right Hemiplegia - dominant/non-dominant: Dominant Hemiplegia - caused by: Cerebral infarction    Time: 0750-0813 PT Time Calculation (min) (ACUTE ONLY): 23 min   Charges:   PT Evaluation $PT Eval Moderate Complexity: 1 Mod PT Treatments $Therapeutic Activity: 8-22 mins        Marisa Severin, PT, DPT Acute Rehabilitation Services Pager [phone] Office [phone]      Marguarite Arbour A Sabra Heck 10/16/2019, 12:56 PM

## 2019-10-16 NOTE — Plan of Care (Signed)
Discussed with patient plan of care for the evening, pain management, mobility and desire to have medications crushed in anything other than applesauce with some teach back displayed

## 2019-10-16 NOTE — Progress Notes (Signed)
[facility]  PROGRESS NOTE  SAI MOURA OHY:[id number] DOB: [birth year] DOA: 10/14/2019 PCP: Aletha Halim., PA-C   Brief HPI:   86 year old female with past medical history of diabetes mellitus type 2, hypertension, GERD, hypothyroidism, chronic pancreatitis came to ED as a code stroke.  Patient did her self when she was seen normal around 5 PM.  Per granddaughter she complained of left knee pain and went to sit on the recliner she went back to check on her again at 5:15 PM patient noted to have slurred speech, right-sided weakness and EMS was called.  In the ED patient was not found to be a TPA candidate since she was out of window.  CTA head and neck showed no large vessel occlusion, intracranial atherosclerosis and severe bilateral PCA stenosis.  CT head showed no evidence of acute stroke. [facility] was [facility] to [facility] for further evaluation.   Subjective   Patient seen and examined, continues to have dysarthria, right side weakness   Assessment/Plan:     1. Acute infarct left pons-confirmed on the MRI brain.  Continue aspirin 300 mg rectally daily, patient has failed swallow evaluation.  Echocardiogram done yesterday showed no source of embolus, EF is 60 to 65%.  CTA head and neck done on 10/14/2019 showed no emergent large vessel occlusion, intracranial atherosclerosis with severe bilateral PCA stenosis.  No significant proximal anterior circulation stenosis.  LDL is 134, goal is less than 70.  We will start statins once patient able to take by mouth.  PT eval is pending.  Awaiting neurology recommendations. 2. Hypothyroidism-patient is on IV levothyroxine.  TSH is 6.55.  TSH mildly elevated, likely sick euthyroid syndrome.  Will need repeat TSH in 6 to 8 weeks.  Continue same dose of Synthroid. 3. Diabetes mellitus type 2-start sliding scale insulin NovoLog.  Check CBG every 4 hours. 4. GERD without esophagitis-continue PPI 5. Hypertension-amlodipine and  Prinivil on hold for permissive hypertension 6. History of glaucoma-continue Xalatan, Cosopt     COVID-19 Labs  No results for input(s): DDIMER, FERRITIN, LDH, CRP in the last 72 hours.  Lab Results  Component Value Date   [place] NEGATIVE 10/14/2019     Scheduled medications:   .  stroke: mapping our early stages of recovery book   Does not apply Once  . aspirin  300 mg Rectal Daily   Or  . aspirin  325 mg Oral Daily  . dorzolamide  1 drop Both Eyes BID  . heparin  5,000 Units Subcutaneous Q8H  . latanoprost  1 drop Both Eyes QHS  . levothyroxine  50 mcg Intravenous Daily  . pantoprazole (PROTONIX) IV  40 mg Intravenous Q24H  . timolol  1 drop Both Eyes BID         CBG: Recent Labs  Lab 10/15/19 2112 10/15/19 2305 10/16/19 0309 10/16/19 0745 10/16/19 1147  GLUCAP 110* 100* 123* 116* 116*    SpO2: 99 % O2 Flow Rate (L/min): 3 L/min    CBC: Recent Labs  Lab 10/14/19 1823 10/14/19 1827  WBC 8.6  --   NEUTROABS 6.3  --   HGB 13.8 13.9  HCT 40.5 41.0  MCV 90.8  --   PLT 252  --     Basic Metabolic Panel: Recent Labs  Lab 10/14/19 1827 10/14/19 1853  NA 134* 132*  K 4.6 3.7  CL 101 100  CO2  --  20*  GLUCOSE 158* 160*  BUN 15 14  CREATININE 0.60  0.59  CALCIUM  --  9.1  MG  --  1.9     Liver Function Tests: Recent Labs  Lab 10/14/19 1853  AST 24  ALT 16  ALKPHOS 74  BILITOT 0.6  PROT 7.1  ALBUMIN 4.1     Antibiotics: Anti-infectives (From admission, onward)   None       DVT prophylaxis: Heparin  Code Status: DNR  Family Communication: No family at bedside    Status is: Inpatient  Dispo: The patient is from: Home              Anticipated d/c is to: Skilled nursing facility              Anticipated d/c date is: 10/18/2019              Patient currently not medically stable for discharge  Barrier to discharge-stroke work-up underway, physical therapy evaluation pending           Consultants:  Neurology  Procedures:  Echocardiogram   Objective   Vitals:   10/16/19 0205 10/16/19 0300 10/16/19 0400 10/16/19 0744  BP:    (!) 129/57  Pulse:    89  Resp: 15 16 19 19   Temp:    99.3 F (37.4 C)  TempSrc:      SpO2: 98% 99% 98% 99%  Weight:      Height:        Intake/Output Summary (Last 24 hours) at 10/16/2019 1212 Last data filed at 10/16/2019 0800 Gross per 24 hour  Intake 593.79 ml  Output 400 ml  Net 193.79 ml    09/03 1901 - 09/05 0700 In: 737 [I.V.:294] Out: 200 [Urine:200]  Filed Weights   10/15/19 2325  Weight: 65 kg    Physical Examination:    General: Appears in no acute distress  Cardiovascular: S1-S2, regular, no murmur auscultated  Respiratory: Clear to auscultation bilaterally  Abdomen: Abdomen is soft, nontender, no organomegaly  Extremities: No edema in the lower extremities  Neurologic: Alert, dysarthria, right hemiparesis, expressive aphasia    Data Reviewed:   Recent Results (from the past 240 hour(s))  SARS Coronavirus 2 by RT PCR (hospital order, performed in [place] hospital lab) Nasopharyngeal Nasopharyngeal Swab     Status: None   Collection Time: 10/14/19  8:51 PM   Specimen: Nasopharyngeal Swab  Result Value Ref Range Status   SARS Coronavirus 2 NEGATIVE NEGATIVE Final    Comment: (NOTE) SARS-CoV-2 target nucleic acids are NOT DETECTED.  The SARS-CoV-2 RNA is generally detectable in upper and lower respiratory specimens during the acute phase of infection. The lowest concentration of SARS-CoV-2 viral copies this assay can detect is 250 copies / mL. A negative result does not preclude SARS-CoV-2 infection and should not be used as the sole basis for treatment or other patient management decisions.  A negative result may occur with improper specimen collection / handling, submission of specimen other than nasopharyngeal swab, presence of viral mutation(s) within the areas targeted by  this assay, and inadequate number of viral copies (<250 copies / mL). A negative result must be combined with clinical observations, patient history, and epidemiological information.  Fact Sheet for Patients:   StrictlyIdeas.no  Fact Sheet for Healthcare Providers: BankingDealers.co.za  This test is not yet approved or  cleared by the [place] FDA and has been authorized for detection and/or diagnosis of SARS-CoV-2 by FDA under an Emergency Use Authorization (EUA).  This EUA will remain in effect (meaning this test  can be used) for the duration of the COVID-19 declaration under Section 564(b)(1) of the Act, 21 U.S.C. section 360bbb-3(b)(1), unless the authorization is terminated or revoked sooner.  Performed at [facility], [address], [place],  [address]     No results for input(s): LIPASE, AMYLASE in the last 168 hours. No results for input(s): AMMONIA in the last 168 hours.  Cardiac Enzymes: No results for input(s): CKTOTAL, CKMB, CKMBINDEX, TROPONINI in the last 168 hours. BNP (last 3 results) No results for input(s): BNP in the last 8760 hours.  ProBNP (last 3 results) No results for input(s): PROBNP in the last 8760 hours.  Studies:  MR BRAIN WO CONTRAST  Result Date: 10/16/2019 CLINICAL DATA:  Right-sided weakness and speech changes EXAM: MRI HEAD WITHOUT CONTRAST TECHNIQUE: Multiplanar, multiecho pulse sequences of the brain and surrounding structures were obtained without intravenous contrast. COMPARISON:  None. FINDINGS: Brain: There is an acute infarct of the left pons. No other acute ischemia. No acute hemorrhage. Multifocal hyperintense T2-weighted signal within the white matter. There is generalized atrophy without lobar predilection. Chronic microhemorrhage in the right thalamus. Old blood products in the right temporal lobe. Normal midline structures. Vascular: Normal flow voids. Skull and upper cervical  spine: Normal marrow signal. Sinuses/Orbits: Left mastoid effusion. Bilateral ocular lens replacements. Other: None IMPRESSION: 1. Acute infarct of the left pons. No hemorrhage or mass effect. 2. Generalized atrophy and findings of chronic small vessel disease. Electronically Signed   By: Ulyses Jarred M.D.   On: 10/16/2019 01:24   CT CEREBRAL PERFUSION W CONTRAST  Result Date: 10/14/2019 CLINICAL DATA:  Right facial droop and slurred speech. EXAM: CT ANGIOGRAPHY HEAD AND NECK CT PERFUSION BRAIN TECHNIQUE: Multidetector CT imaging of the head and neck was performed using the standard protocol during bolus administration of intravenous contrast. Multiplanar CT image reconstructions and MIPs were obtained to evaluate the vascular anatomy. Carotid stenosis measurements (when applicable) are obtained utilizing NASCET criteria, using the distal internal carotid diameter as the denominator. Multiphase CT imaging of the brain was performed following IV bolus contrast injection. Subsequent parametric perfusion maps were calculated using RAPID software. CONTRAST:  113mL OMNIPAQUE IOHEXOL 350 MG/ML SOLN COMPARISON:  None. FINDINGS: CTA NECK FINDINGS Aortic arch: Normal variant aortic arch branching pattern with common origin of the brachiocephalic and left common carotid arteries. Mild aortic arch atherosclerosis without significant arch vessel origin stenosis. Right carotid system: Patent with minimal nonstenotic calcified plaque at the carotid bifurcation. Mildly ectatic appearance of the distal cervical ICA. Left carotid system: Patent with mild nonstenotic predominantly calcified plaque in the proximal ICA. Mildly ectatic appearance of the distal cervical ICA. Vertebral arteries: Patent without evidence of stenosis or dissection. Moderately dominant left vertebral artery. Skeleton: Moderately advanced cervical facet arthrosis with grade 1 anterolisthesis of C6 on C7 and C5 on C6. Left facet ankylosis at C5-6. Mild  cervical disc degeneration. Other neck: No evidence of cervical lymphadenopathy or mass. Upper chest: Minimal dependent atelectasis in the superior segments of the lower lobes. Review of the MIP images confirms the above findings CTA HEAD FINDINGS Anterior circulation: The internal carotid arteries are patent from skull base to carotid termini with calcified plaque resulting in up to mild supraclinoid stenosis bilaterally. ACAs and MCAs are patent without evidence of a proximal branch occlusion or significant proximal stenosis. No aneurysm is identified. Posterior circulation: The intracranial vertebral arteries are patent to the basilar. Left V4 segment plaque results in luminal irregularity and up to mild stenosis. Patent  PICA and SCA origins are identified bilaterally. The basilar artery is patent and mildly irregular without significant stenosis. Posterior communicating arteries are diminutive or absent. The PCAs are patent with severe stenoses noted at the left P1-P2 junction and in the proximal right P3 segment. No aneurysm is identified. Venous sinuses: Patent. Anatomic variants: None. Review of the MIP images confirms the above findings CT Brain Perfusion Findings: ASPECTS: 10 CBF (<30%) Volume: 0 mL Perfusion (Tmax>6.0s) volume: 76mL, located along the floor of the left anterior cranial fossa and considered artifactual Mismatch Volume: n/a Infarction Location: None evident by CTP IMPRESSION: 1. No emergent large vessel occlusion. 2. Intracranial atherosclerosis with severe bilateral PCA stenoses. No significant proximal anterior circulation stenosis. 3. Widely patent cervical carotid and vertebral arteries. 4. Negative CTP. 5. Aortic Atherosclerosis (ICD10-I70.0). Electronically Signed   By: Logan Bores M.D.   On: 10/14/2019 19:37   ECHOCARDIOGRAM COMPLETE  Result Date: 10/15/2019    ECHOCARDIOGRAM REPORT   Patient Name:   DEBANY VANTOL Date of Exam: 10/15/2019 Medical Rec #:  [id number]     Height:        65.0 in Accession #:    [id number]    Weight:       135.0 lb Date of Birth:  Oct 04, 1931    BSA:          1.674 m Patient Age:    63 years      BP:           125/62 mmHg Patient Gender: F             HR:           75 bpm. Exam Location:  [facility] Procedure: 2D Echo, Cardiac Doppler and Color Doppler Indications:    Stroke 434.91 / I163.9  History:        Patient has no prior history of Echocardiogram examinations.                 Stroke; Risk Factors:Dyslipidemia and Diabetes.  Sonographer:    Alvino Chapel RCS Referring Phys: [place]  1. Left ventricular ejection fraction, by estimation, is 60 to 65%. The left ventricle has normal function. The left ventricle has no regional wall motion abnormalities. Left ventricular diastolic parameters were normal.  2. Right ventricular systolic function is normal. The right ventricular size is normal. Tricuspid regurgitation signal is inadequate for assessing PA pressure.  3. The mitral valve is grossly normal. Trivial mitral valve regurgitation. No evidence of mitral stenosis.  4. The aortic valve is tricuspid. Aortic valve regurgitation is not visualized. Mild aortic valve sclerosis is present, with no evidence of aortic valve stenosis.  5. The inferior vena cava is normal in size with greater than 50% respiratory variability, suggesting right atrial pressure of 3 mmHg. Comparison(s): No prior Echocardiogram. Conclusion(s)/Recommendation(s): No intracardiac source of embolism seen. FINDINGS  Left Ventricle: Left ventricular ejection fraction, by estimation, is 60 to 65%. The left ventricle has normal function. The left ventricle has no regional wall motion abnormalities. Definity contrast agent was given IV to delineate the left ventricular  endocardial borders. The left ventricular internal cavity size was normal in size. There is no left ventricular hypertrophy. Left ventricular diastolic parameters were normal. Right Ventricle: The right  ventricular size is normal. No increase in right ventricular wall thickness. Right ventricular systolic function is normal. Tricuspid regurgitation signal is inadequate for assessing PA pressure. Left Atrium: Left atrial size was normal in size. Right  Atrium: Right atrial size was normal in size. Pericardium: There is no evidence of pericardial effusion. Mitral Valve: The mitral valve is grossly normal. Trivial mitral valve regurgitation. No evidence of mitral valve stenosis. Tricuspid Valve: The tricuspid valve is normal in structure. Tricuspid valve regurgitation is trivial. No evidence of tricuspid stenosis. Aortic Valve: The aortic valve is tricuspid. Aortic valve regurgitation is not visualized. Mild aortic valve sclerosis is present, with no evidence of aortic valve stenosis. Mild aortic valve annular calcification. There is mild calcification of the aortic valve. Pulmonic Valve: The pulmonic valve was not well visualized. Pulmonic valve regurgitation is not visualized. No evidence of pulmonic stenosis. Aorta: The aortic root is normal in size and structure, the aortic arch was not well visualized and the ascending aorta was not well visualized. Venous: The inferior vena cava is normal in size with greater than 50% respiratory variability, suggesting right atrial pressure of 3 mmHg. IAS/Shunts: The atrial septum is grossly normal.  LEFT VENTRICLE PLAX 2D LVIDd:         3.59 cm  Diastology LVIDs:         2.42 cm  LV e' lateral:   9.46 cm/s LV PW:         0.97 cm  LV E/e' lateral: 6.0 LV IVS:        0.98 cm  LV e' medial:    4.24 cm/s LVOT diam:     1.90 cm  LV E/e' medial:  13.3 LV SV:         90 LV SV Index:   54 LVOT Area:     2.84 cm  RIGHT VENTRICLE RV S prime:     12.20 cm/s TAPSE (M-mode): 1.8 cm LEFT ATRIUM             Index LA diam:        3.40 cm 2.03 cm/m LA Vol (A2C):   32.3 ml 19.30 ml/m LA Vol (A4C):   42.1 ml 25.15 ml/m LA Biplane Vol: 36.9 ml 22.05 ml/m  AORTIC VALVE LVOT Vmax:   150.00 cm/s  LVOT Vmean:  119.000 cm/s LVOT VTI:    0.317 m  AORTA Ao Root diam: 3.20 cm MITRAL VALVE MV Area (PHT): 1.29 cm    SHUNTS MV Decel Time: 588 msec    Systemic VTI:  0.32 m MV E velocity: 56.60 cm/s  Systemic Diam: 1.90 cm MV A velocity: 97.00 cm/s MV E/A ratio:  0.58 Buford Dresser MD Electronically signed by Buford Dresser MD Signature Date/Time: 10/15/2019/1:32:37 PM    Final    CT HEAD CODE STROKE WO CONTRAST  Result Date: 10/14/2019 CLINICAL DATA:  Code stroke.  Right facial droop and slurred speech. EXAM: CT HEAD WITHOUT CONTRAST TECHNIQUE: Contiguous axial images were obtained from the base of the skull through the vertex without intravenous contrast. COMPARISON:  04/25/2018 FINDINGS: Brain: There is no evidence of an acute infarct, intracranial hemorrhage, mass, midline shift, or extra-axial fluid collection. Mild cerebral atrophy is unchanged. Hypodensities in the cerebral white matter bilaterally are also unchanged and nonspecific but compatible with mild chronic small vessel ischemic disease. Vascular: Calcified atherosclerosis at the skull base. No hyperdense vessel. Skull: No fracture or suspicious osseous lesion. Sinuses/Orbits: Clear paranasal sinuses and right mastoid air cells. Unchanged large left mastoid effusion. Bilateral cataract extraction. Other: None. ASPECTS [facility] Stroke Program Early CT Score) - Ganglionic level infarction (caudate, lentiform nuclei, internal capsule, insula, M1-M3 cortex): 7 - Supraganglionic infarction (M4-M6 cortex): 3 Total score (0-10 with  10 being normal): 10 IMPRESSION: 1. No evidence of acute intracranial abnormality. 2. ASPECTS is 10. 3. Mild chronic small vessel ischemic disease. 4. Unchanged large left mastoid effusion. These results were called by telephone at the time of interpretation on 10/14/2019 at 6:36 pm to Dr. Fredia Sorrow, who verbally acknowledged these results. Electronically Signed   By: Logan Bores M.D.   On: 10/14/2019 18:37    CT ANGIO HEAD CODE STROKE  Result Date: 10/14/2019 CLINICAL DATA:  Right facial droop and slurred speech. EXAM: CT ANGIOGRAPHY HEAD AND NECK CT PERFUSION BRAIN TECHNIQUE: Multidetector CT imaging of the head and neck was performed using the standard protocol during bolus administration of intravenous contrast. Multiplanar CT image reconstructions and MIPs were obtained to evaluate the vascular anatomy. Carotid stenosis measurements (when applicable) are obtained utilizing NASCET criteria, using the distal internal carotid diameter as the denominator. Multiphase CT imaging of the brain was performed following IV bolus contrast injection. Subsequent parametric perfusion maps were calculated using RAPID software. CONTRAST:  117mL OMNIPAQUE IOHEXOL 350 MG/ML SOLN COMPARISON:  None. FINDINGS: CTA NECK FINDINGS Aortic arch: Normal variant aortic arch branching pattern with common origin of the brachiocephalic and left common carotid arteries. Mild aortic arch atherosclerosis without significant arch vessel origin stenosis. Right carotid system: Patent with minimal nonstenotic calcified plaque at the carotid bifurcation. Mildly ectatic appearance of the distal cervical ICA. Left carotid system: Patent with mild nonstenotic predominantly calcified plaque in the proximal ICA. Mildly ectatic appearance of the distal cervical ICA. Vertebral arteries: Patent without evidence of stenosis or dissection. Moderately dominant left vertebral artery. Skeleton: Moderately advanced cervical facet arthrosis with grade 1 anterolisthesis of C6 on C7 and C5 on C6. Left facet ankylosis at C5-6. Mild cervical disc degeneration. Other neck: No evidence of cervical lymphadenopathy or mass. Upper chest: Minimal dependent atelectasis in the superior segments of the lower lobes. Review of the MIP images confirms the above findings CTA HEAD FINDINGS Anterior circulation: The internal carotid arteries are patent from skull base to carotid  termini with calcified plaque resulting in up to mild supraclinoid stenosis bilaterally. ACAs and MCAs are patent without evidence of a proximal branch occlusion or significant proximal stenosis. No aneurysm is identified. Posterior circulation: The intracranial vertebral arteries are patent to the basilar. Left V4 segment plaque results in luminal irregularity and up to mild stenosis. Patent PICA and SCA origins are identified bilaterally. The basilar artery is patent and mildly irregular without significant stenosis. Posterior communicating arteries are diminutive or absent. The PCAs are patent with severe stenoses noted at the left P1-P2 junction and in the proximal right P3 segment. No aneurysm is identified. Venous sinuses: Patent. Anatomic variants: None. Review of the MIP images confirms the above findings CT Brain Perfusion Findings: ASPECTS: 10 CBF (<30%) Volume: 0 mL Perfusion (Tmax>6.0s) volume: 57mL, located along the floor of the left anterior cranial fossa and considered artifactual Mismatch Volume: n/a Infarction Location: None evident by CTP IMPRESSION: 1. No emergent large vessel occlusion. 2. Intracranial atherosclerosis with severe bilateral PCA stenoses. No significant proximal anterior circulation stenosis. 3. Widely patent cervical carotid and vertebral arteries. 4. Negative CTP. 5. Aortic Atherosclerosis (ICD10-I70.0). Electronically Signed   By: Logan Bores M.D.   On: 10/14/2019 19:37   CT ANGIO NECK CODE STROKE  Result Date: 10/14/2019 CLINICAL DATA:  Right facial droop and slurred speech. EXAM: CT ANGIOGRAPHY HEAD AND NECK CT PERFUSION BRAIN TECHNIQUE: Multidetector CT imaging of the head and neck was performed using the standard  protocol during bolus administration of intravenous contrast. Multiplanar CT image reconstructions and MIPs were obtained to evaluate the vascular anatomy. Carotid stenosis measurements (when applicable) are obtained utilizing NASCET criteria, using the distal  internal carotid diameter as the denominator. Multiphase CT imaging of the brain was performed following IV bolus contrast injection. Subsequent parametric perfusion maps were calculated using RAPID software. CONTRAST:  147mL OMNIPAQUE IOHEXOL 350 MG/ML SOLN COMPARISON:  None. FINDINGS: CTA NECK FINDINGS Aortic arch: Normal variant aortic arch branching pattern with common origin of the brachiocephalic and left common carotid arteries. Mild aortic arch atherosclerosis without significant arch vessel origin stenosis. Right carotid system: Patent with minimal nonstenotic calcified plaque at the carotid bifurcation. Mildly ectatic appearance of the distal cervical ICA. Left carotid system: Patent with mild nonstenotic predominantly calcified plaque in the proximal ICA. Mildly ectatic appearance of the distal cervical ICA. Vertebral arteries: Patent without evidence of stenosis or dissection. Moderately dominant left vertebral artery. Skeleton: Moderately advanced cervical facet arthrosis with grade 1 anterolisthesis of C6 on C7 and C5 on C6. Left facet ankylosis at C5-6. Mild cervical disc degeneration. Other neck: No evidence of cervical lymphadenopathy or mass. Upper chest: Minimal dependent atelectasis in the superior segments of the lower lobes. Review of the MIP images confirms the above findings CTA HEAD FINDINGS Anterior circulation: The internal carotid arteries are patent from skull base to carotid termini with calcified plaque resulting in up to mild supraclinoid stenosis bilaterally. ACAs and MCAs are patent without evidence of a proximal branch occlusion or significant proximal stenosis. No aneurysm is identified. Posterior circulation: The intracranial vertebral arteries are patent to the basilar. Left V4 segment plaque results in luminal irregularity and up to mild stenosis. Patent PICA and SCA origins are identified bilaterally. The basilar artery is patent and mildly irregular without significant  stenosis. Posterior communicating arteries are diminutive or absent. The PCAs are patent with severe stenoses noted at the left P1-P2 junction and in the proximal right P3 segment. No aneurysm is identified. Venous sinuses: Patent. Anatomic variants: None. Review of the MIP images confirms the above findings CT Brain Perfusion Findings: ASPECTS: 10 CBF (<30%) Volume: 0 mL Perfusion (Tmax>6.0s) volume: 91mL, located along the floor of the left anterior cranial fossa and considered artifactual Mismatch Volume: n/a Infarction Location: None evident by CTP IMPRESSION: 1. No emergent large vessel occlusion. 2. Intracranial atherosclerosis with severe bilateral PCA stenoses. No significant proximal anterior circulation stenosis. 3. Widely patent cervical carotid and vertebral arteries. 4. Negative CTP. 5. Aortic Atherosclerosis (ICD10-I70.0). Electronically Signed   By: Logan Bores M.D.   On: 10/14/2019 19:37       [place] [place]   [facility] If 7PM-7AM, please contact night-coverage at [email or web address], Office  [phone]   10/16/2019, 12:12 PM  LOS: 2 days

## 2019-10-16 NOTE — Consult Note (Signed)
Referring Physician: Dr Darrick Meigs    Reason for Consult: Stroke  HPI: History obtained from granddaughter at bedside  Kathleen [name] is an 86 y.o. female with DM, HLD, HTN, hypothyroidism, and chronic pancreatitis who was initially seen at [facility] on 10/14/19 as a Code Stroke for right hemiparesis and dysarthria. Per granddaughter, she was with pt at 5pm Friday, pt was at her baseline. At 5:15pm she went back to check on pt and pt was slump to the right side in the recliner with slurry speech. Granddaughter called EMS and pt arrived to [facility] around 6pm. A Tele-Neurology consult was obtained from Dr Maryan Rued, however, he tried to call granddaughter to obtain LSW but not able to reach granddaughter. However, granddaughter stated that she did not received any phonecall at that time. Due to unclear time onset, pt was not offered tPA. CT no acute finding and CTA head and neck no LVO. Pt was admitted for stroke workup and was treated with aspirin. MRI showed left large pontine infarct. Pt transfer to [facility] for further management.   Per granddaughter, pt takes ASA at home and takes synthroid for hypothyroidism. She also takes metformin for the DM. She takes 5 meds for her allergy.   Time last known well: Friday 5pm tPA Given: no - Last known well was not able to be obtained at the time of code stroke.  Past Medical History Past Medical History:  Diagnosis Date  . Arthritis   . Diabetes mellitus without complication ([place])    "borderline"  . Diverticulosis of colon   . High cholesterol   . Hypertension   . Hypothyroidism   . Pancreatitis     Surgical History Past Surgical History:  Procedure Laterality Date  . ABDOMINAL HYSTERECTOMY     partial  . APPENDECTOMY    . bladder tac    . CHOLECYSTECTOMY  1999   cholelithiasis  . COLONOSCOPY  01/2007   Dr Olevia Perches: normal  . COLONOSCOPY  12/2000   Dr Deatra Ina- diverticulosis  . ESOPHAGOGASTRODUODENOSCOPY     Dr Damaris Hippo cannot remember  . EUS N/A 03/24/2012   Dr.  Paulita Fujita: suspected passed CBD stone as cause of acute pancreatitis, suspective chronic pancreatitis.   Marland Kitchen EXCISION OF BREAST BIOPSY    . HIP PINNING,CANNULATED Left 01/15/2015   Procedure: CANNULATED HIP PINNING/LEFT;  Surgeon: Renette Butters, MD;  Location: [facility];  Service: Orthopedics;  Laterality: Left;  Marland Kitchen VEIN SURGERY      Family History  Family History  Problem Relation Age of Onset  . Diabetes Father   . Diabetes Mother   . Cancer Mother        ovarian  . Cancer Sister 64       breast  . Cancer Sister 65       bone    Social History:   reports that she has never smoked. She has never used smokeless tobacco. She reports that she does not drink alcohol and does not use drugs.  Allergies:  Allergies  Allergen Reactions  . Alendronate     Unknown reaction  . Cephalexin Other (See Comments)    Unknown reaction Unknown reaction Unknown reaction  . Clindamycin/Lincomycin Hives  . Creon [Pancreatin] Hives  . Doxycycline Other (See Comments)    Unknown reaction Unknown reaction  . Florastor [Yeast] Hives  . Penicillins Other (See Comments)    Has patient had a PCN reaction causing immediate rash, facial/tongue/throat swelling, SOB or lightheadedness with hypotension: unknown Has patient had a  PCN reaction causing severe rash involving mucus membranes or skin necrosis: unknown Has patient had a PCN reaction that required hospitalization unknown Has patient had a PCN reaction occurring within the last 10 years:unknown If all of the above answers are "NO", then may proceed with Cephalosporin use.  Marland Kitchen Zenpep [Pancrelipase (Lip-Prot-Amyl)] Hives    Home Medications:  Medications Prior to Admission  Medication Sig Dispense Refill  . amLODipine (NORVASC) 5 MG tablet Take 5 mg by mouth every morning.     Marland Kitchen aspirin EC 81 MG tablet Take 81 mg by mouth at bedtime.     . carboxymethylcellulose (REFRESH PLUS) 0.5 % SOLN Place 1 drop into both eyes 3 (three) times daily as needed  (for dry eyes).     . famotidine (PEPCID) 40 MG tablet Take 40 mg by mouth 2 (two) times daily.    . fexofenadine (ALLEGRA) 180 MG tablet Take 180 mg by mouth daily.    . hydrOXYzine (ATARAX/VISTARIL) 25 MG tablet Take 25-50 mg by mouth every 8 (eight) hours as needed.    Marland Kitchen levothyroxine (SYNTHROID) 88 MCG tablet Take 1 tablet by mouth daily.    Marland Kitchen lisinopril (PRINIVIL,ZESTRIL) 2.5 MG tablet Take 2.5 mg by mouth daily.     . metFORMIN (GLUCOPHAGE-XR) 500 MG 24 hr tablet Take 500 mg by mouth daily with breakfast.     . montelukast (SINGULAIR) 10 MG tablet Take 10 mg by mouth daily.    . Multiple Vitamin (MULTIVITAMIN WITH MINERALS) TABS tablet Take 1 tablet by mouth daily.    . Omega-3 Fatty Acids (FISH OIL) 1000 MG CAPS Take 1 capsule by mouth daily.     . potassium chloride SA (K-DUR,KLOR-CON) 20 MEQ tablet Take 20 mEq by mouth daily.    Marland Kitchen levocetirizine (XYZAL) 5 MG tablet Take 5 mg by mouth every evening. (Patient not taking: Reported on 10/15/2019)      Hospital Medications .  stroke: mapping our early stages of recovery book   Does not apply Once  . aspirin  300 mg Rectal Daily   Or  . aspirin  325 mg Oral Daily  . dorzolamide  1 drop Both Eyes BID  . heparin  5,000 Units Subcutaneous Q8H  . insulin aspart  0-9 Units Subcutaneous TID WC  . latanoprost  1 drop Both Eyes QHS  . levothyroxine  50 mcg Intravenous Daily  . pantoprazole (PROTONIX) IV  40 mg Intravenous Q24H  . timolol  1 drop Both Eyes BID    Review of Systems: ROS was attempted today and was able to be performed.  Systems assessed include - Constitutional, Eyes, HENT, Respiratory, Cardiovascular, Gastrointestinal, Genitourinary, Integument/breast, Hematologic/lymphatic, Musculoskeletal, Neurological, Behavioral/Psych, Endocrine, Allergic/Immunologic - with pertinent responses as per HPI.   LABORATORY STUDIES:  Basic Metabolic Panel: Recent Labs  Lab 10/14/19 1827 10/14/19 1853  NA 134* 132*  K 4.6 3.7  CL 101 100   CO2  --  20*  GLUCOSE 158* 160*  BUN 15 14  CREATININE 0.60 0.59  CALCIUM  --  9.1  MG  --  1.9    Liver Function Tests: Recent Labs  Lab 10/14/19 1853  AST 24  ALT 16  ALKPHOS 74  BILITOT 0.6  PROT 7.1  ALBUMIN 4.1   No results for input(s): LIPASE, AMYLASE in the last 168 hours. No results for input(s): AMMONIA in the last 168 hours.  CBC: Recent Labs  Lab 10/14/19 1823 10/14/19 1827  WBC 8.6  --   NEUTROABS 6.3  --  HGB 13.8 13.9  HCT 40.5 41.0  MCV 90.8  --   PLT 252  --     Cardiac Enzymes: No results for input(s): CKTOTAL, CKMB, CKMBINDEX, TROPONINI in the last 168 hours.  BNP: Invalid input(s): POCBNP  CBG: Recent Labs  Lab 10/15/19 2112 10/15/19 2305 10/16/19 0309 10/16/19 0745 10/16/19 1147  GLUCAP 110* 100* 123* 116* 116*    Microbiology:   Coagulation Studies: Recent Labs    10/14/19 1823  LABPROT 12.9  INR 1.0    Urinalysis:  Recent Labs  Lab 10/14/19 1818  COLORURINE STRAW*  LABSPEC 1.034*  PHURINE 7.0  GLUCOSEU NEGATIVE  HGBUR SMALL*  BILIRUBINUR NEGATIVE  KETONESUR NEGATIVE  PROTEINUR NEGATIVE  NITRITE NEGATIVE  LEUKOCYTESUR MODERATE*    Lipid Panel:     Component Value Date/Time   CHOL 203 (H) 10/15/2019 0428   TRIG 66 10/15/2019 0428   HDL 56 10/15/2019 0428   CHOLHDL 3.6 10/15/2019 0428   VLDL 13 10/15/2019 0428   LDLCALC 134 (H) 10/15/2019 0428    HgbA1C:  Lab Results  Component Value Date   HGBA1C 5.8 (H) 10/15/2019    Urine Drug Screen:      Component Value Date/Time   LABOPIA NONE DETECTED 10/14/2019 1818   COCAINSCRNUR NONE DETECTED 10/14/2019 1818   LABBENZ NONE DETECTED 10/14/2019 1818   AMPHETMU NONE DETECTED 10/14/2019 1818   THCU NONE DETECTED 10/14/2019 1818   LABBARB NONE DETECTED 10/14/2019 1818     Alcohol Level:  Recent Labs  Lab 10/14/19 1853  ETH <10    Miscellaneous labs:  ECG - SR rate   BPM. (See cardiology reading for complete details)   Physical exam Temp:   [97.8 F (36.6 C)-99.3 F (37.4 C)] 99.3 F (37.4 C) (09/05 0744) Pulse Rate:  [68-89] 89 (09/05 0744) Resp:  [15-23] 18 (09/05 1400) BP: (105-171)/(51-84) 129/57 (09/05 0744) SpO2:  [94 %-100 %] 99 % (09/05 0744) Weight:  [65 kg] 65 kg (09/04 2325)  General - Well nourished, well developed, in no apparent distress.  Ophthalmologic - fundi not visualized due to noncooperation.  Cardiovascular - Regular rhythm and rate.  Mental Status -  Level of arousal and orientation to month, place, and person were intact. Not orientated to year Language including expression, naming, repetition, comprehension was assessed and found intact. However, severe dysarthria  Cranial Nerves II - XII - II - Visual field intact OU. III, IV, VI - Extraocular movements intact. V - Facial sensation intact bilaterally. VII - Right facial droop VIII - Hard of hearing & vestibular intact bilaterally. X - Palate elevates symmetrically. XI - Chin turning & shoulder shrug intact bilaterally. XII - Tongue protrusion intact.  Motor Strength - The patient's strength was normal in left upper and lower extremities however, RUE 0/5 and RLE mild withdraw with pain.  Bulk was normal and fasciculations were absent.   Motor Tone - Muscle tone was assessed at the neck and appendages and was normal.  Reflexes - The patient's reflexes were decreased on the right UE and LE, however mildly increased muscle tone on the right UE. she had no pathological reflexes.  Sensory - Light touch, temperature/pinprick were assessed and were symmetrical.    Coordination - The patient had normal movements in the left hand with no ataxia or dysmetria.  Tremor was absent.  Gait and Station - deferred.   IMAGING:  MR BRAIN WO CONTRAST 10/16/2019 IMPRESSION:  1. Acute infarct of the left pons. No hemorrhage or mass  effect.  2. Generalized atrophy and findings of chronic small vessel disease.   ECHOCARDIOGRAM  COMPLETE 10/15/2019 IMPRESSIONS   1. Left ventricular ejection fraction, by estimation, is 60 to 65%. The left ventricle has normal function. The left ventricle has no regional wall motion abnormalities. Left ventricular diastolic parameters were normal.   2. Right ventricular systolic function is normal. The right ventricular size is normal. Tricuspid regurgitation signal is inadequate for assessing PA pressure.   3. The mitral valve is grossly normal. Trivial mitral valve regurgitation. No evidence of mitral stenosis.   4. The aortic valve is tricuspid. Aortic valve regurgitation is not visualized. Mild aortic valve sclerosis is present, with no evidence of aortic valve stenosis.   5. The inferior vena cava is normal in size with greater than 50% respiratory variability, suggesting right atrial pressure of 3 mmHg. Comparison(s): No prior Echocardiogram. Conclusion(s)/Recommendation(s): No intracardiac source of embolism seen.  CT HEAD CODE STROKE WO CONTRAST 10/14/2019 IMPRESSION:  1. No evidence of acute intracranial abnormality.  2. ASPECTS is 10.  3. Mild chronic small vessel ischemic disease.  4. Unchanged large left mastoid effusion.  CT ANGIO HEAD CODE STROKE CT ANGIO NECK CODE STROKE CT CEREBRAL PERFUSION W CONTRAST 10/14/2019 IMPRESSION:  1. No emergent large vessel occlusion.  2. Intracranial atherosclerosis with severe bilateral PCA stenoses. No significant proximal anterior circulation stenosis.  3. Widely patent cervical carotid and vertebral arteries.  4. Negative CTP.  5. Aortic Atherosclerosis (ICD10-I70.0).   Assessment:  Ms. Kathleen [name] is a 86 y.o. female with history of DM, HLD, HTN, hypothyroidism, and chronic pancreatitis who was initially seen at [facility] on 10/14/19 as a Code Stroke after she presented with right hemiparesis and dysarthria. She did not receive IV t-PA as the last known well could not be obtained. A CTA H&N did not show a LVO. A CTP was negative. The pt.  was eventually tx'd to [facility] for further evaluation and treatment of an acute infarct of the left pons.   Stroke:  acute infarct of the left pons likely due to small vessel disease.  Code Stroke CT Head -  No evidence of acute intracranial abnormality. Unchanged large left mastoid effusion.  CTA H&N - No emergent large vessel occlusion. Intracranial atherosclerosis with severe bilateral PCA stenoses.   CT Perfusion - negative  MRI head -  Acute infarct of the left pons. No hemorrhage or mass effect.   2D Echo - EF 60 - 65%. No cardiac source of emboli identified.   Sars Corona Virus 2 - negative  LDL - 134  HgbA1c - 5.8  UDS - negative  VTE prophylaxis - [place] Heparin   aspirin 81 mg daily prior to admission, now on ASA 81 and plavix 75 DAPT for 3 weeks and then plavix alone.   Patient counseled to be compliant with her antithrombotic medications  Ongoing aggressive stroke risk factor management  Therapy recommendations:  pending  Disposition:  Pending  Hypertension  Home BP meds: Norvasc and lisinopril  Current BP meds: none   Stable . Long-term BP goal normotensive  Hyperlipidemia  Home Lipid lowering medication: none   LDL 134, goal < 70  Current lipid lowering medication: lipitor 40  Continue statin at discharge  Diabetes, controlled  Home diabetic meds: metformin  Current diabetic meds: SSI  HgbA1c 5.8, goal < 7.0  CBG monitoring  PCP follow up   Dysphagia  Was NPO due ot severe dysarthria  Now passed swallow  On dys1 and  thin liquid  Avoid aspiration  Speech following  Other Stroke Risk Factors  Advanced age  Other Active Problems  Code status - DNR  Hearing impaired  Thank you for this consultation and allowing [place] to participate in the care of this patient.   Rosalin Hawking, MD PhD Stroke Neurology 10/16/2019 2:59 PM

## 2019-10-16 NOTE — Progress Notes (Signed)
Patient was taken and returned from MRI via bed.

## 2019-10-16 NOTE — Progress Notes (Signed)
Attempted to feed pt.  She did okay with magic cup, apple sauce, and drinks; however, coughed a lot w/mashed potatoes and pureed meat (even with very small bites).  She is also unable to cough enough to clear the food effectively and so we elected to stop feeding her.

## 2019-10-17 LAB — GLUCOSE, CAPILLARY
Glucose-Capillary: 102 mg/dL — ABNORMAL HIGH (ref 70–99)
Glucose-Capillary: 95 mg/dL (ref 70–99)
Glucose-Capillary: 142 mg/dL — ABNORMAL HIGH (ref 70–99)
Glucose-Capillary: 102 mg/dL — ABNORMAL HIGH (ref 70–99)
Glucose-Capillary: 103 mg/dL — ABNORMAL HIGH (ref 70–99)
Glucose-Capillary: 179 mg/dL — ABNORMAL HIGH (ref 70–99)

## 2019-10-17 LAB — COMPREHENSIVE METABOLIC PANEL
ALT: 17 U/L (ref 0–44)
AST: 22 U/L (ref 15–41)
Albumin: 2.7 g/dL — ABNORMAL LOW (ref 3.5–5.0)
Alkaline Phosphatase: 63 U/L (ref 38–126)
Anion gap: 11 (ref 5–15)
BUN: 7 mg/dL — ABNORMAL LOW (ref 8–23)
CO2: 20 mmol/L — ABNORMAL LOW (ref 22–32)
Calcium: 8 mg/dL — ABNORMAL LOW (ref 8.9–10.3)
Chloride: 106 mmol/L (ref 98–111)
Creatinine, Ser: 0.56 mg/dL (ref 0.44–1.00)
GFR calc Af Amer: >60 mL/min (ref >60)
GFR calc non Af Amer: >60 mL/min (ref >60)
Glucose, Bld: 98 mg/dL (ref 70–99)
Potassium: 3 mmol/L — ABNORMAL LOW (ref 3.5–5.1)
Sodium: 137 mmol/L (ref 135–145)
Total Bilirubin: 1 mg/dL (ref 0.3–1.2)
Total Protein: 5.5 g/dL — ABNORMAL LOW (ref 6.5–8.1)

## 2019-10-17 LAB — CBC
HCT: 36.9 % (ref 36.0–46.0)
Hemoglobin: 11.9 g/dL — ABNORMAL LOW (ref 12.0–15.0)
MCH: 30.1 pg (ref 26.0–34.0)
MCHC: 32.2 g/dL (ref 30.0–36.0)
MCV: 93.4 fL (ref 80.0–100.0)
Platelets: 188 10*3/uL (ref 150–400)
RBC: 3.95 MIL/uL (ref 3.87–5.11)
RDW: 14.6 % (ref 11.5–15.5)
WBC: 8.2 10*3/uL (ref 4.0–10.5)
nRBC: 0 % (ref 0.0–0.2)

## 2019-10-17 MED ORDER — POTASSIUM CHLORIDE 10 MEQ/100ML IV SOLN
10.0000 meq | INTRAVENOUS | Status: AC
  Administered 2019-10-17 (×3): 10 meq via INTRAVENOUS
  Filled 2019-10-17 (×3): qty 100

## 2019-10-17 NOTE — TOC Initial Note (Signed)
Transition of Care [facility]) - Initial/Assessment Note    Patient Details  Name: Kathleen [name] MRN: [id number] Date of Birth: [birth year]  Transition of Care [facility]) CM/SW Contact:    Joanne Chars, LCSW Phone Number: 10/17/2019, 9:36 AM  Clinical Narrative:   Per RN, pt unable to participate in assessment, CSW spoke with Granddaughter, Lattie Haw.  Pt lives alone but several extended family live next door, good support.  Pt aware of and in agreement with plan for SNF.  Pt is vaccinated.                 Expected Discharge Plan: Skilled Nursing Facility Barriers to Discharge: Continued Medical Work up, SNF Pending bed offer   Patient Goals and CMS Choice Patient states their goals for this hospitalization and ongoing recovery are:: get back home      Expected Discharge Plan and Services Expected Discharge Plan: [place] Choice: [place] Living arrangements for the past 2 months: Single Family Home                                      Prior Living Arrangements/Services Living arrangements for the past 2 months: Single Family Home Lives with:: Self Patient language and need for interpreter reviewed:: Yes Do you feel safe going back to the place where you live?: Yes      Need for Family Participation in Patient Care: Yes (Comment) Care giver support system in place?: Yes (comment) (family lives next door)   Criminal Activity/Legal Involvement Pertinent to Current Situation/Hospitalization: No - Comment as needed  Activities of Daily Living Home Assistive Devices/Equipment: CBG Meter, Dentures (specify type), Eyeglasses, Hearing aid, Walker (specify type) ADL Screening (condition at time of admission) Patient's cognitive ability adequate to safely complete daily activities?: Yes Is the patient deaf or have difficulty hearing?: Yes Does the patient have difficulty seeing, even when wearing glasses/contacts?: Yes Does the  patient have difficulty concentrating, remembering, or making decisions?: No Patient able to express need for assistance with ADLs?: No Does the patient have difficulty dressing or bathing?: Yes Independently performs ADLs?: No Communication: Needs assistance Is this a change from baseline?: Change from baseline, expected to last >3 days Dressing (OT): Needs assistance Is this a change from baseline?: Change from baseline, expected to last >3 days Grooming: Needs assistance Is this a change from baseline?: Change from baseline, expected to last >3 days Feeding: Needs assistance Is this a change from baseline?: Change from baseline, expected to last >3 days Bathing: Needs assistance Is this a change from baseline?: Change from baseline, expected to last >3 days Toileting: Needs assistance Is this a change from baseline?: Change from baseline, expected to last >3days In/Out Bed: Needs assistance Is this a change from baseline?: Change from baseline, expected to last >3 days Walks in Home: Needs assistance Is this a change from baseline?: Change from baseline, expected to last >3 days Does the patient have difficulty walking or climbing stairs?: Yes Weakness of Legs: Both Weakness of Arms/Hands: Right  Permission Sought/Granted Permission sought to share information with :  (information from granddaughter)                Emotional Assessment Appearance::  (initial assessment done remotely) Attitude/Demeanor/Rapport:  (spoke with granddaughter)   Orientation: : Oriented to Self Alcohol / Substance Use: Not Applicable Psych Involvement: No (comment)  Admission diagnosis:  CVA (cerebrovascular accident due to intracerebral hemorrhage) ([place]) [I61.9] Cerebrovascular accident (CVA), unspecified mechanism ([place]) [I63.9] Patient Active Problem List   Diagnosis Date Noted  . CVA (cerebrovascular accident due to intracerebral hemorrhage) ([place]) 10/14/2019  . Leg swelling 05/07/2018  .  Chronic non-seasonal allergic rhinitis 04/28/2018  . GERD without esophagitis 04/28/2018  . Hyperlipidemia associated with type 2 diabetes mellitus ([place]) 04/28/2018  . Chronic open angle glaucoma 04/28/2018  . Constipation in female 04/28/2018  . Hyponatremia 04/25/2018  . Hallucinations 04/25/2018  . Acute bronchitis 04/24/2018  . CD (contact dermatitis) 04/24/2018  . Dermatitis, eczematoid 04/24/2018  . Allergic reaction 05/22/2016  . Fracture   . Controlled type 2 diabetes mellitus without complication, without long-term current use of insulin ([place]) 01/16/2015  . Hip fracture, left ([place]) 01/15/2015  . Varicose veins of leg with complications 07/68/0881  . Exocrine pancreatic insufficiency ([place]) 11/03/2013  . Chronic pancreatitis ([place]) 11/03/2013  . Pancreatitis 01/27/2012  . Hypokalemia 01/27/2012  . Hypertension associated with type 2 diabetes mellitus ([place])   . Hypothyroidism    PCP:  Aletha Halim., PA-C Pharmacy:   [id number] ([place]) [place], [place] [address] Phone: [phone] Fax: [phone]  [id number] - [place], [place] - [address] [place] [address] Phone: [phone] Fax: [place], [place] - [address] [place] [address] [place] [address] [place] [address] [place] [address] [place] [address] Phone: [phone] Fax: [phone]     Social Determinants of Health (SDOH) Interventions    Readmission Risk Interventions No flowsheet data found.

## 2019-10-17 NOTE — NC FL2 (Signed)
[place] LEVEL OF CARE SCREENING TOOL     IDENTIFICATION  Patient Name: Kathleen [name] Birthdate: May 29, 1931 Sex: female Admission Date (Current Location): 10/14/2019  [facility] [place] Number:  Herbalist and Address:  [facility], [place] [address] [place], [place] [address]      Provider Number: [id number]  Attending Physician Name and Address:  Oswald Hillock, MD  Relative Name and Phone Number:  Kathyrn Lass, Hauser    Current Level of Care: Hospital Recommended Level of Care: [place] Prior Approval Number:    Date Approved/Denied:   PASRR Number: [id number]  Discharge Plan: SNF    Current Diagnoses: Patient Active Problem List   Diagnosis Date Noted  . CVA (cerebrovascular accident due to intracerebral hemorrhage) ([place]) 10/14/2019  . Leg swelling 05/07/2018  . Chronic non-seasonal allergic rhinitis 04/28/2018  . GERD without esophagitis 04/28/2018  . Hyperlipidemia associated with type 2 diabetes mellitus ([place]) 04/28/2018  . Chronic open angle glaucoma 04/28/2018  . Constipation in female 04/28/2018  . Hyponatremia 04/25/2018  . Hallucinations 04/25/2018  . Acute bronchitis 04/24/2018  . CD (contact dermatitis) 04/24/2018  . Dermatitis, eczematoid 04/24/2018  . Allergic reaction 05/22/2016  . Fracture   . Controlled type 2 diabetes mellitus without complication, without long-term current use of insulin ([place]) 01/16/2015  . Hip fracture, left ([place]) 01/15/2015  . Varicose veins of leg with complications 62/70/3500  . Exocrine pancreatic insufficiency ([place]) 11/03/2013  . Chronic pancreatitis ([place]) 11/03/2013  . Pancreatitis 01/27/2012  . Hypokalemia 01/27/2012  . Hypertension associated with type 2 diabetes mellitus ([place])   . Hypothyroidism     Orientation RESPIRATION BLADDER Height & Weight     Self  O2 Incontinent Weight: 143 lb 4.8 oz (65 kg) Height:  5\' 2"  (157.5 cm)   BEHAVIORAL SYMPTOMS/MOOD NEUROLOGICAL BOWEL NUTRITION STATUS      Incontinent Diet (DYS.  See discharge summary)  AMBULATORY STATUS COMMUNICATION OF NEEDS Skin   Total Care Verbally Other (Comment) (ecchymosis)                       Personal Care Assistance Level of Assistance  Bathing, Feeding, Dressing Bathing Assistance: Maximum assistance Feeding assistance: Maximum assistance Dressing Assistance: Maximum assistance     Functional Limitations Info  Sight, Hearing, Speech Sight Info: Adequate Hearing Info: Adequate Speech Info: Impaired    SPECIAL CARE FACTORS FREQUENCY  PT (By licensed PT), OT (By licensed OT), Speech therapy     PT Frequency: 5x week OT Frequency: 5x week     Speech Therapy Frequency: 3x week      Contractures Contractures Info: Not present    Additional Factors Info  Code Status, Allergies, Insulin Sliding Scale Code Status Info: DNR Allergies Info: Alendronate, Cephalexin, Clindamycin/lincomycin, Creon Pancreatin, Doxycycline, Florastor Yeast, Penicillins, Zenpep Pancrelipase (Lip-prot-amyl)   Insulin Sliding Scale Info: 0-9 units, 3x day with meals       Current Medications (10/17/2019):  This is the current hospital active medication list Current Facility-Administered Medications  Medication Dose Route Frequency Provider Last Rate Last Admin  .  stroke: mapping our early stages of recovery book   Does not apply Once Elgergawy, Silver Huguenin, MD      . 0.9 %  sodium chloride infusion   Intravenous Continuous Elgergawy, Silver Huguenin, MD 75 mL/hr at 10/17/19 0022 New Bag at 10/17/19 0022  . acetaminophen (TYLENOL) tablet 650 mg  650 mg Oral Q4H  PRN Elgergawy, Silver Huguenin, MD       Or  . acetaminophen (TYLENOL) 160 MG/5ML solution 650 mg  650 mg Per Tube Q4H PRN Elgergawy, Silver Huguenin, MD       Or  . acetaminophen (TYLENOL) suppository 650 mg  650 mg Rectal Q4H PRN Elgergawy, Silver Huguenin, MD      . aspirin EC tablet 81 mg  81 mg Oral Daily Rosalin Hawking, MD       . atorvastatin (LIPITOR) tablet 40 mg  40 mg Oral Daily Rosalin Hawking, MD   40 mg at 10/16/19 1600  . clopidogrel (PLAVIX) tablet 75 mg  75 mg Oral Daily Rosalin Hawking, MD   75 mg at 10/16/19 1600  . dorzolamide (TRUSOPT) 2 % ophthalmic solution 1 drop  1 drop Both Eyes BID Elgergawy, Silver Huguenin, MD   1 drop at 10/16/19 2033  . heparin injection 5,000 Units  5,000 Units Subcutaneous Q8H Elgergawy, Silver Huguenin, MD   5,000 Units at 10/17/19 0532  . insulin aspart (novoLOG) injection 0-9 Units  0-9 Units Subcutaneous TID WC Lama, Marge Duncans, MD      . latanoprost (XALATAN) 0.005 % ophthalmic solution 1 drop  1 drop Both Eyes QHS Elgergawy, Silver Huguenin, MD   1 drop at 10/14/19 2344  . levothyroxine (SYNTHROID, LEVOTHROID) injection 50 mcg  50 mcg Intravenous Daily Elgergawy, Silver Huguenin, MD   50 mcg at 10/16/19 1055  . pantoprazole (PROTONIX) injection 40 mg  40 mg Intravenous Q24H Barton Dubois, MD   40 mg at 10/16/19 0829  . polyvinyl alcohol (LIQUIFILM TEARS) 1.4 % ophthalmic solution 1 drop  1 drop Both Eyes TID PRN Elgergawy, Silver Huguenin, MD      . timolol (TIMOPTIC) 0.5 % ophthalmic solution 1 drop  1 drop Both Eyes BID Elgergawy, Silver Huguenin, MD   1 drop at 10/16/19 2031     Discharge Medications: Please see discharge summary for a list of discharge medications.  Relevant Imaging Results:  Relevant Lab Results:   Additional Information SSN [id number]  Joanne Chars, LCSW

## 2019-10-17 NOTE — Progress Notes (Signed)
[facility]  PROGRESS NOTE  Kathleen [name] XHB:[id number] DOB: 24-Sep-1931 DOA: 10/14/2019 PCP: Aletha Halim., PA-C   Brief HPI:   86 year old female with past medical history of diabetes mellitus type 2, hypertension, GERD, hypothyroidism, chronic pancreatitis came to ED as a code stroke.  Patient did her self when she was seen normal around 5 PM.  Per granddaughter she complained of left knee pain and went to sit on the recliner she went back to check on her again at 5:15 PM patient noted to have slurred speech, right-sided weakness and EMS was called.  In the ED patient was not found to be a TPA candidate since she was out of window.  CTA head and neck showed no large vessel occlusion, intracranial atherosclerosis and severe bilateral PCA stenosis.  CT head showed no evidence of acute stroke. [facility] for further evaluation.   Subjective   Patient seen and examined, continues to have dysarthria.   Assessment/Plan:     1. Acute infarct left pons-confirmed on the MRI brain.  Continue aspirin 300 mg rectally daily, patient has failed swallow evaluation.  Echocardiogram done yesterday showed no source of embolus, EF is 60 to 65%.  CTA head and neck done on 10/14/2019 showed no emergent large vessel occlusion, intracranial atherosclerosis with severe bilateral PCA stenosis.  No significant proximal anterior circulation stenosis.  LDL is 134, goal is less than 70.  Continue atorvastatin, neurology recommends aspirin 81 mg and Plavix 75 mg for 3 weeks and then Plavix alone.  Await PT recommendations. 2. Hypothyroidism-patient is on IV levothyroxine.  TSH is 6.55.  TSH mildly elevated, likely sick euthyroid syndrome.  Will need repeat TSH in 6 to 8 weeks.  Continue same dose of Synthroid. 3. Diabetes mellitus type 2-start sliding scale insulin NovoLog.  Check CBG every 4 hours. 4. GERD without esophagitis-continue PPI 5. Hypertension-amlodipine  and Prinivil on hold for permissive hypertension 6. History of glaucoma-continue Xalatan, Cosopt 7. Hypokalemia-we will replace potassium and follow BMP in am.     COVID-19 Labs  No results for input(s): DDIMER, FERRITIN, LDH, CRP in the last 72 hours.  Lab Results  Component Value Date   [place] NEGATIVE 10/14/2019     Scheduled medications:     stroke: mapping our early stages of recovery book   Does not apply Once   aspirin EC  81 mg Oral Daily   atorvastatin  40 mg Oral Daily   clopidogrel  75 mg Oral Daily   dorzolamide  1 drop Both Eyes BID   heparin  5,000 Units Subcutaneous Q8H   insulin aspart  0-9 Units Subcutaneous TID WC   latanoprost  1 drop Both Eyes QHS   levothyroxine  50 mcg Intravenous Daily   pantoprazole (PROTONIX) IV  40 mg Intravenous Q24H   timolol  1 drop Both Eyes BID         CBG: Recent Labs  Lab 10/16/19 1919 10/16/19 2308 10/17/19 0308 10/17/19 0730 10/17/19 1155  GLUCAP 123* 166* 102* 95 142*    SpO2: 97 % O2 Flow Rate (L/min): 3 L/min    CBC: Recent Labs  Lab 10/14/19 1823 10/14/19 1827 10/17/19 0751  WBC 8.6  --  8.2  NEUTROABS 6.3  --   --   HGB 13.8 13.9 11.9*  HCT 40.5 41.0 36.9  MCV 90.8  --  93.4  PLT 252  --  810    Basic Metabolic Panel: Recent Labs  Lab 10/14/19 1827 10/14/19 1853 10/17/19 0751  NA 134* 132* 137  K 4.6 3.7 3.0*  CL 101 100 106  CO2  --  20* 20*  GLUCOSE 158* 160* 98  BUN 15 14 7*  CREATININE 0.60 0.59 0.56  CALCIUM  --  9.1 8.0*  MG  --  1.9  --      Liver Function Tests: Recent Labs  Lab 10/14/19 1853 10/17/19 0751  AST 24 22  ALT 16 17  ALKPHOS 74 63  BILITOT 0.6 1.0  PROT 7.1 5.5*  ALBUMIN 4.1 2.7*     Antibiotics: Anti-infectives (From admission, onward)   None       DVT prophylaxis: Heparin  Code Status: DNR  Family Communication: No family at bedside    Status is: Inpatient  Dispo: The patient is from: Home               Anticipated d/c is to: Skilled nursing facility              Anticipated d/c date is: 11/02/2019              Patient currently not medically stable for discharge  Barrier to discharge-stroke work-up underway, physical therapy evaluation pending          Consultants:  Neurology  Procedures:  Echocardiogram   Objective   Vitals:   10/16/19 1900 10/16/19 2028 10/17/19 0728 10/17/19 1300  BP:  139/61 (!) 139/54   Pulse:  73 62   Resp: 20 18  19   Temp:  97.9 F (36.6 C) 97.8 F (36.6 C)   TempSrc:  Oral    SpO2:  99% 97%   Weight:      Height:        Intake/Output Summary (Last 24 hours) at 10/17/2019 1454 Last data filed at 10/17/2019 1300 Gross per 24 hour  Intake 1957.18 ml  Output 325 ml  Net 1632.18 ml    09/04 1901 - 09/06 0700 In: 2352.5 [P.O.:150; I.V.:2202.5] Out: 725 [Urine:725]  Filed Weights   10/15/19 2325  Weight: 65 kg    Physical Examination:    General-appears in no acute distress  Heart-S1-S2, regular, no murmur auscultated  Lungs-clear to auscultation bilaterally, no wheezing or crackles auscultated  Abdomen-soft, nontender, no organomegaly  Extremities-no edema in the lower extremities  Neuro-alert, dysarthria, right hemiparesis    Data Reviewed:   Recent Results (from the past 240 hour(s))  SARS Coronavirus 2 by RT PCR (hospital order, performed in [facility] hospital lab) Nasopharyngeal Nasopharyngeal Swab     Status: None   Collection Time: 10/14/19  8:51 PM   Specimen: Nasopharyngeal Swab  Result Value Ref Range Status   SARS Coronavirus 2 NEGATIVE NEGATIVE Final    Comment: (NOTE) SARS-CoV-2 target nucleic acids are NOT DETECTED.  The SARS-CoV-2 RNA is generally detectable in upper and lower respiratory specimens during the acute phase of infection. The lowest concentration of SARS-CoV-2 viral copies this assay can detect is 250 copies / mL. A negative result does not preclude SARS-CoV-2 infection and should not  be used as the sole basis for treatment or other patient management decisions.  A negative result may occur with improper specimen collection / handling, submission of specimen other than nasopharyngeal swab, presence of viral mutation(s) within the areas targeted by this assay, and inadequate number of viral copies (<250 copies / mL). A negative result must be combined with clinical observations, patient history, and epidemiological information.  Fact Sheet for  Patients:   StrictlyIdeas.no  Fact Sheet for Healthcare Providers: BankingDealers.co.za  This test is not yet approved or  cleared by the [place] FDA and has been authorized for detection and/or diagnosis of SARS-CoV-2 by FDA under an Emergency Use Authorization (EUA).  This EUA will remain in effect (meaning this test can be used) for the duration of the COVID-19 declaration under Section 564(b)(1) of the Act, 21 U.S.C. section 360bbb-3(b)(1), unless the authorization is terminated or revoked sooner.  Performed at [facility], [address], [place], [place] [address]     No results for input(s): LIPASE, AMYLASE in the last 168 hours. No results for input(s): AMMONIA in the last 168 hours.  Cardiac Enzymes: No results for input(s): CKTOTAL, CKMB, CKMBINDEX, TROPONINI in the last 168 hours. BNP (last 3 results) No results for input(s): BNP in the last 8760 hours.  ProBNP (last 3 results) No results for input(s): PROBNP in the last 8760 hours.  Studies:  MR BRAIN WO CONTRAST  Result Date: 10/16/2019 CLINICAL DATA:  Right-sided weakness and speech changes EXAM: MRI HEAD WITHOUT CONTRAST TECHNIQUE: Multiplanar, multiecho pulse sequences of the brain and surrounding structures were obtained without intravenous contrast. COMPARISON:  None. FINDINGS: Brain: There is an acute infarct of the left pons. No other acute ischemia. No acute hemorrhage. Multifocal hyperintense  T2-weighted signal within the white matter. There is generalized atrophy without lobar predilection. Chronic microhemorrhage in the right thalamus. Old blood products in the right temporal lobe. Normal midline structures. Vascular: Normal flow voids. Skull and upper cervical spine: Normal marrow signal. Sinuses/Orbits: Left mastoid effusion. Bilateral ocular lens replacements. Other: None IMPRESSION: 1. Acute infarct of the left pons. No hemorrhage or mass effect. 2. Generalized atrophy and findings of chronic small vessel disease. Electronically Signed   By: Ulyses Jarred M.D.   On: 10/16/2019 01:24       [place]   [facility] If 7PM-7AM, please contact night-coverage at [email or web address], Office  [phone]   10/17/2019, 2:54 PM  LOS: 3 days

## 2019-10-17 NOTE — Progress Notes (Signed)
STROKE TEAM PROGRESS NOTE   INTERVAL HISTORY No family at bedside. Pt lying in bed, lethargic, no neuro change from yesterday, still has severe dysarthria and right hemiplegia. Also severe hearing loss bilaterally. PT/OT recommend SNF.   Vitals:   10/16/19 1557 10/16/19 1900 10/16/19 2028 10/17/19 0728  BP: (!) 135/54  139/61 (!) 139/54  Pulse: 72  73 62  Resp: 19 20 18    Temp: 99.4 F (37.4 C)  97.9 F (36.6 C) 97.8 F (36.6 C)  TempSrc:   Oral   SpO2: 100%  99% 97%  Weight:      Height:       CBC:  Recent Labs  Lab 10/14/19 1823 10/14/19 1823 10/14/19 1827 10/17/19 0751  WBC 8.6  --   --  8.2  NEUTROABS 6.3  --   --   --   HGB 13.8   < > 13.9 11.9*  HCT 40.5   < > 41.0 36.9  MCV 90.8  --   --  93.4  PLT 252  --   --  188   < > = values in this interval not displayed.   Basic Metabolic Panel:  Recent Labs  Lab 10/14/19 1853 10/17/19 0751  NA 132* 137  K 3.7 3.0*  CL 100 106  CO2 20* 20*  GLUCOSE 160* 98  BUN 14 7*  CREATININE 0.59 0.56  CALCIUM 9.1 8.0*  MG 1.9  --    Lipid Panel:  Recent Labs  Lab 10/15/19 0428  CHOL 203*  TRIG 66  HDL 56  CHOLHDL 3.6  VLDL 13  LDLCALC 134*   HgbA1c:  Recent Labs  Lab 10/15/19 0427  HGBA1C 5.8*   Urine Drug Screen:  Recent Labs  Lab 10/14/19 1818  LABOPIA NONE DETECTED  COCAINSCRNUR NONE DETECTED  LABBENZ NONE DETECTED  AMPHETMU NONE DETECTED  THCU NONE DETECTED  LABBARB NONE DETECTED    Alcohol Level  Recent Labs  Lab 10/14/19 1853  ETH <10    IMAGING past 24 hours No results found.  PHYSICAL EXAM  General - Well nourished, well developed, in no apparent distress.  Ophthalmologic - fundi not visualized due to noncooperation.  Cardiovascular - Regular rhythm and rate.  Mental Status -  Level of arousal and orientation to month, place, and person were intact. Not orientated to year Language including expression, naming, repetition, comprehension was assessed and found intact. However,  severe dysarthria  Cranial Nerves II - XII - II - Visual field intact OU. III, IV, VI - Extraocular movements intact. V - Facial sensation intact bilaterally. VII - Right facial droop VIII - Hard of hearing & vestibular intact bilaterally. X - Palate elevates symmetrically. XI - Chin turning & shoulder shrug intact bilaterally. XII - Tongue protrusion intact.  Motor Strength - The patient's strength was normal in left upper and lower extremities however, RUE 0/5 and RLE mild withdraw with pain.  Bulk was normal and fasciculations were absent.   Motor Tone - Muscle tone was assessed at the neck and appendages and was normal except mildly increased muscle tone on the right UE.  Reflexes - The patient's reflexes were decreased on the right UE and LE. She had no pathological reflexes.  Sensory - Light touch, temperature/pinprick were assessed and were symmetrical.    Coordination - The patient had normal movements in the left hand with no ataxia or dysmetria.  Tremor was absent.  Gait and Station - deferred.   ASSESSMENT/PLAN Kathleen [name]  is a 86 y.o. female with history of DM, HLD, HTN, hypothyroidism, and chronic pancreatitis who was initially seen at [facility] on 10/14/19 as a Code Stroke after she presented with right hemiparesis and dysarthria. She did not receive IV t-PA as the last known well could not be obtained. A CTA H&N did not show a LVO. A CTP was negative. The pt. was eventually tx'd to [place]. [facility] for further evaluation and treatment of an acute infarct of the left pons.   Stroke:   L pontine infarct embolic secondary to small vessel disease source  Code Stroke CT head No acute abnormality. Small vessel disease. ASPECTS 10.     CTA head & neck no LVO. Intracranial atherosclerosis w/ severe B PCA stenoses. Aortic atherosclerosis.   MRI  L pontine infarct. Small vessel disease. Atrophy.   2D Echo EF 60-65%. No source of embolus    LDL 134  HgbA1c 5.8  VTE prophylaxis - Heparin 5000 units sq tid   aspirin 81 mg daily prior to admission, now on aspirin 81 mg daily and clopidogrel 75 mg daily x 3 weeks then plavix alone  Therapy recommendations:  SNF  Disposition:  pending   Hypertension  Home meds:  norvasc and lisinopril  Stable . Long-term BP goal normotensive  Hyperlipidemia  Home meds:  Fish oil  Now on lipitor 40  LDL 134, goal < 70  Continue statin at discharge  Diabetes type II Controlled  Home meds:  metformin  HgbA1c 5.8, goal < 7.0  CBGs  SSI  PCP follow up  Dysphagia  Was NPO due ot severe dysarthria  Now passed swallow  On dys1 and thin liquid  Avoid aspiration  Speech following  Other Stroke Risk Factors  Advanced age  Other Active Problems  Hypothyroidism  Arthritis  Diverticulosis   Hearing impaired  Hospital day # 3  Neurology will sign off. Please call with questions. Pt will follow up with stroke clinic NP at [facility] in about 4 weeks. Thanks for the consult.  Rosalin Hawking, MD PhD Stroke Neurology 10/17/2019 4:03 PM    To contact Stroke Continuity provider, please refer to [email or web address] After hours, contact General Neurology

## 2019-10-17 NOTE — Progress Notes (Signed)
  Speech Language Pathology Treatment: Dysphagia  Patient Details Name: CHASIDY JANAK MRN: [id number] DOB: [birth year] Today's Date: 10/17/2019 Time: 8242-3536 SLP Time Calculation (min) (ACUTE ONLY): 20 min  Assessment / Plan / Recommendation Clinical Impression  Patient seen to address swallow function goals with granddaughter present (granddaughter helps patient throughout the week but does not live with her.) Patient appeared fatigued and by end of 20 minute session, was falling asleep. She is very HOH and SLP had to speak very loudly close to her face. Patient was oriented to self, time, place, situation but was not aware of her right side deficits (cannot move right arm, has right sided facial droop and dysarthria. Granddaughter reported that this morning patient was more alert and able to eat more of her meal. SLP observed her with thin liquids (water) via cup sips and patient exhibited delayed swallow initiation but no overt s/s aspiration or penetration. SLP attempted to complete SLE but patient was too fatigued to do so.     HPI HPI: Pt is an 86 y.o. female, with past medical history of diabetes mellitus, GERD, hypertension, hypothyroidism, pancreatitis, who presented to the ED as code stroke due to difficulty with speech and right-sided weakness. CT head was negative for acute changes. CT angiogram head and neck negative for large vessel occlusion, intracranial atherosclerosis and severe bilateral PCA stenosis. MRI brain 9/5: Acute infarct of the left pons. [facility] Swallow Screening was failed.       SLP Plan  Continue with current plan of care       Recommendations  Diet recommendations: Dysphagia 1 (puree);Thin liquid Liquids provided via: Cup;Straw Medication Administration: Crushed with puree Supervision: Staff to assist with self feeding;Full supervision/cueing for compensatory strategies Compensations: Slow rate;Small sips/bites;Monitor for anterior loss;Follow solids with  liquid;Lingual sweep for clearance of pocketing Postural Changes and/or Swallow Maneuvers: Seated upright 90 degrees                Oral Care Recommendations: Oral care BID Follow up Recommendations: Skilled Nursing facility SLP Visit Diagnosis: Dysphagia, oropharyngeal phase (R13.12) Plan: Continue with current plan of care       GO              Sonia Baller, MA, [facility] Pager: [phone]

## 2019-10-18 ENCOUNTER — Inpatient Hospital Stay (HOSPITAL_COMMUNITY): Payer: PPO

## 2019-10-18 LAB — BASIC METABOLIC PANEL
Anion gap: 10 (ref 5–15)
BUN: 7 mg/dL — ABNORMAL LOW (ref 8–23)
CO2: 20 mmol/L — ABNORMAL LOW (ref 22–32)
Calcium: 8.1 mg/dL — ABNORMAL LOW (ref 8.9–10.3)
Chloride: 105 mmol/L (ref 98–111)
Creatinine, Ser: 0.5 mg/dL (ref 0.44–1.00)
GFR calc Af Amer: >60 mL/min (ref >60)
GFR calc non Af Amer: >60 mL/min (ref >60)
Glucose, Bld: 130 mg/dL — ABNORMAL HIGH (ref 70–99)
Potassium: 2.8 mmol/L — ABNORMAL LOW (ref 3.5–5.1)
Sodium: 135 mmol/L (ref 135–145)

## 2019-10-18 LAB — GLUCOSE, CAPILLARY
Glucose-Capillary: 122 mg/dL — ABNORMAL HIGH (ref 70–99)
Glucose-Capillary: 126 mg/dL — ABNORMAL HIGH (ref 70–99)
Glucose-Capillary: 166 mg/dL — ABNORMAL HIGH (ref 70–99)
Glucose-Capillary: 148 mg/dL — ABNORMAL HIGH (ref 70–99)
Glucose-Capillary: 146 mg/dL — ABNORMAL HIGH (ref 70–99)
Glucose-Capillary: 115 mg/dL — ABNORMAL HIGH (ref 70–99)

## 2019-10-18 MED ORDER — POTASSIUM CHLORIDE 10 MEQ/100ML IV SOLN
10.0000 meq | INTRAVENOUS | Status: AC
  Administered 2019-10-18 (×5): 10 meq via INTRAVENOUS
  Filled 2019-10-18 (×2): qty 100

## 2019-10-18 MED ORDER — RESOURCE THICKENUP CLEAR PO POWD
ORAL | Status: DC | PRN
  Filled 2019-10-18: qty 125

## 2019-10-18 NOTE — TOC Progression Note (Signed)
Transition of Care [facility]) - Progression Note    Patient Details  Name: Kathleen [name] MRN: [id number] Date of Birth: 1931-11-09  Transition of Care [facility]) CM/SW Contact  Joanne Chars, LCSW Phone Number: 10/18/2019, 3:54 PM  Clinical Narrative:   CSW met with pt and granddaughter, Kathleen [name], to discuss bed options.  Kathleen [name] asked CSW call [facility] (no beds available) and [facility].  [facility] is able to extend bed offer and granddaughter accepts.  Auth started with [facility].    Expected Discharge Plan: [place] Barriers to Discharge: Continued Medical Work up, SNF Pending bed offer  Expected Discharge Plan and Services Expected Discharge Plan: [place] Choice: [place] arrangements for the past 2 months: Single Family Home                                       Social Determinants of Health (SDOH) Interventions    Readmission Risk Interventions No flowsheet data found.

## 2019-10-18 NOTE — Progress Notes (Signed)
  Speech Language Pathology Treatment: Dysphagia  Patient Details Name: Kathleen [name] MRN: [id number] DOB: 11/08/1931 Today's Date: 10/18/2019 Time: 0712-1975 SLP Time Calculation (min) (ACUTE ONLY): 16 min  Assessment / Plan / Recommendation Clinical Impression  Pt used non dominant hand left hand for feeding with therapist providing scooping assist and grip on utensil. Significant right side weakness with anterior spill and rentention moreso on right. Suspect there are deficits in oral cohesion, laryngeal protection and/or timing that led to immediate and delayed cough with thin liquids consistently. Nectar thick orange juice trials provided without significant difference from subjective view. MBS scheduled for 1330 day and make recommendations thereafter re: po modifications. Medication crushed in puree allowed until MBS.    HPI HPI: Pt is an 86 y.o. female, with past medical history of diabetes mellitus, GERD, hypertension, hypothyroidism, pancreatitis, who presented to the ED as code stroke due to difficulty with speech and right-sided weakness. CT head was negative for acute changes. CT angiogram head and neck negative for large vessel occlusion, intracranial atherosclerosis and severe bilateral PCA stenosis. MRI brain 9/5: Acute infarct of the left pons. [facility] Swallow Screening was failed.       SLP Plan  MBS       Recommendations  Diet recommendations: Other(comment) (MBS @ 1330- will not modify order now)                Oral Care Recommendations: Oral care BID Follow up Recommendations: Skilled Nursing facility SLP Visit Diagnosis: Dysphagia, oropharyngeal phase (R13.12) Plan: MBS                      Houston Siren 10/18/2019, 10:12 AM Orbie Pyo Colvin Caroli.Ed Risk analyst [phone] Office [phone]

## 2019-10-18 NOTE — Progress Notes (Signed)
Physical Therapy Treatment Patient Details Name: Kathleen [name] MRN: [id number] DOB: 26-Feb-1931 Today's Date: 10/18/2019    History of Present Illness Patient is a 86 y/o female who presents with slurred speech, right weakness. NIH:4. Brain MRI- acute infarct left pons. PMH includes HTN, high cholesterol, DM.    PT Comments    Pt demonstrating improved tolerance for EOB sitting and sitting balance this session, participating in dynamic leaning and LE exercise tasks while EOB. Pt continues to require max-total assist for bed mobility at this time, unsafe to transfer after EOB tasks given pt's profound weakness and fatigue. Pt very anxious during mobility, requires considerable cuing for unclenching L hand from bedrails and/or PT during session. Will continue to follow acutely, SNF remains appropriate d/c plan.   Follow Up Recommendations  SNF;Supervision/Assistance - 24 hour     Equipment Recommendations  Other (comment) (defer)    Recommendations for Other Services       Precautions / Restrictions Precautions Precautions: Fall Restrictions Weight Bearing Restrictions: No    Mobility  Bed Mobility Overal bed mobility: Needs Assistance Bed Mobility: Supine to Sit;Sit to Supine;Rolling Rolling: Max assist;+2 for safety/equipment   Supine to sit: Max assist Sit to supine: Max assist   General bed mobility comments: Bilateral rolling for exchange of bed pads, pt soiled in urine. max assist for supine<>sit for trunk and LE management, scooting to and from EOB. Very increased time to perform, pt clutching bedrails/PT with LUE.  Transfers                 General transfer comment: unable this day  Ambulation/Gait             General Gait Details: unable   Stairs             Wheelchair Mobility    Modified Rankin (Stroke Patients Only) Modified Rankin (Stroke Patients Only) Pre-Morbid Rankin Score: Moderate disability Modified Rankin: Severe  disability     Balance     Sitting balance-Leahy Scale: Poor Sitting balance - Comments: Sitting balance continues to be poor, emerging fair. Pt sat EOB x10 minutes, performing LE exercise and lateral leaning tasks. Pt with preference for posterior leaning, requires occasional min assist to correct and to right balance with lateral leaning tasks. Postural control: Posterior lean     Standing balance comment: NT                            Cognition Arousal/Alertness: Awake/alert Behavior During Therapy: Anxious Overall Cognitive Status: Impaired/Different from baseline Area of Impairment: Memory;Following commands;Safety/judgement                     Memory: Decreased short-term memory Following Commands: Follows one step commands inconsistently;Follows one step commands with increased time Safety/Judgement: Decreased awareness of deficits;Decreased awareness of safety   Problem Solving: Slow processing;Decreased initiation;Difficulty sequencing;Requires verbal cues;Requires tactile cues General Comments: Pt requires repeated cuing to follow commands, pt limited by anxiety and fear of falling. Pt L hand clenching onto PT scrubs, hand, bedrail throughout session, can be redirected with max cuing. Pt very HOH and dysarthric during session, communication is difficult at this time.      Exercises General Exercises - Lower Extremity Long Arc Quad: AAROM;Left;5 reps;Seated Heel Slides: AAROM;Strengthening;Left;10 reps;Supine (with cues "pull up" for hip/knee flexion and "push down" for hip/knee extension. +contraction noted) Shoulder Exercises Elbow Flexion: PROM;Right;5 reps;Supine Elbow Extension: PROM;Right;5 reps;Supine Wrist Flexion: PROM;Right;5  reps;Supine Wrist Extension: PROM;Right;5 reps;Supine Other Exercises Other Exercises: Positioning: x2 pillows propping RUE, as pt with + edema in wrist/hand; pillow propping RLE; PT requested PRAFOs for R foot drop,  RN notified of request    General Comments General comments (skin integrity, edema, etc.): VSS, on 3LO2 via [place]      Pertinent Vitals/Pain Pain Assessment: Faces Faces Pain Scale: Hurts a little bit Pain Location: L knee, with ROM Pain Descriptors / Indicators: Sore;Grimacing Pain Intervention(s): Limited activity within patient's tolerance;Monitored during session;Repositioned    Home Living                      Prior Function            PT Goals (current goals can now be found in the care plan section) Acute Rehab PT Goals Patient Stated Goal: to get better PT Goal Formulation: With patient Time For Goal Achievement: 10/30/19 Potential to Achieve Goals: Fair Progress towards PT goals: Progressing toward goals    Frequency    Min 3X/week      PT Plan Current plan remains appropriate    Co-evaluation              AM-PAC PT "6 Clicks" Mobility   Outcome Measure  Help needed turning from your back to your side while in a flat bed without using bedrails?: A Lot Help needed moving from lying on your back to sitting on the side of a flat bed without using bedrails?: Total Help needed moving to and from a bed to a chair (including a wheelchair)?: Total Help needed standing up from a chair using your arms (e.g., wheelchair or bedside chair)?: Total Help needed to walk in hospital room?: Total Help needed climbing 3-5 steps with a railing? : Total 6 Click Score: 7    End of Session Equipment Utilized During Treatment: Oxygen Activity Tolerance: Patient tolerated treatment well;Patient limited by fatigue Patient left: in bed;with call bell/phone within reach;with bed alarm set Nurse Communication: Mobility status PT Visit Diagnosis: Hemiplegia and hemiparesis;Difficulty in walking, not elsewhere classified (R26.2);Unsteadiness on feet (R26.81) Hemiplegia - Right/Left: Right Hemiplegia - dominant/non-dominant: Dominant Hemiplegia - caused by: Cerebral  infarction     Time: 3419-3790 PT Time Calculation (min) (ACUTE ONLY): 24 min  Charges:  $Therapeutic Exercise: 8-22 mins $Neuromuscular Re-education: 8-22 mins                    Trek Kimball E, PT Acute Rehabilitation Services Pager [phone]  Office [phone]   Roxine Caddy D Elonda Husky 10/18/2019, 12:34 PM

## 2019-10-18 NOTE — Care Management Important Message (Signed)
Important Message  Patient Details  Name: PARASKEVI FUNEZ MRN: [id number] Date of Birth: 26-Oct-1931   Medicare Important Message Given:  Yes - Important Message mailed due to current National Emergency  Verbal consent obtained due to current National Emergency  Relationship to patient: [place] Name: Kathyrn Lass Call Date: 10/18/19  Time: 1249 Phone: [phone] Outcome: Spoke with contact Important Message mailed to: Patient address on file      Rebecca Motta Montine Circle 10/18/2019, 12:49 PM

## 2019-10-18 NOTE — Progress Notes (Signed)
Modified Barium Swallow Progress Note  Patient Details  Name: Kathleen [name] MRN: [id number] Date of Birth: 03-27-1931  Today's Date: 10/18/2019  Modified Barium Swallow completed.  Full report located under Chart Review in the Imaging Section.  Brief recommendations include the following:  Clinical Impression  Impairments orally include reduced cohesion, control and propulsion resulting in delayed transit, sublingual and lingual residue and holiding of Dys 2 texture. She was unable to masticate soft cereal bar which was eventually removed. Strength and ROM of laryngeal closure, epiglottic inversion was adequate to obstruct vestibule but mildly delayed allowing thin barium to reach vocal cords without awareness and mild pyriform sinus residue present. Pt is profoundly hard of hearing and was unable (during study) to read instructions to clear her throat. Nectar thick barium is presently recommended, continue puree (Dys 1), crush pills, check oral cavity for pocketing and intermittent throat clear.       Swallow Evaluation Recommendations       SLP Diet Recommendations: Dysphagia 1 (Puree) solids;Nectar thick liquid   Liquid Administration via: Cup;Straw   Medication Administration: Crushed with puree   Supervision: Full assist for feeding;Staff to assist with self feeding   Compensations: Slow rate;Small sips/bites;Lingual sweep for clearance of pocketing;Clear throat intermittently   Postural Changes: Seated upright at 90 degrees   Oral Care Recommendations: Oral care BID   Other Recommendations: Order thickener from pharmacy    [place] Siren 10/18/2019,5:04 PM   Orbie Pyo Colvin Caroli.Ed Risk analyst [phone] Office [phone] '

## 2019-10-18 NOTE — Progress Notes (Signed)
  Speech Language Pathology Treatment: Dysphagia  Patient Details Name: Kathleen [name] MRN: [id number] DOB: Sep 25, 1931 Today's Date: 10/18/2019 Time: 8546-2703 SLP Time Calculation (min) (ACUTE ONLY): 23 min  Assessment / Plan / Recommendation Clinical Impression  Following MBS therapist met with pt's granddaughter in room, reviewed MBS results, educated re: recommendations and demonstrated swallow precautions while pt consumed late lunch tray. Moderate tactile cues needed with feeding,  to check for pocketed food and for refraining from phonation while masticating. Demonstrated appropriate thickness of liquids.    HPI HPI: Pt is an 86 y.o. female, with past medical history of diabetes mellitus, GERD, hypertension, hypothyroidism, pancreatitis, who presented to the ED as code stroke due to difficulty with speech and right-sided weakness. CT head was negative for acute changes. CT angiogram head and neck negative for large vessel occlusion, intracranial atherosclerosis and severe bilateral PCA stenosis. MRI brain 9/5: Acute infarct of the left pons. Yale Swallow Screening was failed.       SLP Plan  Continue with current plan of care       Recommendations  Diet recommendations: Dysphagia 1 (puree);Nectar-thick liquid Liquids provided via: Cup;Straw Medication Administration: Crushed with puree Supervision: Staff to assist with self feeding;Full supervision/cueing for compensatory strategies Compensations: Slow rate;Small sips/bites;Lingual sweep for clearance of pocketing;Clear throat intermittently Postural Changes and/or Swallow Maneuvers: Seated upright 90 degrees                Oral Care Recommendations: Oral care BID Follow up Recommendations: Skilled Nursing facility SLP Visit Diagnosis: Dysphagia, oropharyngeal phase (R13.12) Plan: Continue with current plan of care       GO                [place] Siren 10/18/2019, 5:12 PM    Orbie Pyo Vicky Mccanless M.Ed  Risk analyst [phone] Office [phone]

## 2019-10-18 NOTE — Progress Notes (Addendum)
[facility]  PROGRESS NOTE  Kathleen [name] YIR:[id number] DOB: 1931/05/02 DOA: 10/14/2019 PCP: Aletha Halim., PA-C   Brief HPI:   86 year old female with past medical history of diabetes mellitus type 2, hypertension, GERD, hypothyroidism, chronic pancreatitis came to ED as a code stroke.  Patient did her self when she was seen normal around 5 PM.  Per granddaughter she complained of left knee pain and went to sit on the recliner she went back to check on her again at 5:15 PM patient noted to have slurred speech, right-sided weakness and EMS was called.  In the ED patient was not found to be a TPA candidate since she was out of window.  CTA head and neck showed no large vessel occlusion, intracranial atherosclerosis and severe bilateral PCA stenosis.  CT head showed no evidence of acute stroke. [facility] evaluation.   Subjective   Patient seen and examined, seem by speech therapy, started on dysphagia 1 diet with honey nectar thick liquid.  Continues to have dysarthria   Assessment/Plan:    1. Acute infarct left pons-confirmed on the MRI brain.  Continue aspirin 300 mg rectally daily, patient has failed swallow evaluation.  Echocardiogram done yesterday showed no source of embolus, EF is 60 to 65%.  CTA head and neck done on 10/14/2019 showed no emergent large vessel occlusion, intracranial atherosclerosis with severe bilateral PCA stenosis.  No significant proximal anterior circulation stenosis.  LDL is 134, goal is less than 70.  Continue atorvastatin, neurology recommends aspirin 81 mg and Plavix 75 mg for 3 weeks and then Plavix alone.  PT recommending skilled nursing facility 2. Hypothyroidism-patient is on IV levothyroxine.  TSH is 6.55.  TSH mildly elevated, likely sick euthyroid syndrome.  Will need repeat TSH in 6 to 8 weeks.  Continue same dose of Synthroid. 3. Diabetes mellitus type 2-start sliding scale insulin NovoLog.   Check CBG every 4 hours. 4. GERD without esophagitis-continue PPI 5. Hypertension-amlodipine and Prinivil on hold for permissive hypertension.  Blood pressure is stable. 6. History of glaucoma-continue Xalatan, Cosopt 7. Hypokalemia-potassium was replaced yesterday however potassium was 2.8 this morning.  Will give KCl 10 mEq IV x 5 runs.  Follow BMP in a.m.   COVID-19 Labs  No results for input(s): DDIMER, FERRITIN, LDH, CRP in the last 72 hours.  Lab Results  Component Value Date   [place] NEGATIVE 10/14/2019     Scheduled medications:   .  stroke: mapping our early stages of recovery book   Does not apply Once  . aspirin EC  81 mg Oral Daily  . atorvastatin  40 mg Oral Daily  . clopidogrel  75 mg Oral Daily  . dorzolamide  1 drop Both Eyes BID  . heparin  5,000 Units Subcutaneous Q8H  . insulin aspart  0-9 Units Subcutaneous TID WC  . latanoprost  1 drop Both Eyes QHS  . levothyroxine  50 mcg Intravenous Daily  . pantoprazole (PROTONIX) IV  40 mg Intravenous Q24H  . timolol  1 drop Both Eyes BID         CBG: Recent Labs  Lab 10/17/19 1925 10/17/19 2303 10/18/19 0312 10/18/19 0945 10/18/19 1156  GLUCAP 103* 179* 122* 126* 166*    SpO2: 94 % O2 Flow Rate (L/min): 3 L/min    CBC: Recent Labs  Lab 10/14/19 1823 10/14/19 1827 10/17/19 0751  WBC 8.6  --  8.2  NEUTROABS 6.3  --   --  HGB 13.8 13.9 11.9*  HCT 40.5 41.0 36.9  MCV 90.8  --  93.4  PLT 252  --  834    Basic Metabolic Panel: Recent Labs  Lab 10/14/19 1827 10/14/19 1853 10/17/19 0751 10/18/19 0534  NA 134* 132* 137 135  K 4.6 3.7 3.0* 2.8*  CL 101 100 106 105  CO2  --  20* 20* 20*  GLUCOSE 158* 160* 98 130*  BUN 15 14 7* 7*  CREATININE 0.60 0.59 0.56 0.50  CALCIUM  --  9.1 8.0* 8.1*  MG  --  1.9  --   --      Liver Function Tests: Recent Labs  Lab 10/14/19 1853 10/17/19 0751  AST 24 22  ALT 16 17  ALKPHOS 74 63  BILITOT 0.6 1.0  PROT 7.1 5.5*  ALBUMIN 4.1 2.7*      Antibiotics: Anti-infectives (From admission, onward)   None      DVT prophylaxis: Heparin  Code Status: DNR  Family Communication: No family at bedside    Status is: Inpatient  Dispo: The patient is from: Home              Anticipated d/c is to: Skilled nursing facility              Anticipated d/c date is: 10/30/2019              Patient currently not medically stable for discharge  Barrier to discharge-hypokalemia-awaiting bed at skilled nursing facility     Consultants:  Neurology  Procedures:  Echocardiogram   Objective   Vitals:   10/18/19 0400 10/18/19 0820 10/18/19 1121 10/18/19 1149  BP:  (!) 87/59  (!) 135/50  Pulse:  79 (!) 56 80  Resp: 18 19  (!) 23  Temp:  97.8 F (36.6 C) 98.2 F (36.8 C) 98.1 F (36.7 C)  TempSrc:   Oral Oral  SpO2:  93%  94%  Weight:      Height:        Intake/Output Summary (Last 24 hours) at 10/18/2019 1507 Last data filed at 10/18/2019 0600 Gross per 24 hour  Intake 1621.13 ml  Output 500 ml  Net 1121.13 ml    09/05 1901 - 09/07 0700 In: 3578.3 [P.O.:450; I.V.:3128.2] Out: 825 [Urine:825]  Filed Weights   10/15/19 2325  Weight: 65 kg    Physical Examination:    General-appears in no acute distress  Heart-S1-S2, regular, no murmur auscultated  Lungs-clear to auscultation bilaterally, no wheezing or crackles auscultated  Abdomen-soft, nontender, no organomegaly  Extremities-no edema in the lower extremities  Neuro-alert, dysarthria, right hemiparesis    Data Reviewed:   Recent Results (from the past 240 hour(s))  SARS Coronavirus 2 by RT PCR (hospital order, performed in [facility] hospital lab) Nasopharyngeal Nasopharyngeal Swab     Status: None   Collection Time: 10/14/19  8:51 PM   Specimen: Nasopharyngeal Swab  Result Value Ref Range Status   SARS Coronavirus 2 NEGATIVE NEGATIVE Final    Comment: (NOTE) SARS-CoV-2 target nucleic acids are NOT DETECTED.  The SARS-CoV-2 RNA is  generally detectable in upper and lower respiratory specimens during the acute phase of infection. The lowest concentration of SARS-CoV-2 viral copies this assay can detect is 250 copies / mL. A negative result does not preclude SARS-CoV-2 infection and should not be used as the sole basis for treatment or other patient management decisions.  A negative result may occur with improper specimen collection / handling, submission of  specimen other than nasopharyngeal swab, presence of viral mutation(s) within the areas targeted by this assay, and inadequate number of viral copies (<250 copies / mL). A negative result must be combined with clinical observations, patient history, and epidemiological information.  Fact Sheet for Patients:   StrictlyIdeas.no  Fact Sheet for Healthcare Providers: BankingDealers.co.za  This test is not yet approved or  cleared by the [place] FDA and has been authorized for detection and/or diagnosis of SARS-CoV-2 by FDA under an Emergency Use Authorization (EUA).  This EUA will remain in effect (meaning this test can be used) for the duration of the COVID-19 declaration under Section 564(b)(1) of the Act, 21 U.S.C. section 360bbb-3(b)(1), unless the authorization is terminated or revoked sooner.  Performed at [facility], [address], [place],  [address]       [facility] If 7PM-7AM, please contact night-coverage at [email or web address], Office  [phone]   10/18/2019, 3:07 PM  LOS: 4 days

## 2019-10-18 NOTE — Evaluation (Signed)
Speech Language Pathology Evaluation Patient Details Name: Kathleen [name] MRN: [id number] DOB: Apr 18, 1931 Today's Date: 10/18/2019 Time: 6962-9528 SLP Time Calculation (min) (ACUTE ONLY): 16 min  Problem List:  Patient Active Problem List   Diagnosis Date Noted  . CVA (cerebrovascular accident due to intracerebral hemorrhage) ([place]) 10/14/2019  . Leg swelling 05/07/2018  . Chronic non-seasonal allergic rhinitis 04/28/2018  . GERD without esophagitis 04/28/2018  . Hyperlipidemia associated with type 2 diabetes mellitus ([place]) 04/28/2018  . Chronic open angle glaucoma 04/28/2018  . Constipation in female 04/28/2018  . Hyponatremia 04/25/2018  . Hallucinations 04/25/2018  . Acute bronchitis 04/24/2018  . CD (contact dermatitis) 04/24/2018  . Dermatitis, eczematoid 04/24/2018  . Allergic reaction 05/22/2016  . Fracture   . Controlled type 2 diabetes mellitus without complication, without long-term current use of insulin ([place]) 01/16/2015  . Hip fracture, left ([place]) 01/15/2015  . Varicose veins of leg with complications 41/32/4401  . Exocrine pancreatic insufficiency ([place]) 11/03/2013  . Chronic pancreatitis ([place]) 11/03/2013  . Pancreatitis 01/27/2012  . Hypokalemia 01/27/2012  . Hypertension associated with type 2 diabetes mellitus ([place])   . Hypothyroidism    Past Medical History:  Past Medical History:  Diagnosis Date  . Arthritis   . Diabetes mellitus without complication ([place])    "borderline"  . Diverticulosis of colon   . High cholesterol   . Hypertension   . Hypothyroidism   . Pancreatitis    Past Surgical History:  Past Surgical History:  Procedure Laterality Date  . ABDOMINAL HYSTERECTOMY     partial  . APPENDECTOMY    . bladder tac    . CHOLECYSTECTOMY  1999   cholelithiasis  . COLONOSCOPY  01/2007   Dr Olevia Perches: normal  . COLONOSCOPY  12/2000   Dr Deatra Ina- diverticulosis  . ESOPHAGOGASTRODUODENOSCOPY     Dr Damaris Hippo cannot remember  . EUS N/A 03/24/2012   Dr.  Paulita Fujita: suspected passed CBD stone as cause of acute pancreatitis, suspective chronic pancreatitis.   Marland Kitchen EXCISION OF BREAST BIOPSY    . HIP PINNING,CANNULATED Left 01/15/2015   Procedure: CANNULATED HIP PINNING/LEFT;  Surgeon: Renette Butters, MD;  Location: [facility];  Service: Orthopedics;  Laterality: Left;  Marland Kitchen VEIN SURGERY     HPI:  Pt is an 86 y.o. female, with past medical history of diabetes mellitus, GERD, hypertension, hypothyroidism, pancreatitis, who presented to the ED as code stroke due to difficulty with speech and right-sided weakness. CT head was negative for acute changes. CT angiogram head and neck negative for large vessel occlusion, intracranial atherosclerosis and severe bilateral PCA stenosis. MRI brain 9/5: Acute infarct of the left pons. Yale Swallow Screening was failed.    Assessment / Plan / Recommendation Clinical Impression  Communication with pt is challenging given her significant hearing loss and dysarthria during discourse with staff (and formal assessment with [facility] or SLUMS not possible presently). She was able to read and comprehend instructions and information once written. She was not oriented to place (thought she was at home and in [place]). Stated she had a stroke but demonstrated poor awareness to motor, speech and cognitive differences. Mild evidence that pt retained spatial orientation to [place] at end of assessment. Mild reduced sustained attention becoming distracted with bed linens and drownsiness. Continued ST for increased independence in speech intelligibility and cognition recommended.           SLP Assessment  SLP Recommendation/Assessment: Patient needs continued Speech Lanaguage Pathology Services SLP Visit Diagnosis: Cognitive communication deficit (R41.841);Dysarthria and  anarthria (R47.1)    Follow Up Recommendations  Skilled Nursing facility    Frequency and Duration min 2x/week  2 weeks      SLP Evaluation Cognition  Overall  Cognitive Status: Impaired/Different from baseline Arousal/Alertness: Awake/alert Orientation Level: Oriented to person;Disoriented to place;Oriented to situation Attention: Sustained Sustained Attention: Impaired Sustained Attention Impairment: Verbal basic;Functional basic Memory:  (will be assessed further) Awareness: Impaired Awareness Impairment: Intellectual impairment;Emergent impairment;Anticipatory impairment Problem Solving: Impaired Problem Solving Impairment: Verbal basic;Functional basic Safety/Judgment: Impaired       Comprehension  Auditory Comprehension Overall Auditory Comprehension: Appears within functional limits for tasks assessed Interfering Components: Attention Visual Recognition/Discrimination Discrimination: Not tested Reading Comprehension Reading Status: Within funtional limits    Expression Expression Primary Mode of Expression: Verbal Verbal Expression Overall Verbal Expression: Appears within functional limits for tasks assessed Initiation: No impairment Level of Generative/Spontaneous Verbalization: Conversation Repetition:  (NT) Naming: Not tested (no impairments inforamally) Pragmatics: No impairment Written Expression Dominant Hand: Right Written Expression: Not tested   Oral / Motor  Oral Motor/Sensory Function Overall Oral Motor/Sensory Function: Moderate impairment Facial ROM: Reduced right Facial Symmetry: Abnormal symmetry right;Suspected CN VII (facial) dysfunction Facial Strength: Reduced right;Suspected CN VII (facial) dysfunction Facial Sensation: Within Functional Limits Lingual ROM: Reduced right;Reduced left Lingual Symmetry: Within Functional Limits Lingual Strength: Reduced;Suspected CN XII (hypoglossal) dysfunction Lingual Sensation: Within Functional Limits Velum: Within Functional Limits Motor Speech Overall Motor Speech: Impaired Respiration: Impaired Level of Impairment: Conversation Phonation: Normal Resonance:  Hypernasality Articulation: Impaired Level of Impairment: Phrase Intelligibility: Intelligibility reduced Word: 75-100% accurate Phrase: 75-100% accurate Sentence: 50-74% accurate Conversation: 50-74% accurate (cloer to 50%) Motor Planning: Witnin functional limits   GO                    [place] Siren 10/18/2019, 10:40 AM Orbie Pyo Nanako Stopher M.Ed Risk analyst [phone] Office [phone]

## 2019-10-18 NOTE — Plan of Care (Signed)
Discussed with patient plan of care for the evening,pain management and reason why she is here with stroke deficits with no evidence of learning at this time

## 2019-10-19 ENCOUNTER — Inpatient Hospital Stay
Admission: RE | Admit: 2019-10-19 | Discharge: 2019-12-12 | Disposition: E | Payer: PPO | Source: Ambulatory Visit | Attending: Internal Medicine | Admitting: Internal Medicine

## 2019-10-19 DIAGNOSIS — I639 Cerebral infarction, unspecified: Secondary | ICD-10-CM | POA: Diagnosis not present

## 2019-10-19 DIAGNOSIS — Z741 Need for assistance with personal care: Secondary | ICD-10-CM | POA: Diagnosis not present

## 2019-10-19 DIAGNOSIS — R41841 Cognitive communication deficit: Secondary | ICD-10-CM | POA: Diagnosis not present

## 2019-10-19 DIAGNOSIS — I69122 Dysarthria following nontraumatic intracerebral hemorrhage: Secondary | ICD-10-CM | POA: Diagnosis not present

## 2019-10-19 DIAGNOSIS — E870 Hyperosmolality and hypernatremia: Secondary | ICD-10-CM | POA: Diagnosis not present

## 2019-10-19 DIAGNOSIS — E1169 Type 2 diabetes mellitus with other specified complication: Secondary | ICD-10-CM | POA: Diagnosis not present

## 2019-10-19 DIAGNOSIS — R471 Dysarthria and anarthria: Secondary | ICD-10-CM | POA: Diagnosis not present

## 2019-10-19 DIAGNOSIS — R400 Somnolence: Secondary | ICD-10-CM | POA: Diagnosis not present

## 2019-10-19 DIAGNOSIS — R29810 Facial weakness: Secondary | ICD-10-CM | POA: Diagnosis not present

## 2019-10-19 DIAGNOSIS — M79621 Pain in right upper arm: Secondary | ICD-10-CM | POA: Diagnosis not present

## 2019-10-19 DIAGNOSIS — K5790 Diverticulosis of intestine, part unspecified, without perforation or abscess without bleeding: Secondary | ICD-10-CM | POA: Diagnosis not present

## 2019-10-19 DIAGNOSIS — H401134 Primary open-angle glaucoma, bilateral, indeterminate stage: Secondary | ICD-10-CM | POA: Diagnosis not present

## 2019-10-19 DIAGNOSIS — R627 Adult failure to thrive: Secondary | ICD-10-CM | POA: Diagnosis not present

## 2019-10-19 DIAGNOSIS — R1312 Dysphagia, oropharyngeal phase: Secondary | ICD-10-CM | POA: Diagnosis not present

## 2019-10-19 DIAGNOSIS — M1990 Unspecified osteoarthritis, unspecified site: Secondary | ICD-10-CM | POA: Diagnosis not present

## 2019-10-19 DIAGNOSIS — R279 Unspecified lack of coordination: Secondary | ICD-10-CM | POA: Diagnosis not present

## 2019-10-19 DIAGNOSIS — E876 Hypokalemia: Secondary | ICD-10-CM | POA: Diagnosis not present

## 2019-10-19 DIAGNOSIS — Z9181 History of falling: Secondary | ICD-10-CM | POA: Diagnosis not present

## 2019-10-19 DIAGNOSIS — I10 Essential (primary) hypertension: Secondary | ICD-10-CM | POA: Diagnosis not present

## 2019-10-19 DIAGNOSIS — M2550 Pain in unspecified joint: Secondary | ICD-10-CM | POA: Diagnosis not present

## 2019-10-19 DIAGNOSIS — E119 Type 2 diabetes mellitus without complications: Secondary | ICD-10-CM | POA: Diagnosis not present

## 2019-10-19 DIAGNOSIS — E785 Hyperlipidemia, unspecified: Secondary | ICD-10-CM | POA: Diagnosis not present

## 2019-10-19 DIAGNOSIS — I619 Nontraumatic intracerebral hemorrhage, unspecified: Secondary | ICD-10-CM | POA: Diagnosis not present

## 2019-10-19 DIAGNOSIS — K219 Gastro-esophageal reflux disease without esophagitis: Secondary | ICD-10-CM | POA: Diagnosis not present

## 2019-10-19 DIAGNOSIS — R2231 Localized swelling, mass and lump, right upper limb: Secondary | ICD-10-CM | POA: Diagnosis not present

## 2019-10-19 DIAGNOSIS — R5381 Other malaise: Secondary | ICD-10-CM | POA: Diagnosis not present

## 2019-10-19 DIAGNOSIS — J690 Pneumonitis due to inhalation of food and vomit: Secondary | ICD-10-CM | POA: Diagnosis not present

## 2019-10-19 DIAGNOSIS — I69951 Hemiplegia and hemiparesis following unspecified cerebrovascular disease affecting right dominant side: Secondary | ICD-10-CM | POA: Diagnosis not present

## 2019-10-19 DIAGNOSIS — M6281 Muscle weakness (generalized): Secondary | ICD-10-CM | POA: Diagnosis not present

## 2019-10-19 DIAGNOSIS — E039 Hypothyroidism, unspecified: Secondary | ICD-10-CM | POA: Diagnosis not present

## 2019-10-19 DIAGNOSIS — M159 Polyosteoarthritis, unspecified: Secondary | ICD-10-CM | POA: Diagnosis not present

## 2019-10-19 DIAGNOSIS — R531 Weakness: Secondary | ICD-10-CM | POA: Diagnosis not present

## 2019-10-19 DIAGNOSIS — R404 Transient alteration of awareness: Secondary | ICD-10-CM | POA: Diagnosis not present

## 2019-10-19 DIAGNOSIS — J3089 Other allergic rhinitis: Secondary | ICD-10-CM | POA: Diagnosis not present

## 2019-10-19 DIAGNOSIS — E1159 Type 2 diabetes mellitus with other circulatory complications: Secondary | ICD-10-CM | POA: Diagnosis not present

## 2019-10-19 DIAGNOSIS — G8191 Hemiplegia, unspecified affecting right dominant side: Secondary | ICD-10-CM | POA: Diagnosis not present

## 2019-10-19 DIAGNOSIS — H9190 Unspecified hearing loss, unspecified ear: Secondary | ICD-10-CM | POA: Diagnosis not present

## 2019-10-19 DIAGNOSIS — K861 Other chronic pancreatitis: Secondary | ICD-10-CM | POA: Diagnosis not present

## 2019-10-19 DIAGNOSIS — I69391 Dysphagia following cerebral infarction: Secondary | ICD-10-CM | POA: Diagnosis not present

## 2019-10-19 DIAGNOSIS — Z7401 Bed confinement status: Secondary | ICD-10-CM | POA: Diagnosis not present

## 2019-10-19 DIAGNOSIS — E1149 Type 2 diabetes mellitus with other diabetic neurological complication: Secondary | ICD-10-CM | POA: Diagnosis not present

## 2019-10-19 DIAGNOSIS — E875 Hyperkalemia: Secondary | ICD-10-CM | POA: Diagnosis not present

## 2019-10-19 LAB — SARS CORONAVIRUS 2 BY RT PCR (HOSPITAL ORDER, PERFORMED IN ~~LOC~~ HOSPITAL LAB): SARS Coronavirus 2: NEGATIVE

## 2019-10-19 LAB — BASIC METABOLIC PANEL
Anion gap: 8 (ref 5–15)
BUN: 6 mg/dL — ABNORMAL LOW (ref 8–23)
CO2: 20 mmol/L — ABNORMAL LOW (ref 22–32)
Calcium: 8.1 mg/dL — ABNORMAL LOW (ref 8.9–10.3)
Chloride: 108 mmol/L (ref 98–111)
Creatinine, Ser: 0.52 mg/dL (ref 0.44–1.00)
GFR calc Af Amer: >60 mL/min (ref >60)
GFR calc non Af Amer: >60 mL/min (ref >60)
Glucose, Bld: 115 mg/dL — ABNORMAL HIGH (ref 70–99)
Potassium: 3.2 mmol/L — ABNORMAL LOW (ref 3.5–5.1)
Sodium: 136 mmol/L (ref 135–145)

## 2019-10-19 LAB — GLUCOSE, CAPILLARY
Glucose-Capillary: 118 mg/dL — ABNORMAL HIGH (ref 70–99)
Glucose-Capillary: 106 mg/dL — ABNORMAL HIGH (ref 70–99)
Glucose-Capillary: 125 mg/dL — ABNORMAL HIGH (ref 70–99)

## 2019-10-19 MED ORDER — ASPIRIN EC 81 MG PO TBEC: 81.0000 mg | DELAYED_RELEASE_TABLET | Freq: Every day | ORAL | 0 refills | Status: DC

## 2019-10-19 MED ORDER — CLOPIDOGREL BISULFATE 75 MG PO TABS
75.0000 mg | ORAL_TABLET | Freq: Every day | ORAL | 3 refills | Status: DC
Start: 2019-10-19 — End: 2019-11-15

## 2019-10-19 MED ORDER — RESOURCE THICKENUP CLEAR PO POWD: ORAL | Status: DC

## 2019-10-19 MED ORDER — POTASSIUM CHLORIDE CRYS ER 20 MEQ PO TBCR
40.0000 meq | EXTENDED_RELEASE_TABLET | Freq: Once | ORAL | Status: AC
  Administered 2019-10-19: 40 meq via ORAL
  Filled 2019-10-19: qty 2

## 2019-10-19 MED ORDER — ATORVASTATIN CALCIUM 40 MG PO TABS
40.0000 mg | ORAL_TABLET | Freq: Every day | ORAL | 0 refills | Status: DC
Start: 2019-10-19 — End: 2019-11-15

## 2019-10-19 MED ORDER — LEVOTHYROXINE SODIUM 100 MCG PO TABS: 100.0000 ug | ORAL_TABLET | Freq: Every day | ORAL | 11 refills | Status: DC

## 2019-10-19 MED ORDER — LATANOPROST 0.005 % OP SOLN: 1.0000 [drp] | Freq: Every day | OPHTHALMIC | 12 refills | Status: AC

## 2019-10-19 NOTE — TOC Transition Note (Signed)
Transition of Care [facility]) - CM/SW Discharge Note   Patient Details  Name: Kathleen [name] MRN: [id number] Date of Birth: [birth year]  [facility] Care [facility] CM/SW Contact:  Joanne Chars, LCSW Phone Number: 11/09/2019, 1:24 PM   Clinical Narrative:   Pt going to [facility], Room 128.  RN call report to [phone].  PTAR called [phone].     Final next level of care: [place] Barriers to Discharge: Barriers Resolved   Patient Goals and CMS Choice Patient states their goals for this hospitalization and ongoing recovery are:: get back home      Discharge Placement   Existing PASRR number confirmed : 10/18/19          Patient chooses bed at: [facility] Patient to be transferred to facility by: [place] Name of family member notified: Kathyrn Lass Patient and family notified of of transfer: 10/30/2019  Discharge Plan and Services     Post Acute Care Choice: [place]                               Social Determinants of Health (SDOH) Interventions     Readmission Risk Interventions No flowsheet data found.

## 2019-10-19 NOTE — Progress Notes (Signed)
  Speech Language Pathology Treatment: Dysphagia;Cognitive-Linquistic  Patient Details Name: Kathleen [name] MRN: [id number] DOB: 07/26/1931 Today's Date: 10/12/2019 Time: 5093-2671 SLP Time Calculation (min) (ACUTE ONLY): 20 min  Assessment / Plan / Recommendation Clinical Impression  Suspect pt may have had penetrated or aspirated. Per RN, she had hard coughs after bites of puree sausage. On arrival she sounds congested and appears fatigued. Her speech was slightly less intelligible this morning. Hearing loss makes education of compensatory strategies for (dysathria and dysphagia) challenging. If written,  pt is able to process information most of the time but follows inconsistently. When she became frustrated, intensity and single words became more distinct and intelligible.  Delayed coughs x 2 during trials puree and nectar thick liquids, mildly delayed swallows. She did not phonate immediately after swallows today. Pt scheduled to be discharged to nursing facility today. Recommend continue puree texture, nectar thick liquids, crush pills, check right side oral cavity for pocketed food, sit upright, ensure pt has swallowed before next bite and ask pt to cough periodically.   HPI HPI: Pt is an 86 y.o. female, with past medical history of diabetes mellitus, GERD, hypertension, hypothyroidism, pancreatitis, who presented to the ED as code stroke due to difficulty with speech and right-sided weakness. CT head was negative for acute changes. CT angiogram head and neck negative for large vessel occlusion, intracranial atherosclerosis and severe bilateral PCA stenosis. MRI brain 9/5: Acute infarct of the left pons. [facility] Swallow Screening was failed.       SLP Plan  Continue with current plan of care       Recommendations  Diet recommendations: Dysphagia 1 (puree);Nectar-thick liquid Liquids provided via: Cup;Straw Medication Administration: Crushed with puree Supervision: Staff to assist with self  feeding;Full supervision/cueing for compensatory strategies Compensations: Slow rate;Small sips/bites;Lingual sweep for clearance of pocketing;Clear throat intermittently Postural Changes and/or Swallow Maneuvers: Seated upright 90 degrees                Oral Care Recommendations: Oral care BID Follow up Recommendations: Skilled Nursing facility SLP Visit Diagnosis: Dysphagia, oropharyngeal phase (R13.12);Dysarthria and anarthria (R47.1) Plan: Continue with current plan of care       GO                Houston Siren 10/31/2019, 12:12 PM Orbie Pyo Colvin Caroli.Ed Risk analyst [phone] Office [phone]

## 2019-10-19 NOTE — TOC Progression Note (Signed)
Transition of Care [facility]) - Progression Note    Patient Details  Name: Kathleen [name] MRN: [id number] Date of Birth: Jan 07, 1932  Transition of Care [facility]) CM/SW Contact  Joanne Chars, LCSW Phone Number: 10/13/2019, 9:12 AM  Clinical Narrative:   CSW received call from [place] at [facility] with authorization information.  S[facility] is approved, Auth [id number] good for 7 days.  PTAR transport also approved auth # [id number].     Expected Discharge Plan: [place] Barriers to Discharge: Continued Medical Work up, SNF Pending bed offer  Expected Discharge Plan and Services Expected Discharge Plan: [place] Choice: [place] arrangements for the past 2 months: Single Family Home                                       Social Determinants of Health (SDOH) Interventions    Readmission Risk Interventions No flowsheet data found.

## 2019-10-19 NOTE — Discharge Summary (Signed)
Physician Discharge Summary  Kathleen [name] WSF:[id number] DOB: 1931/08/25 DOA: 10/14/2019  PCP: Aletha Halim., PA-C  Admit date: 10/14/2019 Discharge date: 10/31/2019  Admitted From: HOME.  Disposition:  snf  Recommendations for Outpatient Follow-up:  1. Follow up with PCP in 1-2 weeks 2. Please obtain BMP/CBC in 1 TO 2 DAYS.  Please follow up with Neurology as recommended.  Please follow up with SLP as outpatient.  Please follow up with outpatient palliative care   Discharge Condition:GUARDED CODE STATUS:DNR Diet recommendation: / Dysphagia 1 diet with nectar thick liquids  Brief/Interim Summary: 86 year old female with past medical history of diabetes mellitus type 2, hypertension, GERD, hypothyroidism, chronic pancreatitis came to ED as a code stroke.  Patient did her self when she was seen normal around 5 PM.  Per granddaughter she complained of left knee pain and went to sit on the recliner she went back to check on her again at 5:15 PM patient noted to have slurred speech, right-sided weakness and EMS was called.  In the ED patient was not found to be a TPA candidate since she was out of window.  CTA head and neck showed no large vessel occlusion, intracranial atherosclerosis and severe bilateral PCA stenosis.  MRI  Of the brain showed Acute infarct of the left pons. No hemorrhage or mass effect. . Generalized atrophy and findings of chronic small vessel disease. Neurology consulted and recommendations given to continue with aspirin and plavix for 3 weeks followed by plavix alone and statin.  PT eval recommending SNF.    Discharge Diagnoses:  Active Problems:   Hypothyroidism   Chronic pancreatitis ([place])   Controlled type 2 diabetes mellitus without complication, without long-term current use of insulin (HCC)   GERD without esophagitis   Hyperlipidemia associated with type 2 diabetes mellitus (HCC)   CVA (cerebrovascular accident due to intracerebral hemorrhage)  ([place])  1. Acute infarct left pons-confirmed on the MRI brain.   Echocardiogram done yesterday showed no source of embolus, EF is 60 to 65%.  CTA head and neck done on 10/14/2019 showed no emergent large vessel occlusion, intracranial atherosclerosis with severe bilateral PCA stenosis.  No significant proximal anterior circulation stenosis.  LDL is 134, goal is less than 70.  Continue atorvastatin, neurology recommends aspirin 81 mg and Plavix 75 mg for 3 weeks and then Plavix alone.  PT recommending skilled nursing facility 2. Hypothyroidism-TSH is 6.55.  TSH mildly elevated, likely sick euthyroid syndrome.  Will need repeat TSH in 6 to 8 weeks.  Continue same dose of Synthroid. 3. Diabetes mellitus type 2 resume home meds. A1c is 5.8% 4. GERD without esophagitis-continue PPI 5. Hypertension-amlodipine and Prinivil on hold for permissive hypertension. resume the same on discharge.  6. History of glaucoma-continue Xalatan, Cosopt 7. Hypokalemia-potassium was replaced. Repeat BMP in 1 to 2 days.    Discharge Instructions  Discharge Instructions    Ambulatory referral to Neurology   Complete by: As directed    Follow up with stroke clinic NP (Jessica Vanschaick or Cecille Rubin, if both not available, consider Zachery Dauer, or [facility]) at [facility] in about 4 weeks. Thanks.   Diet - low sodium heart healthy   Complete by: As directed    Discharge instructions   Complete by: As directed    Please follow up with Neurology as recommended.     Allergies as of 10/26/2019      Reactions   Alendronate    Unknown reaction   Cephalexin Other (See Comments)  Unknown reaction Unknown reaction Unknown reaction   Clindamycin/lincomycin Hives   Creon [pancreatin] Hives   Doxycycline Other (See Comments)   Unknown reaction Unknown reaction   Florastor [yeast] Hives   Penicillins Other (See Comments)   Has patient had a PCN reaction causing immediate rash, facial/tongue/throat swelling, SOB or  lightheadedness with hypotension: unknown Has patient had a PCN reaction causing severe rash involving mucus membranes or skin necrosis: unknown Has patient had a PCN reaction that required hospitalization unknown Has patient had a PCN reaction occurring within the last 10 years:unknown If all of the above answers are "NO", then may proceed with Cephalosporin use.   Zenpep [pancrelipase (lip-prot-amyl)] Hives      Medication List    STOP taking these medications   levocetirizine 5 MG tablet Commonly known as: XYZAL     TAKE these medications   amLODipine 5 MG tablet Commonly known as: NORVASC Take 5 mg by mouth every morning.   aspirin EC 81 MG tablet Take 1 tablet (81 mg total) by mouth at bedtime for 21 days.   atorvastatin 40 MG tablet Commonly known as: LIPITOR Take 1 tablet (40 mg total) by mouth daily.   carboxymethylcellulose 0.5 % Soln Commonly known as: REFRESH PLUS Place 1 drop into both eyes 3 (three) times daily as needed (for dry eyes).   clopidogrel 75 MG tablet Commonly known as: PLAVIX Take 1 tablet (75 mg total) by mouth daily.   famotidine 40 MG tablet Commonly known as: PEPCID Take 40 mg by mouth 2 (two) times daily.   fexofenadine 180 MG tablet Commonly known as: ALLEGRA Take 180 mg by mouth daily.   Fish Oil 1000 MG Caps Take 1 capsule by mouth daily.   hydrOXYzine 25 MG tablet Commonly known as: ATARAX/VISTARIL Take 25-50 mg by mouth every 8 (eight) hours as needed.   latanoprost 0.005 % ophthalmic solution Commonly known as: XALATAN Place 1 drop into both eyes at bedtime.   levothyroxine 100 MCG tablet Commonly known as: Synthroid Take 1 tablet (100 mcg total) by mouth daily. What changed:   medication strength  how much to take   lisinopril 2.5 MG tablet Commonly known as: ZESTRIL Take 2.5 mg by mouth daily.   metFORMIN 500 MG 24 hr tablet Commonly known as: GLUCOPHAGE-XR Take 500 mg by mouth daily with breakfast.    montelukast 10 MG tablet Commonly known as: SINGULAIR Take 10 mg by mouth daily.   multivitamin with minerals Tabs tablet Take 1 tablet by mouth daily.   potassium chloride SA 20 MEQ tablet Commonly known as: KLOR-CON Take 20 mEq by mouth daily.   Resource ThickenUp Clear Powd Use as needed.       Contact information for follow-up providers    [facility]. Schedule an appointment as soon as possible for a visit in 4 week(s).   Specialty: Neurology Contact information: [address] [place] [phone]       Aletha Halim., PA-C. Schedule an appointment as soon as possible for a visit in 1 week(s).   Specialty: Family Medicine Contact information: [address] [place] [address] [phone]            Contact information for after-discharge care    [facility] .   Service: Skilled Nursing Contact information: [address] [place] [phone]  Allergies  Allergen Reactions  . Alendronate     Unknown reaction  . Cephalexin Other (See Comments)    Unknown reaction Unknown reaction Unknown reaction  . Clindamycin/Lincomycin Hives  . Creon [Pancreatin] Hives  . Doxycycline Other (See Comments)    Unknown reaction Unknown reaction  . Florastor [Yeast] Hives  . Penicillins Other (See Comments)    Has patient had a PCN reaction causing immediate rash, facial/tongue/throat swelling, SOB or lightheadedness with hypotension: unknown Has patient had a PCN reaction causing severe rash involving mucus membranes or skin necrosis: unknown Has patient had a PCN reaction that required hospitalization unknown Has patient had a PCN reaction occurring within the last 10 years:unknown If all of the above answers are "NO", then may proceed with Cephalosporin use.  Marland Kitchen Zenpep [Pancrelipase (Lip-Prot-Amyl)]  Hives    Consultations:  Neurology.    Procedures/Studies: MR BRAIN WO CONTRAST  Result Date: 10/16/2019 CLINICAL DATA:  Right-sided weakness and speech changes EXAM: MRI HEAD WITHOUT CONTRAST TECHNIQUE: Multiplanar, multiecho pulse sequences of the brain and surrounding structures were obtained without intravenous contrast. COMPARISON:  None. FINDINGS: Brain: There is an acute infarct of the left pons. No other acute ischemia. No acute hemorrhage. Multifocal hyperintense T2-weighted signal within the white matter. There is generalized atrophy without lobar predilection. Chronic microhemorrhage in the right thalamus. Old blood products in the right temporal lobe. Normal midline structures. Vascular: Normal flow voids. Skull and upper cervical spine: Normal marrow signal. Sinuses/Orbits: Left mastoid effusion. Bilateral ocular lens replacements. Other: None IMPRESSION: 1. Acute infarct of the left pons. No hemorrhage or mass effect. 2. Generalized atrophy and findings of chronic small vessel disease. Electronically Signed   By: Ulyses Jarred M.D.   On: 10/16/2019 01:24   CT CEREBRAL PERFUSION W CONTRAST  Result Date: 10/14/2019 CLINICAL DATA:  Right facial droop and slurred speech. EXAM: CT ANGIOGRAPHY HEAD AND NECK CT PERFUSION BRAIN TECHNIQUE: Multidetector CT imaging of the head and neck was performed using the standard protocol during bolus administration of intravenous contrast. Multiplanar CT image reconstructions and MIPs were obtained to evaluate the vascular anatomy. Carotid stenosis measurements (when applicable) are obtained utilizing NASCET criteria, using the distal internal carotid diameter as the denominator. Multiphase CT imaging of the brain was performed following IV bolus contrast injection. Subsequent parametric perfusion maps were calculated using RAPID software. CONTRAST:  166mL OMNIPAQUE IOHEXOL 350 MG/ML SOLN COMPARISON:  None. FINDINGS: CTA NECK FINDINGS Aortic arch: Normal  variant aortic arch branching pattern with common origin of the brachiocephalic and left common carotid arteries. Mild aortic arch atherosclerosis without significant arch vessel origin stenosis. Right carotid system: Patent with minimal nonstenotic calcified plaque at the carotid bifurcation. Mildly ectatic appearance of the distal cervical ICA. Left carotid system: Patent with mild nonstenotic predominantly calcified plaque in the proximal ICA. Mildly ectatic appearance of the distal cervical ICA. Vertebral arteries: Patent without evidence of stenosis or dissection. Moderately dominant left vertebral artery. Skeleton: Moderately advanced cervical facet arthrosis with grade 1 anterolisthesis of C6 on C7 and C5 on C6. Left facet ankylosis at C5-6. Mild cervical disc degeneration. Other neck: No evidence of cervical lymphadenopathy or mass. Upper chest: Minimal dependent atelectasis in the superior segments of the lower lobes. Review of the MIP images confirms the above findings CTA HEAD FINDINGS Anterior circulation: The internal carotid arteries are patent from skull base to carotid termini with calcified plaque resulting in up to mild supraclinoid stenosis bilaterally. ACAs and MCAs are patent without evidence of a  proximal branch occlusion or significant proximal stenosis. No aneurysm is identified. Posterior circulation: The intracranial vertebral arteries are patent to the basilar. Left V4 segment plaque results in luminal irregularity and up to mild stenosis. Patent PICA and SCA origins are identified bilaterally. The basilar artery is patent and mildly irregular without significant stenosis. Posterior communicating arteries are diminutive or absent. The PCAs are patent with severe stenoses noted at the left P1-P2 junction and in the proximal right P3 segment. No aneurysm is identified. Venous sinuses: Patent. Anatomic variants: None. Review of the MIP images confirms the above findings CT Brain Perfusion  Findings: ASPECTS: 10 CBF (<30%) Volume: 0 mL Perfusion (Tmax>6.0s) volume: 13mL, located along the floor of the left anterior cranial fossa and considered artifactual Mismatch Volume: n/a Infarction Location: None evident by CTP IMPRESSION: 1. No emergent large vessel occlusion. 2. Intracranial atherosclerosis with severe bilateral PCA stenoses. No significant proximal anterior circulation stenosis. 3. Widely patent cervical carotid and vertebral arteries. 4. Negative CTP. 5. Aortic Atherosclerosis (ICD10-I70.0). Electronically Signed   By: Logan Bores M.D.   On: 10/14/2019 19:37   DG Swallowing Func-Speech Pathology  Result Date: 10/18/2019 Objective Swallowing Evaluation: Type of Study: MBS-Modified Barium Swallow Study  Patient Details Name: MALEEKA SABATINO MRN: [id number] Date of Birth: 02-25-1931 Today's Date: 10/18/2019 Time: SLP Start Time (ACUTE ONLY): 1358 -SLP Stop Time (ACUTE ONLY): 1420 SLP Time Calculation (min) (ACUTE ONLY): 22 min Past Medical History: Past Medical History: Diagnosis Date . Arthritis  . Diabetes mellitus without complication ([place])   "borderline" . Diverticulosis of colon  . High cholesterol  . Hypertension  . Hypothyroidism  . Pancreatitis  Past Surgical History: Past Surgical History: Procedure Laterality Date . ABDOMINAL HYSTERECTOMY    partial . APPENDECTOMY   . bladder tac   . CHOLECYSTECTOMY  1999  cholelithiasis . COLONOSCOPY  01/2007  Dr Olevia Perches: normal . COLONOSCOPY  12/2000  Dr Deatra Ina- diverticulosis . ESOPHAGOGASTRODUODENOSCOPY    Dr Damaris Hippo cannot remember . EUS N/A 03/24/2012  Dr. Paulita Fujita: suspected passed CBD stone as cause of acute pancreatitis, suspective chronic pancreatitis.  Marland Kitchen EXCISION OF BREAST BIOPSY   . HIP PINNING,CANNULATED Left 01/15/2015  Procedure: CANNULATED HIP PINNING/LEFT;  Surgeon: Renette Butters, MD;  Location: [facility];  Service: Orthopedics;  Laterality: Left; Marland Kitchen VEIN SURGERY   HPI: Pt is an 86 y.o. female, with past medical history of diabetes mellitus,  GERD, hypertension, hypothyroidism, pancreatitis, who presented to the ED as code stroke due to difficulty with speech and right-sided weakness. CT head was negative for acute changes. CT angiogram head and neck negative for large vessel occlusion, intracranial atherosclerosis and severe bilateral PCA stenosis. MRI brain 9/5: Acute infarct of the left pons. [facility] Swallow Screening was failed.  No data recorded Assessment / Plan / Recommendation CHL IP CLINICAL IMPRESSIONS 10/18/2019 Clinical Impression Impairments orally include reduced cohesion, control and propulsion resulting in delayed transit, sublingual and lingual residue and holiding of Dys 2 texture. She was unable to masticate soft cereal bar which was eventually removed. Strength and ROM of laryngeal closure, epiglottic inversion was adequate to obstruct vestibule but mildly delayed allowing thin barium to reach vocal cords without awareness and mild pyriform sinus residue present. Pt is profoundly hard of hearing and was unable (during study) to read instructions to clear her throat. Nectar thick barium is presently recommended, continue puree (Dys 1), crush pills, check oral cavity for pocketing and intermittent throat clear.     SLP Visit Diagnosis Dysphagia, oropharyngeal phase (  R13.12) Attention and concentration deficit following -- Frontal lobe and executive function deficit following -- Impact on safety and function Mild aspiration risk;Moderate aspiration risk   CHL IP TREATMENT RECOMMENDATION 10/18/2019 Treatment Recommendations Therapy as outlined in treatment plan below   Prognosis 10/18/2019 Prognosis for Safe Diet Advancement Good Barriers to Reach Goals Severity of deficits Barriers/Prognosis Comment -- CHL IP DIET RECOMMENDATION 10/18/2019 SLP Diet Recommendations Dysphagia 1 (Puree) solids;Nectar thick liquid Liquid Administration via Cup;Straw Medication Administration Crushed with puree Compensations Slow rate;Small sips/bites;Lingual sweep for  clearance of pocketing;Clear throat intermittently Postural Changes Seated upright at 90 degrees   CHL IP OTHER RECOMMENDATIONS 10/18/2019 Recommended Consults -- Oral Care Recommendations Oral care BID Other Recommendations Order thickener from pharmacy   CHL IP FOLLOW UP RECOMMENDATIONS 10/18/2019 Follow up Recommendations Skilled Nursing facility   [facility] IP FREQUENCY AND DURATION 10/18/2019 Speech Therapy Frequency (ACUTE ONLY) min 2x/week Treatment Duration 2 weeks      CHL IP ORAL PHASE 10/18/2019 Oral Phase Impaired Oral - Pudding Teaspoon -- Oral - Pudding Cup -- Oral - Honey Teaspoon -- Oral - Honey Cup -- Oral - Nectar Teaspoon Lingual/palatal residue;Decreased bolus cohesion Oral - Nectar Cup Lingual/palatal residue;Decreased bolus cohesion;Reduced posterior propulsion;Right anterior bolus loss Oral - Nectar Straw -- Oral - Thin Teaspoon Decreased bolus cohesion;Other (Comment) Oral - Thin Cup Decreased bolus cohesion;Delayed oral transit;Right anterior bolus loss Oral - Thin Straw Decreased bolus cohesion Oral - Puree Delayed oral transit;Reduced posterior propulsion Oral - Mech Soft Holding of bolus;Other (Comment) Oral - Regular -- Oral - Multi-Consistency -- Oral - Pill -- Oral Phase - Comment --  CHL IP PHARYNGEAL PHASE 10/18/2019 Pharyngeal Phase Impaired Pharyngeal- Pudding Teaspoon -- Pharyngeal -- Pharyngeal- Pudding Cup -- Pharyngeal -- Pharyngeal- Honey Teaspoon -- Pharyngeal -- Pharyngeal- Honey Cup -- Pharyngeal -- Pharyngeal- Nectar Teaspoon Delayed swallow initiation-pyriform sinuses;Pharyngeal residue - pyriform;Pharyngeal residue - valleculae Pharyngeal -- Pharyngeal- Nectar Cup Pharyngeal residue - pyriform;Pharyngeal residue - valleculae Pharyngeal -- Pharyngeal- Nectar Straw -- Pharyngeal -- Pharyngeal- Thin Teaspoon Pharyngeal residue - pyriform;Delayed swallow initiation-vallecula Pharyngeal -- Pharyngeal- Thin Cup Penetration/Aspiration during swallow;Pharyngeal residue - pyriform Pharyngeal  Material enters airway, CONTACTS cords and not ejected out Pharyngeal- Thin Straw Pharyngeal residue - pyriform Pharyngeal -- Pharyngeal- Puree (No Data) Pharyngeal -- Pharyngeal- Mechanical Soft -- Pharyngeal -- Pharyngeal- Regular -- Pharyngeal -- Pharyngeal- Multi-consistency -- Pharyngeal -- Pharyngeal- Pill -- Pharyngeal -- Pharyngeal Comment --  CHL IP CERVICAL ESOPHAGEAL PHASE 10/18/2019 Cervical Esophageal Phase (No Data) Pudding Teaspoon -- Pudding Cup -- Honey Teaspoon -- Honey Cup -- Nectar Teaspoon -- Nectar Cup -- Nectar Straw -- Thin Teaspoon -- Thin Cup -- Thin Straw -- Puree -- Mechanical Soft -- Regular -- Multi-consistency -- Pill -- Cervical Esophageal Comment -- [place] Siren 10/18/2019, 5:03 PM Orbie Pyo Litaker M.Ed Actor Pager [phone] Office [phone]              ECHOCARDIOGRAM COMPLETE  Result Date: 10/15/2019    ECHOCARDIOGRAM REPORT   Patient Name:   ROYANNE WARSHAW Date of Exam: 10/15/2019 Medical Rec #:  [id number]     Height:       65.0 in Accession #:    [id number]    Weight:       135.0 lb Date of Birth:  [birth year]    BSA:          1.674 m Patient Age:    82 years      BP:  125/62 mmHg Patient Gender: F             HR:           75 bpm. Exam Location:  [facility] Procedure: 2D Echo, Cardiac Doppler and Color Doppler Indications:    Stroke 434.91 / I163.9  History:        Patient has no prior history of Echocardiogram examinations.                 Stroke; Risk Factors:Dyslipidemia and Diabetes.  Sonographer:    Alvino Chapel RCS Referring Phys: [place]  1. Left ventricular ejection fraction, by estimation, is 60 to 65%. The left ventricle has normal function. The left ventricle has no regional wall motion abnormalities. Left ventricular diastolic parameters were normal.  2. Right ventricular systolic function is normal. The right ventricular size is normal. Tricuspid regurgitation signal is inadequate for assessing  PA pressure.  3. The mitral valve is grossly normal. Trivial mitral valve regurgitation. No evidence of mitral stenosis.  4. The aortic valve is tricuspid. Aortic valve regurgitation is not visualized. Mild aortic valve sclerosis is present, with no evidence of aortic valve stenosis.  5. The inferior vena cava is normal in size with greater than 50% respiratory variability, suggesting right atrial pressure of 3 mmHg. Comparison(s): No prior Echocardiogram. Conclusion(s)/Recommendation(s): No intracardiac source of embolism seen. FINDINGS  Left Ventricle: Left ventricular ejection fraction, by estimation, is 60 to 65%. The left ventricle has normal function. The left ventricle has no regional wall motion abnormalities. Definity contrast agent was given IV to delineate the left ventricular  endocardial borders. The left ventricular internal cavity size was normal in size. There is no left ventricular hypertrophy. Left ventricular diastolic parameters were normal. Right Ventricle: The right ventricular size is normal. No increase in right ventricular wall thickness. Right ventricular systolic function is normal. Tricuspid regurgitation signal is inadequate for assessing PA pressure. Left Atrium: Left atrial size was normal in size. Right Atrium: Right atrial size was normal in size. Pericardium: There is no evidence of pericardial effusion. Mitral Valve: The mitral valve is grossly normal. Trivial mitral valve regurgitation. No evidence of mitral valve stenosis. Tricuspid Valve: The tricuspid valve is normal in structure. Tricuspid valve regurgitation is trivial. No evidence of tricuspid stenosis. Aortic Valve: The aortic valve is tricuspid. Aortic valve regurgitation is not visualized. Mild aortic valve sclerosis is present, with no evidence of aortic valve stenosis. Mild aortic valve annular calcification. There is mild calcification of the aortic valve. Pulmonic Valve: The pulmonic valve was not well visualized.  Pulmonic valve regurgitation is not visualized. No evidence of pulmonic stenosis. Aorta: The aortic root is normal in size and structure, the aortic arch was not well visualized and the ascending aorta was not well visualized. Venous: The inferior vena cava is normal in size with greater than 50% respiratory variability, suggesting right atrial pressure of 3 mmHg. IAS/Shunts: The atrial septum is grossly normal.  LEFT VENTRICLE PLAX 2D LVIDd:         3.59 cm  Diastology LVIDs:         2.42 cm  LV e' lateral:   9.46 cm/s LV PW:         0.97 cm  LV E/e' lateral: 6.0 LV IVS:        0.98 cm  LV e' medial:    4.24 cm/s LVOT diam:     1.90 cm  LV E/e' medial:  13.3 LV SV:  90 LV SV Index:   54 LVOT Area:     2.84 cm  RIGHT VENTRICLE RV S prime:     12.20 cm/s TAPSE (M-mode): 1.8 cm LEFT ATRIUM             Index LA diam:        3.40 cm 2.03 cm/m LA Vol (A2C):   32.3 ml 19.30 ml/m LA Vol (A4C):   42.1 ml 25.15 ml/m LA Biplane Vol: 36.9 ml 22.05 ml/m  AORTIC VALVE LVOT Vmax:   150.00 cm/s LVOT Vmean:  119.000 cm/s LVOT VTI:    0.317 m  AORTA Ao Root diam: 3.20 cm MITRAL VALVE MV Area (PHT): 1.29 cm    SHUNTS MV Decel Time: 588 msec    Systemic VTI:  0.32 m MV E velocity: 56.60 cm/s  Systemic Diam: 1.90 cm MV A velocity: 97.00 cm/s MV E/A ratio:  0.58 Buford Dresser MD Electronically signed by Buford Dresser MD Signature Date/Time: 10/15/2019/1:32:37 PM    Final    CT HEAD CODE STROKE WO CONTRAST  Result Date: 10/14/2019 CLINICAL DATA:  Code stroke.  Right facial droop and slurred speech. EXAM: CT HEAD WITHOUT CONTRAST TECHNIQUE: Contiguous axial images were obtained from the base of the skull through the vertex without intravenous contrast. COMPARISON:  04/25/2018 FINDINGS: Brain: There is no evidence of an acute infarct, intracranial hemorrhage, mass, midline shift, or extra-axial fluid collection. Mild cerebral atrophy is unchanged. Hypodensities in the cerebral white matter bilaterally are also  unchanged and nonspecific but compatible with mild chronic small vessel ischemic disease. Vascular: Calcified atherosclerosis at the skull base. No hyperdense vessel. Skull: No fracture or suspicious osseous lesion. Sinuses/Orbits: Clear paranasal sinuses and right mastoid air cells. Unchanged large left mastoid effusion. Bilateral cataract extraction. Other: None. ASPECTS [facility] Stroke Program Early CT Score) - Ganglionic level infarction (caudate, lentiform nuclei, internal capsule, insula, M1-M3 cortex): 7 - Supraganglionic infarction (M4-M6 cortex): 3 Total score (0-10 with 10 being normal): 10 IMPRESSION: 1. No evidence of acute intracranial abnormality. 2. ASPECTS is 10. 3. Mild chronic small vessel ischemic disease. 4. Unchanged large left mastoid effusion. These results were called by telephone at the time of interpretation on 10/14/2019 at 6:36 pm to Dr. Fredia Sorrow, who verbally acknowledged these results. Electronically Signed   By: Logan Bores M.D.   On: 10/14/2019 18:37   CT ANGIO HEAD CODE STROKE  Result Date: 10/14/2019 CLINICAL DATA:  Right facial droop and slurred speech. EXAM: CT ANGIOGRAPHY HEAD AND NECK CT PERFUSION BRAIN TECHNIQUE: Multidetector CT imaging of the head and neck was performed using the standard protocol during bolus administration of intravenous contrast. Multiplanar CT image reconstructions and MIPs were obtained to evaluate the vascular anatomy. Carotid stenosis measurements (when applicable) are obtained utilizing NASCET criteria, using the distal internal carotid diameter as the denominator. Multiphase CT imaging of the brain was performed following IV bolus contrast injection. Subsequent parametric perfusion maps were calculated using RAPID software. CONTRAST:  166mL OMNIPAQUE IOHEXOL 350 MG/ML SOLN COMPARISON:  None. FINDINGS: CTA NECK FINDINGS Aortic arch: Normal variant aortic arch branching pattern with common origin of the brachiocephalic and left common carotid  arteries. Mild aortic arch atherosclerosis without significant arch vessel origin stenosis. Right carotid system: Patent with minimal nonstenotic calcified plaque at the carotid bifurcation. Mildly ectatic appearance of the distal cervical ICA. Left carotid system: Patent with mild nonstenotic predominantly calcified plaque in the proximal ICA. Mildly ectatic appearance of the distal cervical ICA. Vertebral arteries: Patent without  evidence of stenosis or dissection. Moderately dominant left vertebral artery. Skeleton: Moderately advanced cervical facet arthrosis with grade 1 anterolisthesis of C6 on C7 and C5 on C6. Left facet ankylosis at C5-6. Mild cervical disc degeneration. Other neck: No evidence of cervical lymphadenopathy or mass. Upper chest: Minimal dependent atelectasis in the superior segments of the lower lobes. Review of the MIP images confirms the above findings CTA HEAD FINDINGS Anterior circulation: The internal carotid arteries are patent from skull base to carotid termini with calcified plaque resulting in up to mild supraclinoid stenosis bilaterally. ACAs and MCAs are patent without evidence of a proximal branch occlusion or significant proximal stenosis. No aneurysm is identified. Posterior circulation: The intracranial vertebral arteries are patent to the basilar. Left V4 segment plaque results in luminal irregularity and up to mild stenosis. Patent PICA and SCA origins are identified bilaterally. The basilar artery is patent and mildly irregular without significant stenosis. Posterior communicating arteries are diminutive or absent. The PCAs are patent with severe stenoses noted at the left P1-P2 junction and in the proximal right P3 segment. No aneurysm is identified. Venous sinuses: Patent. Anatomic variants: None. Review of the MIP images confirms the above findings CT Brain Perfusion Findings: ASPECTS: 10 CBF (<30%) Volume: 0 mL Perfusion (Tmax>6.0s) volume: 34mL, located along the floor of  the left anterior cranial fossa and considered artifactual Mismatch Volume: n/a Infarction Location: None evident by CTP IMPRESSION: 1. No emergent large vessel occlusion. 2. Intracranial atherosclerosis with severe bilateral PCA stenoses. No significant proximal anterior circulation stenosis. 3. Widely patent cervical carotid and vertebral arteries. 4. Negative CTP. 5. Aortic Atherosclerosis (ICD10-I70.0). Electronically Signed   By: Logan Bores M.D.   On: 10/14/2019 19:37   CT ANGIO NECK CODE STROKE  Result Date: 10/14/2019 CLINICAL DATA:  Right facial droop and slurred speech. EXAM: CT ANGIOGRAPHY HEAD AND NECK CT PERFUSION BRAIN TECHNIQUE: Multidetector CT imaging of the head and neck was performed using the standard protocol during bolus administration of intravenous contrast. Multiplanar CT image reconstructions and MIPs were obtained to evaluate the vascular anatomy. Carotid stenosis measurements (when applicable) are obtained utilizing NASCET criteria, using the distal internal carotid diameter as the denominator. Multiphase CT imaging of the brain was performed following IV bolus contrast injection. Subsequent parametric perfusion maps were calculated using RAPID software. CONTRAST:  19mL OMNIPAQUE IOHEXOL 350 MG/ML SOLN COMPARISON:  None. FINDINGS: CTA NECK FINDINGS Aortic arch: Normal variant aortic arch branching pattern with common origin of the brachiocephalic and left common carotid arteries. Mild aortic arch atherosclerosis without significant arch vessel origin stenosis. Right carotid system: Patent with minimal nonstenotic calcified plaque at the carotid bifurcation. Mildly ectatic appearance of the distal cervical ICA. Left carotid system: Patent with mild nonstenotic predominantly calcified plaque in the proximal ICA. Mildly ectatic appearance of the distal cervical ICA. Vertebral arteries: Patent without evidence of stenosis or dissection. Moderately dominant left vertebral artery.  Skeleton: Moderately advanced cervical facet arthrosis with grade 1 anterolisthesis of C6 on C7 and C5 on C6. Left facet ankylosis at C5-6. Mild cervical disc degeneration. Other neck: No evidence of cervical lymphadenopathy or mass. Upper chest: Minimal dependent atelectasis in the superior segments of the lower lobes. Review of the MIP images confirms the above findings CTA HEAD FINDINGS Anterior circulation: The internal carotid arteries are patent from skull base to carotid termini with calcified plaque resulting in up to mild supraclinoid stenosis bilaterally. ACAs and MCAs are patent without evidence of a proximal branch occlusion or significant proximal  stenosis. No aneurysm is identified. Posterior circulation: The intracranial vertebral arteries are patent to the basilar. Left V4 segment plaque results in luminal irregularity and up to mild stenosis. Patent PICA and SCA origins are identified bilaterally. The basilar artery is patent and mildly irregular without significant stenosis. Posterior communicating arteries are diminutive or absent. The PCAs are patent with severe stenoses noted at the left P1-P2 junction and in the proximal right P3 segment. No aneurysm is identified. Venous sinuses: Patent. Anatomic variants: None. Review of the MIP images confirms the above findings CT Brain Perfusion Findings: ASPECTS: 10 CBF (<30%) Volume: 0 mL Perfusion (Tmax>6.0s) volume: 58mL, located along the floor of the left anterior cranial fossa and considered artifactual Mismatch Volume: n/a Infarction Location: None evident by CTP IMPRESSION: 1. No emergent large vessel occlusion. 2. Intracranial atherosclerosis with severe bilateral PCA stenoses. No significant proximal anterior circulation stenosis. 3. Widely patent cervical carotid and vertebral arteries. 4. Negative CTP. 5. Aortic Atherosclerosis (ICD10-I70.0). Electronically Signed   By: Logan Bores M.D.   On: 10/14/2019 19:37     Subjective:  No new  complaints.   Discharge Exam: Vitals:   10/18/19 1900 10/30/2019 0716  BP: (!) 136/56 (!) 144/65  Pulse: 94 77  Resp: 18 20  Temp: 98.5 F (36.9 C) 98.6 F (37 C)  SpO2: 95% 100%   Vitals:   10/18/19 1635 10/18/19 1639 10/18/19 1900 11/10/2019 0716  BP:  (!) 131/54 (!) 136/56 (!) 144/65  Pulse: 100 (!) 101 94 77  Resp:  18 18 20   Temp:  97.8 F (36.6 C) 98.5 F (36.9 C) 98.6 F (37 C)  TempSrc:   Oral   SpO2: 90% 94% 95% 100%  Weight:      Height:        General: Pt is alert, awake, not in acute distress Cardiovascular: RRR, S1/S2 +, no rubs, no gallops Respiratory: CTA bilaterally, no wheezing, no rhonchi Abdominal: Soft, NT, ND, bowel sounds + Extremities: no edema, no cyanosis    The results of significant diagnostics from this hospitalization (including imaging, microbiology, ancillary and laboratory) are listed below for reference.     Microbiology: Recent Results (from the past 240 hour(s))  SARS Coronavirus 2 by RT PCR (hospital order, performed in [facility] hospital lab) Nasopharyngeal Nasopharyngeal Swab     Status: None   Collection Time: 10/14/19  8:51 PM   Specimen: Nasopharyngeal Swab  Result Value Ref Range Status   SARS Coronavirus 2 NEGATIVE NEGATIVE Final    Comment: (NOTE) SARS-CoV-2 target nucleic acids are NOT DETECTED.  The SARS-CoV-2 RNA is generally detectable in upper and lower respiratory specimens during the acute phase of infection. The lowest concentration of SARS-CoV-2 viral copies this assay can detect is 250 copies / mL. A negative result does not preclude SARS-CoV-2 infection and should not be used as the sole basis for treatment or other patient management decisions.  A negative result may occur with improper specimen collection / handling, submission of specimen other than nasopharyngeal swab, presence of viral mutation(s) within the areas targeted by this assay, and inadequate number of viral copies (<250 copies / mL). A  negative result must be combined with clinical observations, patient history, and epidemiological information.  Fact Sheet for Patients:   StrictlyIdeas.no  Fact Sheet for Healthcare Providers: BankingDealers.co.za  This test is not yet approved or  cleared by the [place] FDA and has been authorized for detection and/or diagnosis of SARS-CoV-2 by FDA under an Emergency Use  Authorization (EUA).  This EUA will remain in effect (meaning this test can be used) for the duration of the COVID-19 declaration under Section 564(b)(1) of the Act, 21 U.S.C. section 360bbb-3(b)(1), unless the authorization is terminated or revoked sooner.  Performed at [facility], [address], [place], [place] [address]      Labs: BNP (last 3 results) No results for input(s): BNP in the last 8760 hours. Basic Metabolic Panel: Recent Labs  Lab 10/14/19 1827 10/14/19 1853 10/17/19 0751 10/18/19 0534 10/13/2019 0139  NA 134* 132* 137 135 136  K 4.6 3.7 3.0* 2.8* 3.2*  CL 101 100 106 105 108  CO2  --  20* 20* 20* 20*  GLUCOSE 158* 160* 98 130* 115*  BUN 15 14 7* 7* 6*  CREATININE 0.60 0.59 0.56 0.50 0.52  CALCIUM  --  9.1 8.0* 8.1* 8.1*  MG  --  1.9  --   --   --    Liver Function Tests: Recent Labs  Lab 10/14/19 1853 10/17/19 0751  AST 24 22  ALT 16 17  ALKPHOS 74 63  BILITOT 0.6 1.0  PROT 7.1 5.5*  ALBUMIN 4.1 2.7*   No results for input(s): LIPASE, AMYLASE in the last 168 hours. No results for input(s): AMMONIA in the last 168 hours. CBC: Recent Labs  Lab 10/14/19 1823 10/14/19 1827 10/17/19 0751  WBC 8.6  --  8.2  NEUTROABS 6.3  --   --   HGB 13.8 13.9 11.9*  HCT 40.5 41.0 36.9  MCV 90.8  --  93.4  PLT 252  --  188   Cardiac Enzymes: No results for input(s): CKTOTAL, CKMB, CKMBINDEX, TROPONINI in the last 168 hours. BNP: Invalid input(s): POCBNP CBG: Recent Labs  Lab 10/18/19 1638 10/18/19 2034 10/18/19 2313  10/23/2019 0309 10/18/2019 0716  GLUCAP 148* 146* 115* 118* 106*   D-Dimer No results for input(s): DDIMER in the last 72 hours. Hgb A1c No results for input(s): HGBA1C in the last 72 hours. Lipid Profile No results for input(s): CHOL, HDL, LDLCALC, TRIG, CHOLHDL, LDLDIRECT in the last 72 hours. Thyroid function studies No results for input(s): TSH, T4TOTAL, T3FREE, THYROIDAB in the last 72 hours.  Invalid input(s): FREET3 Anemia work up No results for input(s): VITAMINB12, FOLATE, FERRITIN, TIBC, IRON, RETICCTPCT in the last 72 hours. Urinalysis    Component Value Date/Time   COLORURINE STRAW (A) 10/14/2019 1818   APPEARANCEUR CLEAR 10/14/2019 1818   LABSPEC 1.034 (H) 10/14/2019 1818   PHURINE 7.0 10/14/2019 1818   GLUCOSEU NEGATIVE 10/14/2019 1818   HGBUR SMALL (A) 10/14/2019 1818   BILIRUBINUR NEGATIVE 10/14/2019 1818   KETONESUR NEGATIVE 10/14/2019 1818   PROTEINUR NEGATIVE 10/14/2019 1818   UROBILINOGEN 0.2 03/29/2013 1332   NITRITE NEGATIVE 10/14/2019 1818   LEUKOCYTESUR MODERATE (A) 10/14/2019 1818   Sepsis Labs Invalid input(s): PROCALCITONIN,  WBC,  LACTICIDVEN Microbiology Recent Results (from the past 240 hour(s))  SARS Coronavirus 2 by RT PCR (hospital order, performed in [place] hospital lab) Nasopharyngeal Nasopharyngeal Swab     Status: None   Collection Time: 10/14/19  8:51 PM   Specimen: Nasopharyngeal Swab  Result Value Ref Range Status   SARS Coronavirus 2 NEGATIVE NEGATIVE Final    Comment: (NOTE) SARS-CoV-2 target nucleic acids are NOT DETECTED.  The SARS-CoV-2 RNA is generally detectable in upper and lower respiratory specimens during the acute phase of infection. The lowest concentration of SARS-CoV-2 viral copies this assay can detect is 250 copies / mL. A  negative result does not preclude SARS-CoV-2 infection and should not be used as the sole basis for treatment or other patient management decisions.  A negative result may occur  with improper specimen collection / handling, submission of specimen other than nasopharyngeal swab, presence of viral mutation(s) within the areas targeted by this assay, and inadequate number of viral copies (<250 copies / mL). A negative result must be combined with clinical observations, patient history, and epidemiological information.  Fact Sheet for Patients:   StrictlyIdeas.no  Fact Sheet for Healthcare Providers: BankingDealers.co.za  This test is not yet approved or  cleared by the [place] FDA and has been authorized for detection and/or diagnosis of SARS-CoV-2 by FDA under an Emergency Use Authorization (EUA).  This EUA will remain in effect (meaning this test can be used) for the duration of the COVID-19 declaration under Section 564(b)(1) of the Act, 21 U.S.C. section 360bbb-3(b)(1), unless the authorization is terminated or revoked sooner.  Performed at [facility], [address] [place], [place] [address]      Time coordinating discharge: 31 minutes.   SIGNED:   Hosie Poisson, MD  [facility] 11/06/2019, 9:23 AM

## 2019-10-19 NOTE — Progress Notes (Signed)
Report called to [place] at [facility].

## 2019-10-20 ENCOUNTER — Encounter: Payer: Self-pay | Admitting: Adult Health

## 2019-10-20 ENCOUNTER — Non-Acute Institutional Stay (SKILLED_NURSING_FACILITY): Payer: PPO | Admitting: Adult Health

## 2019-10-20 DIAGNOSIS — E1149 Type 2 diabetes mellitus with other diabetic neurological complication: Secondary | ICD-10-CM

## 2019-10-20 DIAGNOSIS — H401134 Primary open-angle glaucoma, bilateral, indeterminate stage: Secondary | ICD-10-CM

## 2019-10-20 DIAGNOSIS — G8191 Hemiplegia, unspecified affecting right dominant side: Secondary | ICD-10-CM | POA: Diagnosis not present

## 2019-10-20 DIAGNOSIS — E1169 Type 2 diabetes mellitus with other specified complication: Secondary | ICD-10-CM

## 2019-10-20 DIAGNOSIS — I10 Essential (primary) hypertension: Secondary | ICD-10-CM

## 2019-10-20 DIAGNOSIS — I152 Hypertension secondary to endocrine disorders: Secondary | ICD-10-CM

## 2019-10-20 DIAGNOSIS — E876 Hypokalemia: Secondary | ICD-10-CM | POA: Diagnosis not present

## 2019-10-20 DIAGNOSIS — K861 Other chronic pancreatitis: Secondary | ICD-10-CM | POA: Diagnosis not present

## 2019-10-20 DIAGNOSIS — J3089 Other allergic rhinitis: Secondary | ICD-10-CM | POA: Diagnosis not present

## 2019-10-20 DIAGNOSIS — E785 Hyperlipidemia, unspecified: Secondary | ICD-10-CM

## 2019-10-20 DIAGNOSIS — I69391 Dysphagia following cerebral infarction: Secondary | ICD-10-CM

## 2019-10-20 DIAGNOSIS — I639 Cerebral infarction, unspecified: Secondary | ICD-10-CM

## 2019-10-20 DIAGNOSIS — E039 Hypothyroidism, unspecified: Secondary | ICD-10-CM

## 2019-10-20 DIAGNOSIS — K219 Gastro-esophageal reflux disease without esophagitis: Secondary | ICD-10-CM | POA: Diagnosis not present

## 2019-10-20 DIAGNOSIS — E1159 Type 2 diabetes mellitus with other circulatory complications: Secondary | ICD-10-CM | POA: Diagnosis not present

## 2019-10-20 NOTE — Progress Notes (Signed)
Location:    [place] Room Number: 128/P Place of Service:  SNF (31)   CODE STATUS: DNR  Allergies  Allergen Reactions   Sulfamethoxazole-Trimethoprim Rash   Alendronate     Unknown reaction   Cephalexin Other (See Comments)    Unknown reaction Unknown reaction Unknown reaction   Clindamycin/Lincomycin Hives   Creon [Pancreatin] Hives   Doxycycline Other (See Comments)    Unknown reaction Unknown reaction   Florastor [Yeast] Hives   Penicillins Other (See Comments)    Has patient had a PCN reaction causing immediate rash, facial/tongue/throat swelling, SOB or lightheadedness with hypotension: unknown Has patient had a PCN reaction causing severe rash involving mucus membranes or skin necrosis: unknown Has patient had a PCN reaction that required hospitalization unknown Has patient had a PCN reaction occurring within the last 10 years:unknown If all of the above answers are "NO", then may proceed with Cephalosporin use.   Zenpep [Pancrelipase (Lip-Prot-Amyl)] Hives    Chief Complaint  Patient presents with   Hospitalization Follow-up    Hospitalization Follow Up    HPI:  She is a 86 year old woman who has been hospitalized from 10-14-19 through 11/05/2019. She has been hospitalized due to an acute cva. She is here for short term rehab. More than likely this does represent a long term placement for her. There are no reports of uncontrolled pain. No reports of heart burn; no reports of aspiration; no reports of anxiety or agitation. She will continue to be followed for her chronic illnesses including: cva; diabetes hypertension.   Past Medical History:  Diagnosis Date   Arthritis    Diabetes mellitus without complication ([place])    "borderline"   Diverticulosis of colon    High cholesterol    Hypertension    Hypothyroidism    Pancreatitis     Past Surgical History:  Procedure Laterality Date   ABDOMINAL HYSTERECTOMY     partial     APPENDECTOMY     bladder tac     CHOLECYSTECTOMY  1999   cholelithiasis   COLONOSCOPY  01/2007   Dr Olevia Perches: normal   COLONOSCOPY  12/2000   Dr Deatra Ina- diverticulosis   ESOPHAGOGASTRODUODENOSCOPY     Dr Damaris Hippo cannot remember   EUS N/A 03/24/2012   Dr. Paulita Fujita: suspected passed CBD stone as cause of acute pancreatitis, suspective chronic pancreatitis.    EXCISION OF BREAST BIOPSY     HIP PINNING,CANNULATED Left 01/15/2015   Procedure: CANNULATED HIP PINNING/LEFT;  Surgeon: Renette Butters, MD;  Location: [place];  Service: Orthopedics;  Laterality: Left;   VEIN SURGERY      Social History   Socioeconomic History   Marital status: Married    Spouse name: Not on file   Number of children: 1   Years of education: Not on file   Highest education level: Not on file  Occupational History   Occupation: retired, tobacco field  Tobacco Use   Smoking status: Never Smoker   Smokeless tobacco: Never Used  Substance and Sexual Activity   Alcohol use: No    Alcohol/week: 0.0 standard drinks   Drug use: No   Sexual activity: Not on file  Other Topics Concern   Not on file  Social History Narrative   Lost 1 son age 82-?MI   1 living son         Social Determinants of Health   Financial Resource Strain:    Difficulty of Paying Living Expenses: Not on  file  Food Insecurity:    Worried About Charity fundraiser in the Last Year: Not on file   [employer] of Food in the Last Year: Not on file  Transportation Needs:    Lack of Transportation (Medical): Not on file   Lack of Transportation (Non-Medical): Not on file  Physical Activity:    Days of Exercise per Week: Not on file   Minutes of Exercise per Session: Not on file  Stress:    Feeling of Stress : Not on file  Social Connections:    Frequency of Communication with Friends and Family: Not on file   Frequency of Social Gatherings with Friends and Family: Not on file   Attends Religious  Services: Not on file   Active Member of [employer] or Organizations: Not on file   Attends Archivist Meetings: Not on file   Marital Status: Not on file  Intimate Partner Violence:    Fear of Current or Ex-Partner: Not on file   Emotionally Abused: Not on file   Physically Abused: Not on file   Sexually Abused: Not on file   Family History  Problem Relation Age of Onset   Diabetes Father    Diabetes Mother    Cancer Mother        ovarian   Cancer Sister 107       breast   Cancer Sister 56       bone      VITAL SIGNS BP (!) 130/98    Pulse (!) 102    Temp 98 F (36.7 C) (Oral)    Resp 20    Ht 5\' 2"  (1.575 m)    Wt 143 lb 12.8 oz (65.2 kg)    BMI 26.30 kg/m   Outpatient Encounter Medications as of 10/20/2019  Medication Sig   amLODipine (NORVASC) 5 MG tablet Take 5 mg by mouth every morning.    aspirin EC 81 MG tablet Take 1 tablet (81 mg total) by mouth at bedtime for 21 days.   atorvastatin (LIPITOR) 40 MG tablet Take 1 tablet (40 mg total) by mouth daily.   carboxymethylcellulose (REFRESH PLUS) 0.5 % SOLN Place 1 drop into both eyes 3 (three) times daily as needed (for dry eyes).    clopidogrel (PLAVIX) 75 MG tablet Take 1 tablet (75 mg total) by mouth daily.   famotidine (PEPCID) 40 MG tablet Take 40 mg by mouth 2 (two) times daily.   fexofenadine (ALLEGRA) 180 MG tablet Take 180 mg by mouth daily.   hydrOXYzine (ATARAX/VISTARIL) 25 MG tablet Take 25 mg by mouth every 8 (eight) hours as needed for itching.    latanoprost (XALATAN) 0.005 % ophthalmic solution Place 1 drop into both eyes at bedtime.   levothyroxine (SYNTHROID) 88 MCG tablet Take 1 tablet by mouth daily.   lisinopril (PRINIVIL,ZESTRIL) 2.5 MG tablet Take 2.5 mg by mouth daily.    metFORMIN (GLUCOPHAGE-XR) 500 MG 24 hr tablet Take 500 mg by mouth daily with breakfast.    montelukast (SINGULAIR) 10 MG tablet Take 10 mg by mouth daily.   Multiple Vitamin (MULTIVITAMIN WITH  MINERALS) TABS tablet Take 1 tablet by mouth daily.   NON FORMULARY NAS Dysphagia 1 diet with nectar thick liquids   Omega-3 Fatty Acids (FISH OIL) 1000 MG CAPS Take 1 capsule by mouth daily.    potassium chloride SA (K-DUR,KLOR-CON) 20 MEQ tablet Take 20 mEq by mouth daily.   [DISCONTINUED] Maltodextrin-Xanthan Gum (RESOURCE THICKENUP CLEAR) POWD Use as  needed.   No facility-administered encounter medications on file as of 10/20/2019.     SIGNIFICANT DIAGNOSTIC EXAMS  TODAY   10-14-19: ct of head:  1. No evidence of acute intracranial abnormality. 2. ASPECTS is 10. 3. Mild chronic small vessel ischemic disease. 4. Unchanged large left mastoid effusion.  10-14-19: ct angio of head and neck:  1. No emergent large vessel occlusion. 2. Intracranial atherosclerosis with severe bilateral PCA stenoses. No significant proximal anterior circulation stenosis. 3. Widely patent cervical carotid and vertebral arteries. 4. Negative CTP. 5. Aortic Atherosclerosis   10-15-19: 2-d echo:  Left ventricular ejection fraction, by estimation, is 60 to 65%. The  left ventricle has normal function. The left ventricle has no regional  wall motion abnormalities. Left ventricular diastolic parameters were  normal.   10-16-19: MRI of brain:  1. Acute infarct of the left pons. No hemorrhage or mass effect. 2. Generalized atrophy and findings of chronic small vessel disease.   10-18-19: swallow study: Dysphagia 1 (Puree) solids;Nectar thick liquid    LABS REVIEWED TODAY  10-14-19: wbc 8.6; hgb 13.8; hct 40.5; mcv 90.8 plt 252 glucose 160; bun 14; creat 0.59; k+ 3.7; na++ 132; ca 9.1 liver normal albumin 4.1 vit B 12: 744 free t4: 1.00 hgb a1c 5.8; chol 203; ldl 134; trig 66; hdl 56 10-17-19: wbc 8.2; hgb 11.9; hct 36.9; mcv 93.4 plt 188 glucose 98; bun 7; creat 0.56; k+ 3.0; na++ 137; ca 8.0 liver normal albumin 2.7 10/13/2019: glucose 115; bun 6; creat 0.52; k+ 3.2; na++ 136; ca 8.1    Review of Systems  Unable to  perform ROS: Medical condition (aphasia )    Physical Exam Constitutional:      General: She is not in acute distress.    Appearance: She is well-developed. She is not diaphoretic.  Neck:     Thyroid: No thyromegaly.  Cardiovascular:     Rate and Rhythm: Normal rate and regular rhythm.     Pulses: Normal pulses.     Heart sounds: Normal heart sounds.  Pulmonary:     Effort: Pulmonary effort is normal. No respiratory distress.     Breath sounds: Normal breath sounds.  Abdominal:     General: Bowel sounds are normal. There is no distension.     Palpations: Abdomen is soft.     Tenderness: There is no abdominal tenderness.  Musculoskeletal:     Cervical back: Neck supple.     Right lower leg: No edema.     Left lower leg: No edema.     Comments: Right hemiplegia   Lymphadenopathy:     Cervical: No cervical adenopathy.  Skin:    General: Skin is warm and dry.  Neurological:     Mental Status: She is alert. Mental status is at baseline.  Psychiatric:        Mood and Affect: Mood normal.       ASSESSMENT/ PLAN:  TODAY  1.  Acute CVA (cerebrovascular accident/right hemiplegia: is neurological stable: will continue asa 81 mg daily plavix 75 mg daily   2. Dysphagia due to recent cerebrovascular accident (CVA): is without signs of aspiration present; will continue nectar thick liquids.   3. Hypertension associated with type 2 diabetes mellitus: is stable b/p 130/98 will continue norvasc 5 mg daily lisinopril 2.5 mg daily   4. Chronic non-seasonal allergic rhinitis: is stable will continue allegra 180 mg daily singulair 10 mg daily  5. GERD without esophagitis: is stable will continue pepcid 40  mg twice daily   6. Chronic pancreatitis unspecified pancreatitis type: is stable no recent flares will monitor  7. Acquired hypothyroidism: is stable free T4: 1.00 will continue synthroid 88 mcg daily   8. Hyperlipidemia associated with type 2 diabetes mellitus: is stable LDL 134  will continue lipitor 40 mg daily fish oil daily   9. Type 2 diabetes with neurologic complications: is stable hgb a1c 5.8 will continue metformin xr 500 mg daily is on asa statin and ace  10. Chronic open angle glaucoma of both eyes undetermined stage: is stable will continue xalatan to both eyes   11. Hypokalemia: k+ 3.2 will continue k+ 20 meq daily   Will check bmp 10-24-19.      MD is aware of resident's narcotic use and is in agreement with current plan of care. We will attempt to wean resident as appropriate.  Ok Edwards NP [facility]  Contact [phone] Monday through Friday 8am- 5pm  After hours call [phone]

## 2019-10-21 ENCOUNTER — Non-Acute Institutional Stay (SKILLED_NURSING_FACILITY): Payer: PPO | Admitting: Internal Medicine

## 2019-10-21 ENCOUNTER — Encounter: Payer: Self-pay | Admitting: Internal Medicine

## 2019-10-21 DIAGNOSIS — I69391 Dysphagia following cerebral infarction: Secondary | ICD-10-CM

## 2019-10-21 DIAGNOSIS — E1149 Type 2 diabetes mellitus with other diabetic neurological complication: Secondary | ICD-10-CM

## 2019-10-21 DIAGNOSIS — I639 Cerebral infarction, unspecified: Secondary | ICD-10-CM | POA: Diagnosis not present

## 2019-10-21 DIAGNOSIS — I10 Essential (primary) hypertension: Secondary | ICD-10-CM

## 2019-10-21 DIAGNOSIS — E039 Hypothyroidism, unspecified: Secondary | ICD-10-CM

## 2019-10-21 DIAGNOSIS — G8191 Hemiplegia, unspecified affecting right dominant side: Secondary | ICD-10-CM | POA: Insufficient documentation

## 2019-10-21 HISTORY — DX: Dysphagia following cerebral infarction: I69.391

## 2019-10-21 NOTE — Patient Instructions (Signed)
See assessment and plan under each diagnosis in the problem list and acutely for this visit 

## 2019-10-21 NOTE — Assessment & Plan Note (Signed)
Initial blood pressure 150/97 was rechecked; repeat blood pressure 104/57.  Continue to monitor and adjust medications based on blood pressure average rather than high or low values.

## 2019-10-21 NOTE — Assessment & Plan Note (Signed)
A1c prediabetic at 5.8%.

## 2019-10-21 NOTE — Progress Notes (Signed)
NURSING HOME LOCATION:  [place] NUMBER:  128/P  CODE STATUS: DNR  PCP:  Aletha Halim., PA-C   This is a comprehensive admission note to [place] performed on this date less than 30 days from date of admission. Included are preadmission medical/surgical history; reconciled medication list; family history; social history and comprehensive review of systems.  Corrections and additions to the records were documented. Comprehensive physical exam was also performed. Additionally a clinical summary was entered for each active diagnosis pertinent to this admission in the Problem List to enhance continuity of care.  HPI: Patient was hospitalized 9/3-09/08/2019, admitted from home as a code stroke.  At approximately  5:15 PM patient was noted to have slurred speech, right-sided weakness prompting granddaughter to call EMS.  CT of the head and neck revealed no large vessel occlusion.  Intracranial atherosclerosis and severe bilateral PCA stenosis present.  MRI revealed acute infarct of the left pons without hemorrhage or mass-effect.  Additionally generalized atrophy and findings of chronic small vessel disease were noted.  Echo revealed no source of emboli.  Ejection fraction 60-65%.  Neurology recommended continuing aspirin and Plavix for 3 weeks followed by Plavix alone.  Amlodipine and Prinivil were placed on hold for permissive hypertension but resumed at discharge.  Statin was initiated for LDL of 134; therapeutic goal would be less than 70.  Diabetes was well controlled as documented with an A1c of 5.8%. TSH was 6.55 suggesting sick euthyroid syndrome.Synthroid dose was not changed but TSH was to be rechecked in 6-8 weeks. Discharged on dysphagia 1 diet with nectar thick liquids. PT recommended placement at the SNF for rehab. Neurology follow-up [facility] in approximately 4 weeks recommended.  Past medical and surgical history: Includes history of pancreatitis,  hypothyroidism, essential hypertension, dyslipidemia, history of diverticulosis, glaucoma and diabetes with vascular complications. Surgeries and procedures include abdominal hysterectomy, cholecystectomy, excisional breast biopsy, hip pinning, and colonoscopies.  Social history: Nondrinker, never smoked.  Family history: Reviewed; noncontributory due to advanced age.   Review of systems:  Could not be completed due to dysarthria and markedly garbled speech.  She seemed to indicate she might be having pain in the left leg of a positional nature.  She also became tearful apparently because she believes she was told she would never walk again.   The nurse who was feeding her stated that she was having some cough after eating.  Physical exam:  Pertinent or positive findings: She appears her stated age.  Hair is thin and disheveled.  She is markedly hard of hearing.  As noted her speech is almost indiscernible as is markedly garbled.  There is wasting of the lower facial muscles.  She tends to have intermittent esotropia of the right eye.  Bilateral ptosis is present.  Slight bronchovesicular character to the breath sounds is noted on the right.  Breath sounds are left are somewhat decreased.  Pedal pulses are decreased, especially dorsalis pedis pulses.  She has interosseous wasting of the hands.  She could not follow commands to oppose my hands to assess strength.  Right hemiparesis is present with flaccidity.  General appearance: no acute distress, increased work of breathing is present.   Lymphatic: No lymphadenopathy about the head, neck, axilla. Eyes: No conjunctival inflammation or lid edema is present. There is no scleral icterus. Ears:  External ear exam shows no significant lesions or deformities.   Nose:  External nasal examination shows no deformity or inflammation. Nasal mucosa are  pink and moist without lesions, exudates Oral exam: Lips and gums are healthy appearing.There is no  oropharyngeal erythema or exudate. Neck:  No thyromegaly, masses, tenderness noted.    Heart:  No gallop, murmur, click, rub.  Lungs:  without wheezes, rhonchi, rales, rubs. Abdomen: Bowel sounds are normal.  Abdomen is soft and nontender with no organomegaly, hernias, masses. GU: Deferred  Extremities:  No cyanosis, clubbing, edema. Neurologic exam:  Balance, Rhomberg, finger to nose testing could not be completed due to clinical state Skin: Warm & dry w/o tenting. No significant lesions or rash.  See clinical summary under each active problem in the Problem List with associated updated therapeutic plan

## 2019-10-21 NOTE — Assessment & Plan Note (Signed)
TSH 6.55 suggesting sick euthyroid syndrome.  No change in Synthroid dose; recheck TSH in 6-8 weeks post rehab.

## 2019-10-21 NOTE — Assessment & Plan Note (Signed)
Neurology follow-up in 4 weeks with [facility]

## 2019-10-24 ENCOUNTER — Encounter: Payer: Self-pay | Admitting: Adult Health

## 2019-10-24 ENCOUNTER — Non-Acute Institutional Stay (SKILLED_NURSING_FACILITY): Payer: PPO | Admitting: Adult Health

## 2019-10-24 ENCOUNTER — Encounter (HOSPITAL_COMMUNITY)
Admission: RE | Admit: 2019-10-24 | Discharge: 2019-10-24 | Disposition: A | Discharge status: Home / Self Care | Payer: PPO | Source: Skilled Nursing Facility | Attending: Adult Health | Admitting: Adult Health

## 2019-10-24 DIAGNOSIS — E876 Hypokalemia: Secondary | ICD-10-CM | POA: Diagnosis not present

## 2019-10-24 DIAGNOSIS — E875 Hyperkalemia: Secondary | ICD-10-CM | POA: Diagnosis not present

## 2019-10-24 DIAGNOSIS — I10 Essential (primary) hypertension: Secondary | ICD-10-CM | POA: Diagnosis not present

## 2019-10-24 LAB — BASIC METABOLIC PANEL
Anion gap: 12 (ref 5–15)
BUN: 21 mg/dL (ref 8–23)
CO2: 26 mmol/L (ref 22–32)
Calcium: 8.9 mg/dL (ref 8.9–10.3)
Chloride: 109 mmol/L (ref 98–111)
Creatinine, Ser: 0.49 mg/dL (ref 0.44–1.00)
GFR calc Af Amer: >60 mL/min (ref >60)
GFR calc non Af Amer: >60 mL/min (ref >60)
Glucose, Bld: 152 mg/dL — ABNORMAL HIGH (ref 70–99)
Potassium: 3.2 mmol/L — ABNORMAL LOW (ref 3.5–5.1)
Sodium: 147 mmol/L — ABNORMAL HIGH (ref 135–145)

## 2019-10-24 NOTE — Progress Notes (Signed)
Location:    [place] [facility] Room Number: 128/P Place of Service:  SNF (31)   CODE STATUS: DNR  Allergies  Allergen Reactions  . Sulfamethoxazole-Trimethoprim Rash  . Alendronate     Unknown reaction  . Cephalexin Other (See Comments)    Unknown reaction Unknown reaction Unknown reaction  . Clindamycin/Lincomycin Hives  . Creon [Pancreatin] Hives  . Doxycycline Other (See Comments)    Unknown reaction Unknown reaction  . Florastor [Yeast] Hives  . Penicillins Other (See Comments)    Has patient had a PCN reaction causing immediate rash, facial/tongue/throat swelling, SOB or lightheadedness with hypotension: unknown Has patient had a PCN reaction causing severe rash involving mucus membranes or skin necrosis: unknown Has patient had a PCN reaction that required hospitalization unknown Has patient had a PCN reaction occurring within the last 10 years:unknown If all of the above answers are "NO", then may proceed with Cephalosporin use.  Marland Kitchen Zenpep [Pancrelipase (Lip-Prot-Amyl)] Hives    Chief Complaint  Patient presents with  . Follow-up    Lab Follow Up    HPI:  Her k+ is 3.0.   There are no reports of uncontrolled pain; no reports of agitation or anxiety. No reports of constipation.   Past Medical History:  Diagnosis Date  . Arthritis   . Diabetes mellitus without complication ([place])    "borderline"  . Diverticulosis of colon   . High cholesterol   . Hypertension   . Hypothyroidism   . Pancreatitis     Past Surgical History:  Procedure Laterality Date  . ABDOMINAL HYSTERECTOMY     partial  . APPENDECTOMY    . bladder tac    . CHOLECYSTECTOMY  1999   cholelithiasis  . COLONOSCOPY  01/2007   Dr Olevia Perches: normal  . COLONOSCOPY  12/2000   Dr Deatra Ina- diverticulosis  . ESOPHAGOGASTRODUODENOSCOPY     Dr Damaris Hippo cannot remember  . EUS N/A 03/24/2012   Dr. Paulita Fujita: suspected passed CBD stone as cause of acute pancreatitis, suspective chronic  pancreatitis.   Marland Kitchen EXCISION OF BREAST BIOPSY    . HIP PINNING,CANNULATED Left 01/15/2015   Procedure: CANNULATED HIP PINNING/LEFT;  Surgeon: Renette Butters, MD;  Location: [facility];  Service: Orthopedics;  Laterality: Left;  Marland Kitchen VEIN SURGERY      Social History   Socioeconomic History  . Marital status: Married    Spouse name: Not on file  . Number of children: 1  . Years of education: Not on file  . Highest education level: Not on file  Occupational History  . Occupation: retired, tobacco field  Tobacco Use  . Smoking status: Never Smoker  . Smokeless tobacco: Never Used  Substance and Sexual Activity  . Alcohol use: No    Alcohol/week: 0.0 standard drinks  . Drug use: No  . Sexual activity: Not on file  Other Topics Concern  . Not on file  Social History Narrative   Lost 1 son age 44-?MI   1 living son         Social Determinants of Health   Financial Resource Strain:   . Difficulty of Paying Living Expenses: Not on file  Food Insecurity:   . Worried About Charity fundraiser in the Last Year: Not on file  . Ran Out of Food in the Last Year: Not on file  Transportation Needs:   . Lack of Transportation (Medical): Not on file  . Lack of Transportation (Non-Medical): Not on file  Physical Activity:   .  Days of Exercise per Week: Not on file  . Minutes of Exercise per Session: Not on file  Stress:   . Feeling of Stress : Not on file  Social Connections:   . Frequency of Communication with Friends and Family: Not on file  . Frequency of Social Gatherings with Friends and Family: Not on file  . Attends Religious Services: Not on file  . Active Member of Clubs or Organizations: Not on file  . Attends Archivist Meetings: Not on file  . Marital Status: Not on file  Intimate Partner Violence:   . Fear of Current or Ex-Partner: Not on file  . Emotionally Abused: Not on file  . Physically Abused: Not on file  . Sexually Abused: Not on file   Family History    Problem Relation Age of Onset  . Diabetes Father   . Diabetes Mother   . Cancer Mother        ovarian  . Cancer Sister 40       breast  . Cancer Sister 54       bone      VITAL SIGNS BP 138/76   Pulse 70   Temp (!) 97 F (36.1 C) (Oral)   Resp (!) 22   Ht 5\' 2"  (1.575 m)   Wt 136 lb 3.2 oz (61.8 kg)   SpO2 100%   BMI 24.91 kg/m   Outpatient Encounter Medications as of 10/24/2019  Medication Sig  . amLODipine (NORVASC) 5 MG tablet Take 5 mg by mouth every morning.   Marland Kitchen aspirin EC 81 MG tablet Take 1 tablet (81 mg total) by mouth at bedtime for 21 days.  Marland Kitchen atorvastatin (LIPITOR) 40 MG tablet Take 1 tablet (40 mg total) by mouth daily.  . carboxymethylcellulose (REFRESH PLUS) 0.5 % SOLN Place 1 drop into both eyes 3 (three) times daily as needed (for dry eyes).   . clopidogrel (PLAVIX) 75 MG tablet Take 1 tablet (75 mg total) by mouth daily.  . famotidine (PEPCID) 40 MG tablet Take 40 mg by mouth 2 (two) times daily.  . fexofenadine (ALLEGRA) 180 MG tablet Take 180 mg by mouth daily.  . hydrOXYzine (ATARAX/VISTARIL) 25 MG tablet Take 25 mg by mouth every 8 (eight) hours as needed for itching.   . latanoprost (XALATAN) 0.005 % ophthalmic solution Place 1 drop into both eyes at bedtime.  Marland Kitchen levothyroxine (SYNTHROID) 88 MCG tablet Take 1 tablet by mouth daily.  Marland Kitchen lisinopril (PRINIVIL,ZESTRIL) 2.5 MG tablet Take 2.5 mg by mouth daily.   . metFORMIN (GLUCOPHAGE-XR) 500 MG 24 hr tablet Take 500 mg by mouth daily with breakfast.   . montelukast (SINGULAIR) 10 MG tablet Take 10 mg by mouth daily.  . Multiple Vitamin (MULTIVITAMIN WITH MINERALS) TABS tablet Take 1 tablet by mouth daily.  . NON FORMULARY NAS Dysphagia 1 diet with nectar thick liquids  . NON FORMULARY Magic cup daily Once A Day 08:00 PM  . potassium chloride SA (K-DUR,KLOR-CON) 20 MEQ tablet Take 20 mEq by mouth daily.  . [DISCONTINUED] Omega-3 Fatty Acids (FISH OIL) 1000 MG CAPS Take 1 capsule by mouth daily.    No  facility-administered encounter medications on file as of 10/24/2019.     SIGNIFICANT DIAGNOSTIC EXAMS   PREVIOUS   10-14-19: ct of head:  1. No evidence of acute intracranial abnormality. 2. ASPECTS is 10. 3. Mild chronic small vessel ischemic disease. 4. Unchanged large left mastoid effusion.  10-14-19: ct angio of head and neck:  1. No emergent large vessel occlusion. 2. Intracranial atherosclerosis with severe bilateral PCA stenoses. No significant proximal anterior circulation stenosis. 3. Widely patent cervical carotid and vertebral arteries. 4. Negative CTP. 5. Aortic Atherosclerosis   10-15-19: 2-d echo:  Left ventricular ejection fraction, by estimation, is 60 to 65%. The  left ventricle has normal function. The left ventricle has no regional  wall motion abnormalities. Left ventricular diastolic parameters were  normal.   10-16-19: MRI of brain:  1. Acute infarct of the left pons. No hemorrhage or mass effect. 2. Generalized atrophy and findings of chronic small vessel disease.   10-18-19: swallow study: Dysphagia 1 (Puree) solids;Nectar thick liquid   NO NEW EXAMS.    LABS REVIEWED PREVIOUS  10-14-19: wbc 8.6; hgb 13.8; hct 40.5; mcv 90.8 plt 252 glucose 160; bun 14; creat 0.59; k+ 3.7; na++ 132; ca 9.1 liver normal albumin 4.1 vit B 12: 744 free t4: 1.00 hgb a1c 5.8; chol 203; ldl 134; trig 66; hdl 56 10-17-19: wbc 8.2; hgb 11.9; hct 36.9; mcv 93.4 plt 188 glucose 98; bun 7; creat 0.56; k+ 3.0; na++ 137; ca 8.0 liver normal albumin 2.7 10/13/2019: glucose 115; bun 6; creat 0.52; k+ 3.2; na++ 136; ca 8.1  TODAY  10-24-19: glucose 152; bun 21; creat 0.49; k+ 3.2; na++ 147; ca 8.9   Review of Systems  Reason unable to perform ROS: aphasia    Physical Exam Constitutional:      General: She is not in acute distress.    Appearance: She is well-developed. She is not diaphoretic.  Neck:     Thyroid: No thyromegaly.  Cardiovascular:     Rate and Rhythm: Normal rate and regular  rhythm.     Pulses: Normal pulses.     Heart sounds: Normal heart sounds.  Pulmonary:     Effort: Pulmonary effort is normal. No respiratory distress.     Breath sounds: Normal breath sounds.  Abdominal:     General: Bowel sounds are normal. There is no distension.     Palpations: Abdomen is soft.     Tenderness: There is no abdominal tenderness.  Musculoskeletal:     Cervical back: Neck supple.     Right lower leg: No edema.     Left lower leg: No edema.     Comments: Right hemiplegia   Lymphadenopathy:     Cervical: No cervical adenopathy.  Skin:    General: Skin is warm and dry.  Neurological:     Mental Status: She is alert. Mental status is at baseline.  Psychiatric:        Mood and Affect: Mood normal.       ASSESSMENT/ PLAN:  TODAY  1. Hypokalemia 2. Hypernatremia  Will increase to k+ 20 meq twice daily  Will stop MVI Will check BMP in one week    MD is aware of resident's narcotic use and is in agreement with current plan of care. We will attempt to wean resident as appropriate.  Ok Edwards NP [facility]  Contact [phone] Monday through Friday 8am- 5pm  After hours call [phone]

## 2019-10-25 DIAGNOSIS — E875 Hyperkalemia: Secondary | ICD-10-CM | POA: Insufficient documentation

## 2019-10-27 ENCOUNTER — Non-Acute Institutional Stay (SKILLED_NURSING_FACILITY): Payer: PPO | Admitting: Adult Health

## 2019-10-27 ENCOUNTER — Encounter: Payer: Self-pay | Admitting: Adult Health

## 2019-10-27 DIAGNOSIS — K219 Gastro-esophageal reflux disease without esophagitis: Secondary | ICD-10-CM

## 2019-10-27 DIAGNOSIS — K861 Other chronic pancreatitis: Secondary | ICD-10-CM | POA: Diagnosis not present

## 2019-10-27 DIAGNOSIS — J3089 Other allergic rhinitis: Secondary | ICD-10-CM

## 2019-10-27 NOTE — Progress Notes (Signed)
Location:    [place] Room Number: 128/P Place of Service:  SNF (31)   CODE STATUS: DNR  Allergies  Allergen Reactions  . Sulfamethoxazole-Trimethoprim Rash  . Alendronate     Unknown reaction  . Cephalexin Other (See Comments)    Unknown reaction Unknown reaction Unknown reaction  . Clindamycin/Lincomycin Hives  . Creon [Pancreatin] Hives  . Doxycycline Other (See Comments)    Unknown reaction Unknown reaction  . Florastor [Yeast] Hives  . Penicillins Other (See Comments)    Has patient had a PCN reaction causing immediate rash, facial/tongue/throat swelling, SOB or lightheadedness with hypotension: unknown Has patient had a PCN reaction causing severe rash involving mucus membranes or skin necrosis: unknown Has patient had a PCN reaction that required hospitalization unknown Has patient had a PCN reaction occurring within the last 10 years:unknown If all of the above answers are "NO", then may proceed with Cephalosporin use.  Marland Kitchen Zenpep [Pancrelipase (Lip-Prot-Amyl)] Hives    Chief Complaint  Patient presents with  . Short Term Rehab (STR)            Chronic non-seasonal allergic rhinitis:     GERD without esophagitis:  Chronic pancreatitis unspecified pancreatitis type  Weekly follow up for the first 30 days post hospitalization.     HPI:  She is a 86 year old short term rehab patient being seen for the management of her chronic illnesses: Chronic non-seasonal allergic rhinitis:     GERD without esophagitis:  Chronic pancreatitis unspecified pancreatitis type. There are no reports of uncontrolled pain. She is restless at times. She now requires honey thick due to her dysphagia. She continues to be followed by therapy.   Past Medical History:  Diagnosis Date  . Arthritis   . Diabetes mellitus without complication ([place])    "borderline"  . Diverticulosis of colon   . High cholesterol   . Hypertension   . Hypothyroidism   . Pancreatitis      Past Surgical History:  Procedure Laterality Date  . ABDOMINAL HYSTERECTOMY     partial  . APPENDECTOMY    . bladder tac    . CHOLECYSTECTOMY  1999   cholelithiasis  . COLONOSCOPY  01/2007   Dr Olevia Perches: normal  . COLONOSCOPY  12/2000   Dr Deatra Ina- diverticulosis  . ESOPHAGOGASTRODUODENOSCOPY     Dr Damaris Hippo cannot remember  . EUS N/A 03/24/2012   Dr. Paulita Fujita: suspected passed CBD stone as cause of acute pancreatitis, suspective chronic pancreatitis.   Marland Kitchen EXCISION OF BREAST BIOPSY    . HIP PINNING,CANNULATED Left 01/15/2015   Procedure: CANNULATED HIP PINNING/LEFT;  Surgeon: Renette Butters, MD;  Location: [place];  Service: Orthopedics;  Laterality: Left;  Marland Kitchen VEIN SURGERY      Social History   Socioeconomic History  . Marital status: Married    Spouse name: Not on file  . Number of children: 1  . Years of education: Not on file  . Highest education level: Not on file  Occupational History  . Occupation: retired, tobacco field  Tobacco Use  . Smoking status: Never Smoker  . Smokeless tobacco: Never Used  Substance and Sexual Activity  . Alcohol use: No    Alcohol/week: 0.0 standard drinks  . Drug use: No  . Sexual activity: Not on file  Other Topics Concern  . Not on file  Social History Narrative   Lost 1 son age 69-?MI   1 living son  Social Determinants of Health   Financial Resource Strain:   . Difficulty of Paying Living Expenses: Not on file  Food Insecurity:   . Worried About Charity fundraiser in the Last Year: Not on file  . Ran Out of Food in the Last Year: Not on file  Transportation Needs:   . Lack of Transportation (Medical): Not on file  . Lack of Transportation (Non-Medical): Not on file  Physical Activity:   . Days of Exercise per Week: Not on file  . Minutes of Exercise per Session: Not on file  Stress:   . Feeling of Stress : Not on file  Social Connections:   . Frequency of Communication with Friends and Family: Not on file  .  Frequency of Social Gatherings with Friends and Family: Not on file  . Attends Religious Services: Not on file  . Active Member of Clubs or Organizations: Not on file  . Attends Archivist Meetings: Not on file  . Marital Status: Not on file  Intimate Partner Violence:   . Fear of Current or Ex-Partner: Not on file  . Emotionally Abused: Not on file  . Physically Abused: Not on file  . Sexually Abused: Not on file   Family History  Problem Relation Age of Onset  . Diabetes Father   . Diabetes Mother   . Cancer Mother        ovarian  . Cancer Sister 7       breast  . Cancer Sister 35       bone      VITAL SIGNS BP 108/66   Pulse 80   Temp (!) 96.4 F (35.8 C) (Oral)   Resp 20   Ht 5\' 2"  (1.575 m)   Wt 136 lb 3.2 oz (61.8 kg)   BMI 24.91 kg/m   Outpatient Encounter Medications as of 10/27/2019  Medication Sig  . amLODipine (NORVASC) 5 MG tablet Take 5 mg by mouth every morning.   Marland Kitchen aspirin EC 81 MG tablet Take 1 tablet (81 mg total) by mouth at bedtime for 21 days.  Marland Kitchen atorvastatin (LIPITOR) 40 MG tablet Take 1 tablet (40 mg total) by mouth daily.  . carboxymethylcellulose (REFRESH PLUS) 0.5 % SOLN Place 1 drop into both eyes 3 (three) times daily as needed (for dry eyes).   . clopidogrel (PLAVIX) 75 MG tablet Take 1 tablet (75 mg total) by mouth daily.  . dorzolamidel-timolol (COSOPT PF) 22.3-6.8 MG/ML SOLN ophthalmic solution Place 1 drop into both eyes 2 (two) times daily.  . famotidine (PEPCID) 40 MG tablet Take 40 mg by mouth 2 (two) times daily.  . fexofenadine (ALLEGRA) 180 MG tablet Take 180 mg by mouth daily.  . hydrOXYzine (ATARAX/VISTARIL) 25 MG tablet Take 25 mg by mouth every 8 (eight) hours as needed for itching.   . latanoprost (XALATAN) 0.005 % ophthalmic solution Place 1 drop into both eyes at bedtime.  Marland Kitchen levothyroxine (SYNTHROID) 100 MCG tablet Take 100 mcg by mouth daily.  Marland Kitchen lisinopril (PRINIVIL,ZESTRIL) 2.5 MG tablet Take 2.5 mg by mouth  daily.   . metFORMIN (GLUCOPHAGE-XR) 500 MG 24 hr tablet Take 500 mg by mouth daily with breakfast.   . montelukast (SINGULAIR) 10 MG tablet Take 10 mg by mouth daily.  . NON FORMULARY Diet: _____ Regular, ___x___Pureed, ______ NAS, _______Consistent Carbohydrate, _______NPO _____Other Special Instructions: Pureed Diet with Honey Thick Liquids  . NON FORMULARY Magic cup daily Once A Day 08:00 PM  . potassium chloride  SA (K-DUR,KLOR-CON) 20 MEQ tablet Take 20 mEq by mouth 2 (two) times daily.   . [DISCONTINUED] levothyroxine (SYNTHROID) 88 MCG tablet Take 1 tablet by mouth daily.  . [DISCONTINUED] Multiple Vitamin (MULTIVITAMIN WITH MINERALS) TABS tablet Take 1 tablet by mouth daily.   No facility-administered encounter medications on file as of 10/27/2019.     SIGNIFICANT DIAGNOSTIC EXAMS   PREVIOUS   10-14-19: ct of head:  1. No evidence of acute intracranial abnormality. 2. ASPECTS is 10. 3. Mild chronic small vessel ischemic disease. 4. Unchanged large left mastoid effusion.  10-14-19: ct angio of head and neck:  1. No emergent large vessel occlusion. 2. Intracranial atherosclerosis with severe bilateral PCA stenoses. No significant proximal anterior circulation stenosis. 3. Widely patent cervical carotid and vertebral arteries. 4. Negative CTP. 5. Aortic Atherosclerosis   10-15-19: 2-d echo:  Left ventricular ejection fraction, by estimation, is 60 to 65%. The  left ventricle has normal function. The left ventricle has no regional  wall motion abnormalities. Left ventricular diastolic parameters were  normal.   10-16-19: MRI of brain:  1. Acute infarct of the left pons. No hemorrhage or mass effect. 2. Generalized atrophy and findings of chronic small vessel disease.   10-18-19: swallow study: Dysphagia 1 (Puree) solids;Nectar thick liquid   NO NEW EXAMS.    LABS REVIEWED PREVIOUS  10-14-19: wbc 8.6; hgb 13.8; hct 40.5; mcv 90.8 plt 252 glucose 160; bun 14; creat 0.59; k+  3.7; na++ 132; ca 9.1 liver normal albumin 4.1 vit B 12: 744 free t4: 1.00 hgb a1c 5.8; chol 203; ldl 134; trig 66; hdl 56 10-17-19: wbc 8.2; hgb 11.9; hct 36.9; mcv 93.4 plt 188 glucose 98; bun 7; creat 0.56; k+ 3.0; na++ 137; ca 8.0 liver normal albumin 2.7 11/05/2019: glucose 115; bun 6; creat 0.52; k+ 3.2; na++ 136; ca 8.1 10-24-19: glucose 152; bun 21; creat 0.49; k+ 3.2; na++ 147; ca 8.9   NO NEW LABS.    Review of Systems  Reason unable to perform ROS: aphasia     Physical Exam Constitutional:      General: She is not in acute distress.    Appearance: She is well-developed. She is not diaphoretic.  Neck:     Thyroid: No thyromegaly.  Cardiovascular:     Rate and Rhythm: Normal rate and regular rhythm.     Pulses: Normal pulses.     Heart sounds: Normal heart sounds.  Pulmonary:     Effort: Pulmonary effort is normal. No respiratory distress.     Breath sounds: Normal breath sounds.  Abdominal:     General: Bowel sounds are normal. There is no distension.     Palpations: Abdomen is soft.     Tenderness: There is no abdominal tenderness.  Musculoskeletal:     Cervical back: Neck supple.     Right lower leg: No edema.     Left lower leg: No edema.     Comments: Right hemiplegia   Lymphadenopathy:     Cervical: No cervical adenopathy.  Skin:    General: Skin is warm and dry.  Neurological:     Mental Status: She is alert. Mental status is at baseline.  Psychiatric:        Mood and Affect: Mood normal.          ASSESSMENT/ PLAN:  TODAY  1. Chronic non-seasonal allergic rhinitis: is stable will continue allegra 180 mg daily and singulair 10 mg daily   2. GERD without esophagitis: is  stable will continue pepcid 40 mg twice daily   3. Chronic pancreatitis unspecified pancreatitis type: is stable with no recent flares. Will monitor    PREVIOUS  4. Acquired hypothyroidism: is stable free T4: 1.00 will continue synthroid 100 mcg daily   5. Hyperlipidemia associated  with type 2 diabetes mellitus: is stable LDL 134 will continue lipitor 40 mg daily fish oil daily   6. Type 2 diabetes with neurologic complications: is stable hgb a1c 5.8 will continue metformin xr 500 mg daily is on asa statin and ace  7. Chronic open angle glaucoma of both eyes undetermined stage: is stable will continue xalatan to both eyes   8. Hypokalemia: k+ 3.2 will continue k+ 20 meq twice daily   9.  Acute CVA (cerebrovascular accident/right hemiplegia: is neurological stable: will continue asa 81 mg daily for total 21 days.  plavix 75 mg daily   10. Dysphagia due to recent cerebrovascular accident (CVA): has declined; now requires honey thick liquids. No signs of aspiration present.   11. Hypertension associated with type 2 diabetes mellitus: is stable b/p 108/66 will continue norvasc 5 mg daily lisinopril 2.5 mg daily      Will check BMP       MD is aware of resident's narcotic use and is in agreement with current plan of care. We will attempt to wean resident as appropriate.  Ok Edwards NP [facility]  Contact [phone] Monday through Friday 8am- 5pm  After hours call [phone] .

## 2019-10-28 ENCOUNTER — Other Ambulatory Visit (HOSPITAL_COMMUNITY)
Admission: RE | Admit: 2019-10-28 | Discharge: 2019-10-28 | Disposition: A | Discharge status: Home / Self Care | Payer: PPO | Source: Skilled Nursing Facility | Attending: Adult Health | Admitting: Adult Health

## 2019-10-28 ENCOUNTER — Encounter: Payer: Self-pay | Admitting: Adult Health

## 2019-10-28 ENCOUNTER — Non-Acute Institutional Stay (SKILLED_NURSING_FACILITY): Payer: PPO | Admitting: Adult Health

## 2019-10-28 DIAGNOSIS — I639 Cerebral infarction, unspecified: Secondary | ICD-10-CM | POA: Diagnosis not present

## 2019-10-28 DIAGNOSIS — G8191 Hemiplegia, unspecified affecting right dominant side: Secondary | ICD-10-CM | POA: Diagnosis not present

## 2019-10-28 DIAGNOSIS — I69391 Dysphagia following cerebral infarction: Secondary | ICD-10-CM

## 2019-10-28 DIAGNOSIS — E1159 Type 2 diabetes mellitus with other circulatory complications: Secondary | ICD-10-CM | POA: Diagnosis not present

## 2019-10-28 LAB — BASIC METABOLIC PANEL
Anion gap: 9 (ref 5–15)
BUN: 29 mg/dL — ABNORMAL HIGH (ref 8–23)
CO2: 24 mmol/L (ref 22–32)
Calcium: 9 mg/dL (ref 8.9–10.3)
Chloride: 117 mmol/L — ABNORMAL HIGH (ref 98–111)
Creatinine, Ser: 0.69 mg/dL (ref 0.44–1.00)
GFR calc Af Amer: >60 mL/min (ref >60)
GFR calc non Af Amer: >60 mL/min (ref >60)
Glucose, Bld: 206 mg/dL — ABNORMAL HIGH (ref 70–99)
Potassium: 4.1 mmol/L (ref 3.5–5.1)
Sodium: 150 mmol/L — ABNORMAL HIGH (ref 135–145)

## 2019-10-28 NOTE — Progress Notes (Signed)
Location:  [place] Room Number: 128-P Place of Service:  SNF (31)   CODE STATUS: DNR  Allergies  Allergen Reactions  . Sulfamethoxazole-Trimethoprim Rash  . Alendronate     Unknown reaction  . Cephalexin Other (See Comments)    Unknown reaction Unknown reaction Unknown reaction  . Clindamycin/Lincomycin Hives  . Creon [Pancreatin] Hives  . Doxycycline Other (See Comments)    Unknown reaction Unknown reaction  . Florastor [Yeast] Hives  . Penicillins Other (See Comments)    Has patient had a PCN reaction causing immediate rash, facial/tongue/throat swelling, SOB or lightheadedness with hypotension: unknown Has patient had a PCN reaction causing severe rash involving mucus membranes or skin necrosis: unknown Has patient had a PCN reaction that required hospitalization unknown Has patient had a PCN reaction occurring within the last 10 years:unknown If all of the above answers are "NO", then may proceed with Cephalosporin use.  Marland Kitchen Zenpep [Pancrelipase (Lip-Prot-Amyl)] Hives    Chief Complaint  Patient presents with  . Acute Visit    Patient is seen for a Care Plan Meeting    HPI:  We have come together for her care plan meeting. Family present. She is now on honey thick liquids. Her right side is flaccid and did not respond to e-stim. She requires 2 max assist to sit on edge of bed and with her adls; she is incontinent of bladder and bowel. She requires assistance with her meals. She does not appear to comfortable; is restless at times. Her dentures are fitting poorly. She has an unstaged area on her sacrum. Her family would like to focus upon quality of her live versus quantity. They do not want a tube feeding and she is remain a DNR. They are in agreement that she would benefit from SSRI and from pain management. The goal of her care is long term placement. She will continue to be followed for her chronic illnesses including: Acute CVA (cerebrovascular  accident) Dysphagia due to recent CVA  Right hemiplegia  Past Medical History:  Diagnosis Date  . Arthritis   . Diabetes mellitus without complication ([place])    "borderline"  . Diverticulosis of colon   . High cholesterol   . Hypertension   . Hypothyroidism   . Pancreatitis     Past Surgical History:  Procedure Laterality Date  . ABDOMINAL HYSTERECTOMY     partial  . APPENDECTOMY    . bladder tac    . CHOLECYSTECTOMY  1999   cholelithiasis  . COLONOSCOPY  01/2007   Dr Olevia Perches: normal  . COLONOSCOPY  12/2000   Dr Deatra Ina- diverticulosis  . ESOPHAGOGASTRODUODENOSCOPY     Dr Damaris Hippo cannot remember  . EUS N/A 03/24/2012   Dr. Paulita Fujita: suspected passed CBD stone as cause of acute pancreatitis, suspective chronic pancreatitis.   Marland Kitchen EXCISION OF BREAST BIOPSY    . HIP PINNING,CANNULATED Left 01/15/2015   Procedure: CANNULATED HIP PINNING/LEFT;  Surgeon: Renette Butters, MD;  Location: [place];  Service: Orthopedics;  Laterality: Left;  Marland Kitchen VEIN SURGERY      Social History   Socioeconomic History  . Marital status: Married    Spouse name: Not on file  . Number of children: 1  . Years of education: Not on file  . Highest education level: Not on file  Occupational History  . Occupation: retired, tobacco field  Tobacco Use  . Smoking status: Never Smoker  . Smokeless tobacco: Never Used  Substance and Sexual Activity  .  Alcohol use: No    Alcohol/week: 0.0 standard drinks  . Drug use: No  . Sexual activity: Not on file  Other Topics Concern  . Not on file  Social History Narrative   Lost 1 son age 12-?MI   1 living son         Social Determinants of Health   Financial Resource Strain:   . Difficulty of Paying Living Expenses: Not on file  Food Insecurity:   . Worried About Charity fundraiser in the Last Year: Not on file  . Ran Out of Food in the Last Year: Not on file  Transportation Needs:   . Lack of Transportation (Medical): Not on file  . Lack of  Transportation (Non-Medical): Not on file  Physical Activity:   . Days of Exercise per Week: Not on file  . Minutes of Exercise per Session: Not on file  Stress:   . Feeling of Stress : Not on file  Social Connections:   . Frequency of Communication with Friends and Family: Not on file  . Frequency of Social Gatherings with Friends and Family: Not on file  . Attends Religious Services: Not on file  . Active Member of Clubs or Organizations: Not on file  . Attends Archivist Meetings: Not on file  . Marital Status: Not on file  Intimate Partner Violence:   . Fear of Current or Ex-Partner: Not on file  . Emotionally Abused: Not on file  . Physically Abused: Not on file  . Sexually Abused: Not on file   Family History  Problem Relation Age of Onset  . Diabetes Father   . Diabetes Mother   . Cancer Mother        ovarian  . Cancer Sister 61       breast  . Cancer Sister 42       bone      VITAL SIGNS BP 108/66   Pulse 80   Temp (!) 96.4 F (35.8 C) (Oral)   Resp 20   Ht 5\' 2"  (1.575 m)   Wt 136 lb 3.2 oz (61.8 kg)   BMI 24.91 kg/m   Outpatient Encounter Medications as of 10/28/2019  Medication Sig  . amLODipine (NORVASC) 5 MG tablet Take 5 mg by mouth every morning.   Marland Kitchen aspirin EC 81 MG tablet Take 1 tablet (81 mg total) by mouth at bedtime for 21 days.  Marland Kitchen atorvastatin (LIPITOR) 40 MG tablet Take 1 tablet (40 mg total) by mouth daily.  . carboxymethylcellulose (REFRESH PLUS) 0.5 % SOLN Place 1 drop into both eyes 3 (three) times daily as needed (for dry eyes).   . clopidogrel (PLAVIX) 75 MG tablet Take 1 tablet (75 mg total) by mouth daily.  . collagenase (SANTYL) ointment Apply 1 application topically daily. Apply to sacral wound  . dorzolamidel-timolol (COSOPT PF) 22.3-6.8 MG/ML SOLN ophthalmic solution Place 1 drop into both eyes 2 (two) times daily.  . famotidine (PEPCID) 40 MG tablet Take 40 mg by mouth 2 (two) times daily.  . fexofenadine (ALLEGRA) 180  MG tablet Take 180 mg by mouth daily.  Marland Kitchen latanoprost (XALATAN) 0.005 % ophthalmic solution Place 1 drop into both eyes at bedtime.  Marland Kitchen levothyroxine (SYNTHROID) 100 MCG tablet Take 100 mcg by mouth daily.  Marland Kitchen lisinopril (PRINIVIL,ZESTRIL) 2.5 MG tablet Take 2.5 mg by mouth daily.   . metFORMIN (GLUCOPHAGE-XR) 500 MG 24 hr tablet Take 500 mg by mouth daily with breakfast.   . montelukast (  SINGULAIR) 10 MG tablet Take 10 mg by mouth daily.  . NON FORMULARY Diet: _____ Regular, ___x___Pureed, ______ NAS, _______Consistent Carbohydrate, _______NPO _____Other Special Instructions: Pureed Diet with Honey Thick Liquids  . Nutritional Supplements (NUTRITIONAL SUPPLEMENT PO) Take 1 each by mouth daily. Magic Cup  . potassium chloride SA (K-DUR,KLOR-CON) 20 MEQ tablet Take 20 mEq by mouth 2 (two) times daily.   . [DISCONTINUED] hydrOXYzine (ATARAX/VISTARIL) 25 MG tablet Take 25 mg by mouth every 8 (eight) hours as needed for itching.   . [DISCONTINUED] NON FORMULARY Magic cup daily Once A Day 08:00 PM   No facility-administered encounter medications on file as of 10/28/2019.     SIGNIFICANT DIAGNOSTIC EXAMS  PREVIOUS   10-14-19: ct of head:  1. No evidence of acute intracranial abnormality. 2. ASPECTS is 10. 3. Mild chronic small vessel ischemic disease. 4. Unchanged large left mastoid effusion.  10-14-19: ct angio of head and neck:  1. No emergent large vessel occlusion. 2. Intracranial atherosclerosis with severe bilateral PCA stenoses. No significant proximal anterior circulation stenosis. 3. Widely patent cervical carotid and vertebral arteries. 4. Negative CTP. 5. Aortic Atherosclerosis   10-15-19: 2-d echo:  Left ventricular ejection fraction, by estimation, is 60 to 65%. The  left ventricle has normal function. The left ventricle has no regional  wall motion abnormalities. Left ventricular diastolic parameters were  normal.   10-16-19: MRI of brain:  1. Acute infarct of the left pons.  No hemorrhage or mass effect. 2. Generalized atrophy and findings of chronic small vessel disease.   10-18-19: swallow study: Dysphagia 1 (Puree) solids;Nectar thick liquid   NO NEW EXAMS.    LABS REVIEWED PREVIOUS  10-14-19: wbc 8.6; hgb 13.8; hct 40.5; mcv 90.8 plt 252 glucose 160; bun 14; creat 0.59; k+ 3.7; na++ 132; ca 9.1 liver normal albumin 4.1 vit B 12: 744 free t4: 1.00 hgb a1c 5.8; chol 203; ldl 134; trig 66; hdl 56 10-17-19: wbc 8.2; hgb 11.9; hct 36.9; mcv 93.4 plt 188 glucose 98; bun 7; creat 0.56; k+ 3.0; na++ 137; ca 8.0 liver normal albumin 2.7 10/21/2019: glucose 115; bun 6; creat 0.52; k+ 3.2; na++ 136; ca 8.1 10-24-19: glucose 152; bun 21; creat 0.49; k+ 3.2; na++ 147; ca 8.9   TODAY  10-28-19: glucose 206; bun 29; creat 0.69; k+ 4.1; na++ 150 ca 9.0   Review of Systems  Reason unable to perform ROS: aphasia     Physical Exam Constitutional:      General: She is not in acute distress.    Appearance: She is well-developed. She is not diaphoretic.  Neck:     Thyroid: No thyromegaly.  Cardiovascular:     Rate and Rhythm: Normal rate and regular rhythm.     Pulses: Normal pulses.     Heart sounds: Normal heart sounds.  Pulmonary:     Effort: Pulmonary effort is normal. No respiratory distress.     Breath sounds: Normal breath sounds.  Abdominal:     General: Bowel sounds are normal. There is no distension.     Palpations: Abdomen is soft.     Tenderness: There is no abdominal tenderness.  Musculoskeletal:     Cervical back: Neck supple.     Right lower leg: No edema.     Left lower leg: No edema.     Comments: Right side flaccid hemiplegia   Lymphadenopathy:     Cervical: No cervical adenopathy.  Skin:    General: Skin is warm and dry.  Neurological:     Mental Status: She is alert. Mental status is at baseline.  Psychiatric:        Mood and Affect: Mood normal.        ASSESSMENT/ PLAN:  TODAY  1. Acute CVA (cerebrovascular accident) 2. Dysphagia  due to recent CVA 3. Right hemiplegia 4. Hypernatremia  Will begin 0.45 NS at 75 cc per hour for 2 liters Will repeat BMP 10-31-19 Will begin zoloft 50 mg daily  Will begin tylenol 650 mg every 6 hours  No feeding tube Goal is for long term placement.      MD is aware of resident's narcotic use and is in agreement with current plan of care. We will attempt to wean resident as appropriate.  Ok Edwards NP [facility]  Contact [phone] Monday through Friday 8am- 5pm  After hours call [phone]

## 2019-10-31 ENCOUNTER — Encounter (HOSPITAL_COMMUNITY)
Admission: RE | Admit: 2019-10-31 | Discharge: 2019-10-31 | Disposition: A | Discharge status: Home / Self Care | Payer: PPO | Source: Skilled Nursing Facility | Attending: Adult Health | Admitting: Adult Health

## 2019-10-31 ENCOUNTER — Encounter: Payer: Self-pay | Admitting: Adult Health

## 2019-10-31 ENCOUNTER — Non-Acute Institutional Stay (SKILLED_NURSING_FACILITY): Payer: PPO | Admitting: Adult Health

## 2019-10-31 DIAGNOSIS — I69391 Dysphagia following cerebral infarction: Secondary | ICD-10-CM | POA: Diagnosis not present

## 2019-10-31 DIAGNOSIS — E870 Hyperosmolality and hypernatremia: Secondary | ICD-10-CM

## 2019-10-31 DIAGNOSIS — I10 Essential (primary) hypertension: Secondary | ICD-10-CM | POA: Diagnosis not present

## 2019-10-31 LAB — BASIC METABOLIC PANEL
Anion gap: 9 (ref 5–15)
BUN: 21 mg/dL (ref 8–23)
CO2: 24 mmol/L (ref 22–32)
Calcium: 8.8 mg/dL — ABNORMAL LOW (ref 8.9–10.3)
Chloride: 114 mmol/L — ABNORMAL HIGH (ref 98–111)
Creatinine, Ser: 0.51 mg/dL (ref 0.44–1.00)
GFR calc Af Amer: >60 mL/min (ref >60)
GFR calc non Af Amer: >60 mL/min (ref >60)
Glucose, Bld: 167 mg/dL — ABNORMAL HIGH (ref 70–99)
Potassium: 3.8 mmol/L (ref 3.5–5.1)
Sodium: 147 mmol/L — ABNORMAL HIGH (ref 135–145)

## 2019-10-31 NOTE — Progress Notes (Signed)
Location:    [place] Room Number: 128/P Place of Service:  SNF (31)   CODE STATUS: DNR  Allergies  Allergen Reactions  . Sulfamethoxazole-Trimethoprim Rash  . Alendronate     Unknown reaction  . Cephalexin Other (See Comments)    Unknown reaction Unknown reaction Unknown reaction  . Clindamycin/Lincomycin Hives  . Creon [Pancreatin] Hives  . Doxycycline Other (See Comments)    Unknown reaction Unknown reaction  . Florastor [Yeast] Hives  . Penicillins Other (See Comments)    Has patient had a PCN reaction causing immediate rash, facial/tongue/throat swelling, SOB or lightheadedness with hypotension: unknown Has patient had a PCN reaction causing severe rash involving mucus membranes or skin necrosis: unknown Has patient had a PCN reaction that required hospitalization unknown Has patient had a PCN reaction occurring within the last 10 years:unknown If all of the above answers are "NO", then may proceed with Cephalosporin use.  Marland Kitchen Zenpep [Pancrelipase (Lip-Prot-Amyl)] Hives    Chief Complaint  Patient presents with  . Follow-up    Lab Follow Up    HPI:  Her sodium level remains elevated at 147. There are no reports of aspiration. There are no reports of heart burn or of constipation. Her po intake remains variable.   Past Medical History:  Diagnosis Date  . Arthritis   . Diabetes mellitus without complication ([place])    "borderline"  . Diverticulosis of colon   . High cholesterol   . Hypertension   . Hypothyroidism   . Pancreatitis     Past Surgical History:  Procedure Laterality Date  . ABDOMINAL HYSTERECTOMY     partial  . APPENDECTOMY    . bladder tac    . CHOLECYSTECTOMY  1999   cholelithiasis  . COLONOSCOPY  01/2007   Dr Olevia Perches: normal  . COLONOSCOPY  12/2000   Dr Deatra Ina- diverticulosis  . ESOPHAGOGASTRODUODENOSCOPY     Dr Damaris Hippo cannot remember  . EUS N/A 03/24/2012   Dr. Paulita Fujita: suspected passed CBD stone as cause of  acute pancreatitis, suspective chronic pancreatitis.   Marland Kitchen EXCISION OF BREAST BIOPSY    . HIP PINNING,CANNULATED Left 01/15/2015   Procedure: CANNULATED HIP PINNING/LEFT;  Surgeon: Renette Butters, MD;  Location: [facility];  Service: Orthopedics;  Laterality: Left;  Marland Kitchen VEIN SURGERY      Social History   Socioeconomic History  . Marital status: Married    Spouse name: Not on file  . Number of children: 1  . Years of education: Not on file  . Highest education level: Not on file  Occupational History  . Occupation: retired, tobacco field  Tobacco Use  . Smoking status: Never Smoker  . Smokeless tobacco: Never Used  Substance and Sexual Activity  . Alcohol use: No    Alcohol/week: 0.0 standard drinks  . Drug use: No  . Sexual activity: Not on file  Other Topics Concern  . Not on file  Social History Narrative   Lost 1 son age 70-?MI   1 living son         Social Determinants of Health   Financial Resource Strain:   . Difficulty of Paying Living Expenses: Not on file  Food Insecurity:   . Worried About Charity fundraiser in the Last Year: Not on file  . Ran Out of Food in the Last Year: Not on file  Transportation Needs:   . Lack of Transportation (Medical): Not on file  . Lack of Transportation (Non-Medical): Not  on file  Physical Activity:   . Days of Exercise per Week: Not on file  . Minutes of Exercise per Session: Not on file  Stress:   . Feeling of Stress : Not on file  Social Connections:   . Frequency of Communication with Friends and Family: Not on file  . Frequency of Social Gatherings with Friends and Family: Not on file  . Attends Religious Services: Not on file  . Active Member of Clubs or Organizations: Not on file  . Attends Archivist Meetings: Not on file  . Marital Status: Not on file  Intimate Partner Violence:   . Fear of Current or Ex-Partner: Not on file  . Emotionally Abused: Not on file  . Physically Abused: Not on file  . Sexually  Abused: Not on file   Family History  Problem Relation Age of Onset  . Diabetes Father   . Diabetes Mother   . Cancer Mother        ovarian  . Cancer Sister 53       breast  . Cancer Sister 63       bone      VITAL SIGNS BP 137/79   Pulse 77   Temp (!) 97.4 F (36.3 C) (Oral)   Resp (!) 28   Ht 5\' 2"  (1.575 m)   Wt 133 lb 9.6 oz (60.6 kg)   SpO2 96%   BMI 24.44 kg/m   Outpatient Encounter Medications as of 10/31/2019  Medication Sig  . acetaminophen (TYLENOL) 325 MG tablet Take 650 mg by mouth every 6 (six) hours.  Marland Kitchen amLODipine (NORVASC) 5 MG tablet Take 5 mg by mouth every morning.   Marland Kitchen aspirin EC 81 MG tablet Take 1 tablet (81 mg total) by mouth at bedtime for 21 days.  Marland Kitchen atorvastatin (LIPITOR) 40 MG tablet Take 1 tablet (40 mg total) by mouth daily.  . carboxymethylcellulose (REFRESH PLUS) 0.5 % SOLN Place 1 drop into both eyes 3 (three) times daily as needed (for dry eyes).   . clopidogrel (PLAVIX) 75 MG tablet Take 1 tablet (75 mg total) by mouth daily.  . collagenase (SANTYL) ointment Apply 1 application topically daily. Apply to sacral wound  . dorzolamidel-timolol (COSOPT PF) 22.3-6.8 MG/ML SOLN ophthalmic solution Place 1 drop into both eyes 2 (two) times daily.  . famotidine (PEPCID) 40 MG tablet Take 40 mg by mouth 2 (two) times daily.  . fexofenadine (ALLEGRA) 180 MG tablet Take 180 mg by mouth daily.  Marland Kitchen latanoprost (XALATAN) 0.005 % ophthalmic solution Place 1 drop into both eyes at bedtime.  Marland Kitchen levothyroxine (SYNTHROID) 100 MCG tablet Take 100 mcg by mouth daily.  Marland Kitchen lisinopril (PRINIVIL,ZESTRIL) 2.5 MG tablet Take 2.5 mg by mouth daily.   . metFORMIN (GLUCOPHAGE-XR) 500 MG 24 hr tablet Take 500 mg by mouth daily with breakfast.   . montelukast (SINGULAIR) 10 MG tablet Take 10 mg by mouth daily.  . NON FORMULARY Diet: _____ Regular, ___x___Pureed, ______ NAS, _______Consistent Carbohydrate, _______NPO _____Other Special Instructions: Pureed Diet with Honey  Thick Liquids  . Nutritional Supplements (NUTRITIONAL SUPPLEMENT PO) Take 1 each by mouth daily. Magic Cup  . potassium chloride SA (K-DUR,KLOR-CON) 20 MEQ tablet Take 20 mEq by mouth 2 (two) times daily.   . sertraline (ZOLOFT) 50 MG tablet Take 50 mg by mouth daily.   No facility-administered encounter medications on file as of 10/31/2019.     SIGNIFICANT DIAGNOSTIC EXAMS  PREVIOUS   10-14-19: ct of head:  1. No evidence of acute intracranial abnormality. 2. ASPECTS is 10. 3. Mild chronic small vessel ischemic disease. 4. Unchanged large left mastoid effusion.  10-14-19: ct angio of head and neck:  1. No emergent large vessel occlusion. 2. Intracranial atherosclerosis with severe bilateral PCA stenoses. No significant proximal anterior circulation stenosis. 3. Widely patent cervical carotid and vertebral arteries. 4. Negative CTP. 5. Aortic Atherosclerosis   10-15-19: 2-d echo:  Left ventricular ejection fraction, by estimation, is 60 to 65%. The  left ventricle has normal function. The left ventricle has no regional  wall motion abnormalities. Left ventricular diastolic parameters were  normal.   10-16-19: MRI of brain:  1. Acute infarct of the left pons. No hemorrhage or mass effect. 2. Generalized atrophy and findings of chronic small vessel disease.   10-18-19: swallow study: Dysphagia 1 (Puree) solids;Nectar thick liquid   NO NEW EXAMS.    LABS REVIEWED PREVIOUS  10-14-19: wbc 8.6; hgb 13.8; hct 40.5; mcv 90.8 plt 252 glucose 160; bun 14; creat 0.59; k+ 3.7; na++ 132; ca 9.1 liver normal albumin 4.1 vit B 12: 744 free t4: 1.00 hgb a1c 5.8; chol 203; ldl 134; trig 66; hdl 56 10-17-19: wbc 8.2; hgb 11.9; hct 36.9; mcv 93.4 plt 188 glucose 98; bun 7; creat 0.56; k+ 3.0; na++ 137; ca 8.0 liver normal albumin 2.7 11/08/2019: glucose 115; bun 6; creat 0.52; k+ 3.2; na++ 136; ca 8.1 10-24-19: glucose 152; bun 21; creat 0.49; k+ 3.2; na++ 147; ca 8.9  10-28-19: glucose 206; bun 29; creat 0.69;  k+ 4.1; na++ 150 ca 9.0  TODAY  10-31-19: glucose 167; bun 21; creat 0.51; k+ 3.8; na++ 147; ca 8.8    Review of Systems  Reason unable to perform ROS: aphasia     Physical Exam Constitutional:      General: She is not in acute distress.    Appearance: She is well-developed. She is not diaphoretic.  Neck:     Thyroid: No thyromegaly.  Cardiovascular:     Rate and Rhythm: Normal rate and regular rhythm.     Pulses: Normal pulses.     Heart sounds: Normal heart sounds.  Pulmonary:     Effort: Pulmonary effort is normal. No respiratory distress.     Breath sounds: Normal breath sounds.  Abdominal:     General: Bowel sounds are normal. There is no distension.     Palpations: Abdomen is soft.     Tenderness: There is no abdominal tenderness.  Musculoskeletal:     Cervical back: Neck supple.     Right lower leg: No edema.     Left lower leg: No edema.     Comments: Right side flaccid hemiplegia    Lymphadenopathy:     Cervical: No cervical adenopathy.  Skin:    General: Skin is warm and dry.  Neurological:     Mental Status: She is alert. Mental status is at baseline.  Psychiatric:        Mood and Affect: Mood normal.     ASSESSMENT/ PLAN:  TODAY  1. Hyponatremia 2. Dysphagia due to recent cerebrovascular accident (CVA)  Will begin 0.45 % NS at 75 cc per hour for 2 liters  Will then repeat BMP Will monitor her status.   MD is aware of resident's narcotic use and is in agreement with current plan of care. We will attempt to wean resident as appropriate.  Ok Edwards NP [facility]  Contact [phone] Monday through Friday 8am- 5pm  After hours call [phone]

## 2019-11-01 ENCOUNTER — Encounter: Payer: Self-pay | Admitting: Adult Health

## 2019-11-01 DIAGNOSIS — E870 Hyperosmolality and hypernatremia: Secondary | ICD-10-CM | POA: Insufficient documentation

## 2019-11-02 ENCOUNTER — Other Ambulatory Visit (HOSPITAL_COMMUNITY)
Admission: RE | Admit: 2019-11-02 | Discharge: 2019-11-02 | Disposition: A | Discharge status: Home / Self Care | Payer: PPO | Source: Skilled Nursing Facility | Attending: Adult Health | Admitting: Adult Health

## 2019-11-02 DIAGNOSIS — I10 Essential (primary) hypertension: Secondary | ICD-10-CM | POA: Insufficient documentation

## 2019-11-02 LAB — BASIC METABOLIC PANEL
Anion gap: 8 (ref 5–15)
BUN: 16 mg/dL (ref 8–23)
CO2: 26 mmol/L (ref 22–32)
Calcium: 8.7 mg/dL — ABNORMAL LOW (ref 8.9–10.3)
Chloride: 107 mmol/L (ref 98–111)
Creatinine, Ser: 0.49 mg/dL (ref 0.44–1.00)
GFR calc Af Amer: >60 mL/min (ref >60)
GFR calc non Af Amer: >60 mL/min (ref >60)
Glucose, Bld: 147 mg/dL — ABNORMAL HIGH (ref 70–99)
Potassium: 4 mmol/L (ref 3.5–5.1)
Sodium: 141 mmol/L (ref 135–145)

## 2019-11-03 ENCOUNTER — Encounter: Payer: Self-pay | Admitting: Adult Health

## 2019-11-03 ENCOUNTER — Non-Acute Institutional Stay (SKILLED_NURSING_FACILITY): Payer: PPO | Admitting: Adult Health

## 2019-11-03 DIAGNOSIS — G8191 Hemiplegia, unspecified affecting right dominant side: Secondary | ICD-10-CM

## 2019-11-03 DIAGNOSIS — I69391 Dysphagia following cerebral infarction: Secondary | ICD-10-CM

## 2019-11-03 DIAGNOSIS — I639 Cerebral infarction, unspecified: Secondary | ICD-10-CM

## 2019-11-03 NOTE — Progress Notes (Signed)
Location:    [place] Room Number: 128/P Place of Service:  SNF (31)   CODE STATUS: DNR  Allergies  Allergen Reactions  . Sulfamethoxazole-Trimethoprim Rash  . Alendronate     Unknown reaction  . Cephalexin Other (See Comments)    Unknown reaction Unknown reaction Unknown reaction  . Clindamycin/Lincomycin Hives  . Creon [Pancreatin] Hives  . Doxycycline Other (See Comments)    Unknown reaction Unknown reaction  . Florastor [Yeast] Hives  . Penicillins Other (See Comments)    Has patient had a PCN reaction causing immediate rash, facial/tongue/throat swelling, SOB or lightheadedness with hypotension: unknown Has patient had a PCN reaction causing severe rash involving mucus membranes or skin necrosis: unknown Has patient had a PCN reaction that required hospitalization unknown Has patient had a PCN reaction occurring within the last 10 years:unknown If all of the above answers are "NO", then may proceed with Cephalosporin use.  Marland Kitchen Zenpep [Pancrelipase (Lip-Prot-Amyl)] Hives    Chief Complaint  Patient presents with  . Acute Visit    Care Plan Meeting    HPI:  We have come together for her care plan meeting. Family meeting. Unable to do BIMS; mood 4/30. There are no reports of falls. She is dependent with her adls is incontinent of bladder and bowel. She working with therapy. She did have a very slight muscle contraction on her right leg; not on arm. Her weight is 136 pounds; her usual weight is in the 140's. She has been having lethargy has had some medications at bed time to help. She may be a candidate for provigil. There are no reports of uncontrolled pain; no reports of aspiration present. She continues to be followed for her chronic illnesses including: Acute CVA (cerebrovascular accident)  Right hemiplegia Dysphagia due to recent cerebrovascular accident  Past Medical History:  Diagnosis Date  . Arthritis   . Diabetes mellitus without  complication ([place])    "borderline"  . Diverticulosis of colon   . Dysphagia due to recent cerebrovascular accident (CVA) 10/21/2019  . High cholesterol   . Hypertension   . Hypothyroidism   . Pancreatitis     Past Surgical History:  Procedure Laterality Date  . ABDOMINAL HYSTERECTOMY     partial  . APPENDECTOMY    . bladder tac    . CHOLECYSTECTOMY  1999   cholelithiasis  . COLONOSCOPY  01/2007   Dr Olevia Perches: normal  . COLONOSCOPY  12/2000   Dr Deatra Ina- diverticulosis  . ESOPHAGOGASTRODUODENOSCOPY     Dr Damaris Hippo cannot remember  . EUS N/A 03/24/2012   Dr. Paulita Fujita: suspected passed CBD stone as cause of acute pancreatitis, suspective chronic pancreatitis.   Marland Kitchen EXCISION OF BREAST BIOPSY    . HIP PINNING,CANNULATED Left 01/15/2015   Procedure: CANNULATED HIP PINNING/LEFT;  Surgeon: Renette Butters, MD;  Location: [facility];  Service: Orthopedics;  Laterality: Left;  Marland Kitchen VEIN SURGERY      Social History   Socioeconomic History  . Marital status: Married    Spouse name: Not on file  . Number of children: 1  . Years of education: Not on file  . Highest education level: Not on file  Occupational History  . Occupation: retired, tobacco field  Tobacco Use  . Smoking status: Never Smoker  . Smokeless tobacco: Never Used  Substance and Sexual Activity  . Alcohol use: No    Alcohol/week: 0.0 standard drinks  . Drug use: No  . Sexual activity: Not on file  Other Topics Concern  . Not on file  Social History Narrative   Lost 1 son age 78-?MI   1 living son         Social Determinants of Health   Financial Resource Strain:   . Difficulty of Paying Living Expenses: Not on file  Food Insecurity:   . Worried About Charity fundraiser in the Last Year: Not on file  . Ran Out of Food in the Last Year: Not on file  Transportation Needs:   . Lack of Transportation (Medical): Not on file  . Lack of Transportation (Non-Medical): Not on file  Physical Activity:   . Days of Exercise  per Week: Not on file  . Minutes of Exercise per Session: Not on file  Stress:   . Feeling of Stress : Not on file  Social Connections:   . Frequency of Communication with Friends and Family: Not on file  . Frequency of Social Gatherings with Friends and Family: Not on file  . Attends Religious Services: Not on file  . Active Member of Clubs or Organizations: Not on file  . Attends Archivist Meetings: Not on file  . Marital Status: Not on file  Intimate Partner Violence:   . Fear of Current or Ex-Partner: Not on file  . Emotionally Abused: Not on file  . Physically Abused: Not on file  . Sexually Abused: Not on file   Family History  Problem Relation Age of Onset  . Diabetes Father   . Diabetes Mother   . Cancer Mother        ovarian  . Cancer Sister 66       breast  . Cancer Sister 30       bone      VITAL SIGNS BP 134/79   Pulse 86   Temp (!) 97.3 F (36.3 C)   Resp 16   Ht 5\' 2"  (1.575 m)   Wt 136 lb 6.4 oz (61.9 kg)   SpO2 96%   BMI 24.95 kg/m   Outpatient Encounter Medications as of 11/03/2019  Medication Sig  . acetaminophen (TYLENOL) 325 MG tablet Take 650 mg by mouth every 6 (six) hours.  Marland Kitchen amLODipine (NORVASC) 5 MG tablet Take 5 mg by mouth every morning.   Marland Kitchen aspirin EC 81 MG tablet Take 1 tablet (81 mg total) by mouth at bedtime for 21 days.  Marland Kitchen atorvastatin (LIPITOR) 40 MG tablet Take 1 tablet (40 mg total) by mouth daily.  . carboxymethylcellulose (REFRESH PLUS) 0.5 % SOLN Place 1 drop into both eyes 3 (three) times daily as needed (for dry eyes).   . clopidogrel (PLAVIX) 75 MG tablet Take 1 tablet (75 mg total) by mouth daily.  . collagenase (SANTYL) ointment Apply 1 application topically daily. Apply to sacral wound  . dorzolamidel-timolol (COSOPT PF) 22.3-6.8 MG/ML SOLN ophthalmic solution Place 1 drop into both eyes 2 (two) times daily.  . famotidine (PEPCID) 40 MG tablet Take 40 mg by mouth 2 (two) times daily.  . fexofenadine (ALLEGRA)  180 MG tablet Take 180 mg by mouth daily.  Marland Kitchen latanoprost (XALATAN) 0.005 % ophthalmic solution Place 1 drop into both eyes at bedtime.  Marland Kitchen levothyroxine (SYNTHROID) 100 MCG tablet Take 100 mcg by mouth daily.  Marland Kitchen lisinopril (PRINIVIL,ZESTRIL) 2.5 MG tablet Take 2.5 mg by mouth daily.   . metFORMIN (GLUCOPHAGE-XR) 500 MG 24 hr tablet Take 500 mg by mouth daily with breakfast.   . montelukast (SINGULAIR) 10 MG tablet  Take 10 mg by mouth daily.  . NON FORMULARY Diet: _____ Regular, ___x___Pureed, ______ NAS, _______Consistent Carbohydrate, _______NPO _____Other Special Instructions: Pureed Diet with Honey Thick Liquids  . Nutritional Supplements (NUTRITIONAL SUPPLEMENT PO) Take 1 each by mouth daily. Magic Cup  . potassium chloride SA (K-DUR,KLOR-CON) 20 MEQ tablet Take 20 mEq by mouth 2 (two) times daily.   . sertraline (ZOLOFT) 50 MG tablet Take 50 mg by mouth daily.   No facility-administered encounter medications on file as of 11/03/2019.     SIGNIFICANT DIAGNOSTIC EXAMS  PREVIOUS   10-14-19: ct of head:  1. No evidence of acute intracranial abnormality. 2. ASPECTS is 10. 3. Mild chronic small vessel ischemic disease. 4. Unchanged large left mastoid effusion.  10-14-19: ct angio of head and neck:  1. No emergent large vessel occlusion. 2. Intracranial atherosclerosis with severe bilateral PCA stenoses. No significant proximal anterior circulation stenosis. 3. Widely patent cervical carotid and vertebral arteries. 4. Negative CTP. 5. Aortic Atherosclerosis   10-15-19: 2-d echo:  Left ventricular ejection fraction, by estimation, is 60 to 65%. The  left ventricle has normal function. The left ventricle has no regional  wall motion abnormalities. Left ventricular diastolic parameters were  normal.   10-16-19: MRI of brain:  1. Acute infarct of the left pons. No hemorrhage or mass effect. 2. Generalized atrophy and findings of chronic small vessel disease.   10-18-19: swallow study:  Dysphagia 1 (Puree) solids;Nectar thick liquid   NO NEW EXAMS.    LABS REVIEWED PREVIOUS  10-14-19: wbc 8.6; hgb 13.8; hct 40.5; mcv 90.8 plt 252 glucose 160; bun 14; creat 0.59; k+ 3.7; na++ 132; ca 9.1 liver normal albumin 4.1 vit B 12: 744 free t4: 1.00 hgb a1c 5.8; chol 203; ldl 134; trig 66; hdl 56 10-17-19: wbc 8.2; hgb 11.9; hct 36.9; mcv 93.4 plt 188 glucose 98; bun 7; creat 0.56; k+ 3.0; na++ 137; ca 8.0 liver normal albumin 2.7 10/12/2019: glucose 115; bun 6; creat 0.52; k+ 3.2; na++ 136; ca 8.1 10-24-19: glucose 152; bun 21; creat 0.49; k+ 3.2; na++ 147; ca 8.9  10-28-19: glucose 206; bun 29; creat 0.69; k+ 4.1; na++ 150 ca 9.0 10-31-19: glucose 167; bun 21; creat 0.51; k+ 3.8; na++ 147; ca 8.8   TODAY  11-02-19: glucose 147; bun 16; creat 0.49; k+ 4.0; na++ 141; ca 8.7    Review of Systems  Reason unable to perform ROS: aphasia     Physical Exam Constitutional:      General: She is not in acute distress.    Appearance: She is well-developed. She is not diaphoretic.  Neck:     Thyroid: No thyromegaly.  Cardiovascular:     Rate and Rhythm: Normal rate and regular rhythm.     Pulses: Normal pulses.     Heart sounds: Normal heart sounds.  Pulmonary:     Effort: Pulmonary effort is normal. No respiratory distress.     Breath sounds: Normal breath sounds.  Abdominal:     General: Bowel sounds are normal. There is no distension.     Palpations: Abdomen is soft.     Tenderness: There is no abdominal tenderness.  Musculoskeletal:     Cervical back: Neck supple.     Right lower leg: No edema.     Left lower leg: No edema.     Comments: Right side flaccid hemiplegia   Lymphadenopathy:     Cervical: No cervical adenopathy.  Skin:    General: Skin is warm and  dry.  Neurological:     Mental Status: She is alert. Mental status is at baseline.  Psychiatric:        Mood and Affect: Mood normal.        ASSESSMENT/ PLAN:  TODAY  1. Acute CVA (cerebrovascular accident) 2.  Right hemiplegia 3. Dysphagia due to recent cerebrovascular accident  Will continue current plan of care Will continue current medications If her lethargy continues will need to consider provigil Will continue to monitor her status.   MD is aware of resident's narcotic use and is in agreement with current plan of care. We will attempt to wean resident as appropriate.  Ok Edwards NP [facility]  Contact [phone] Monday through Friday 8am- 5pm  After hours call [phone]

## 2019-11-04 ENCOUNTER — Non-Acute Institutional Stay (SKILLED_NURSING_FACILITY): Payer: PPO | Admitting: Adult Health

## 2019-11-04 ENCOUNTER — Encounter: Payer: Self-pay | Admitting: Adult Health

## 2019-11-04 DIAGNOSIS — E1169 Type 2 diabetes mellitus with other specified complication: Secondary | ICD-10-CM | POA: Diagnosis not present

## 2019-11-04 DIAGNOSIS — E785 Hyperlipidemia, unspecified: Secondary | ICD-10-CM | POA: Diagnosis not present

## 2019-11-04 DIAGNOSIS — E1149 Type 2 diabetes mellitus with other diabetic neurological complication: Secondary | ICD-10-CM

## 2019-11-04 DIAGNOSIS — E039 Hypothyroidism, unspecified: Secondary | ICD-10-CM

## 2019-11-04 NOTE — Progress Notes (Signed)
Location:    [facility] Room Number: 128-P Place of Service:  SNF (31)   CODE STATUS: DNR  Allergies  Allergen Reactions   Sulfamethoxazole-Trimethoprim Rash   Alendronate     Unknown reaction   Cephalexin Other (See Comments)    Unknown reaction Unknown reaction Unknown reaction   Clindamycin/Lincomycin Hives   Creon [Pancreatin] Hives   Doxycycline Other (See Comments)    Unknown reaction Unknown reaction   Florastor [Yeast] Hives   Penicillins Other (See Comments)    Has patient had a PCN reaction causing immediate rash, facial/tongue/throat swelling, SOB or lightheadedness with hypotension: unknown Has patient had a PCN reaction causing severe rash involving mucus membranes or skin necrosis: unknown Has patient had a PCN reaction that required hospitalization unknown Has patient had a PCN reaction occurring within the last 10 years:unknown If all of the above answers are "NO", then may proceed with Cephalosporin use.   Zenpep [Pancrelipase (Lip-Prot-Amyl)] Hives    Chief Complaint  Patient presents with   Routine Visit          Acquired hypothyroidism:     Hyperlipidemia associated with type 2 diabetes mellitus   Type 2 diabetes mellitus with neurologic complications  Weekly follow up for the first 30 days post hospitalization.     HPI:  She is a 86 year old long term resident of this facility being seen for the management of her chronic illnesses: Acquired hypothyroidism:     Hyperlipidemia associated with type 2 diabetes mellitus   Type 2 diabetes mellitus with neurologic complications. She continues to work with therapy as she is able. She did require IVF due to hypernatremia; which has resolved. Her family does not want a feeding tube inserted. There are no reports of pain present. No reports of agitation present.    Past Medical History:  Diagnosis Date   Arthritis    Diabetes mellitus without complication ([place])    "borderline"    Diverticulosis of colon    Dysphagia due to recent cerebrovascular accident (CVA) 10/21/2019   High cholesterol    Hypertension    Hypothyroidism    Pancreatitis     Past Surgical History:  Procedure Laterality Date   ABDOMINAL HYSTERECTOMY     partial   APPENDECTOMY     bladder tac     CHOLECYSTECTOMY  1999   cholelithiasis   COLONOSCOPY  01/2007   Dr Olevia Perches: normal   COLONOSCOPY  12/2000   Dr Deatra Ina- diverticulosis   ESOPHAGOGASTRODUODENOSCOPY     Dr Damaris Hippo cannot remember   EUS N/A 03/24/2012   Dr. Paulita Fujita: suspected passed CBD stone as cause of acute pancreatitis, suspective chronic pancreatitis.    EXCISION OF BREAST BIOPSY     HIP PINNING,CANNULATED Left 01/15/2015   Procedure: CANNULATED HIP PINNING/LEFT;  Surgeon: Renette Butters, MD;  Location: [facility];  Service: Orthopedics;  Laterality: Left;   VEIN SURGERY      Social History   Socioeconomic History   Marital status: Married    Spouse name: Not on file   Number of children: 1   Years of education: Not on file   Highest education level: Not on file  Occupational History   Occupation: retired, tobacco field  Tobacco Use   Smoking status: Never Smoker   Smokeless tobacco: Never Used  Scientific laboratory technician Use: Never used  Substance and Sexual Activity   Alcohol use: No    Alcohol/week: 0.0 standard drinks   Drug  use: No   Sexual activity: Not on file  Other Topics Concern   Not on file  Social History Narrative   Lost 1 son age 71-?MI   1 living son         Social Determinants of Health   Financial Resource Strain:    Difficulty of Paying Living Expenses: Not on file  Food Insecurity:    Worried About Charity fundraiser in the Last Year: Not on file   [employer] of Food in the Last Year: Not on file  Transportation Needs:    Lack of Transportation (Medical): Not on file   Lack of Transportation (Non-Medical): Not on file  Physical Activity:    Days of  Exercise per Week: Not on file   Minutes of Exercise per Session: Not on file  Stress:    Feeling of Stress : Not on file  Social Connections:    Frequency of Communication with Friends and Family: Not on file   Frequency of Social Gatherings with Friends and Family: Not on file   Attends Religious Services: Not on file   Active Member of [place] or Organizations: Not on file   Attends Archivist Meetings: Not on file   Marital Status: Not on file  Intimate Partner Violence:    Fear of Current or Ex-Partner: Not on file   Emotionally Abused: Not on file   Physically Abused: Not on file   Sexually Abused: Not on file   Family History  Problem Relation Age of Onset   Diabetes Father    Diabetes Mother    Cancer Mother        ovarian   Cancer Sister 40       breast   Cancer Sister 20       bone      VITAL SIGNS BP (!) 142/73    Pulse 88    Temp 97.6 F (36.4 C)    Resp 20    Ht 5\' 2"  (1.575 m)    Wt 136 lb 3.2 oz (61.8 kg)    BMI 24.91 kg/m   Outpatient Encounter Medications as of 11/04/2019  Medication Sig   acetaminophen (TYLENOL) 325 MG tablet Take 650 mg by mouth every 6 (six) hours.   amLODipine (NORVASC) 5 MG tablet Take 5 mg by mouth every morning.    aspirin EC 81 MG tablet Take 1 tablet (81 mg total) by mouth at bedtime for 21 days.   atorvastatin (LIPITOR) 40 MG tablet Take 1 tablet (40 mg total) by mouth daily.   carboxymethylcellulose (REFRESH PLUS) 0.5 % SOLN Place 1 drop into both eyes 3 (three) times daily as needed (for dry eyes).    clopidogrel (PLAVIX) 75 MG tablet Take 1 tablet (75 mg total) by mouth daily.   collagenase (SANTYL) ointment Apply 1 application topically daily. 250 unit/gram apply to sacral wound per treatment orders   dorzolamidel-timolol (COSOPT PF) 22.3-6.8 MG/ML SOLN ophthalmic solution Place 1 drop into both eyes 2 (two) times daily. Wait 5 minutes between multiple drops in the same eye   famotidine  (PEPCID) 40 MG tablet Take 40 mg by mouth 2 (two) times daily.   fexofenadine (ALLEGRA) 180 MG tablet Take 180 mg by mouth daily.   latanoprost (XALATAN) 0.005 % ophthalmic solution Place 1 drop into both eyes at bedtime.   levothyroxine (SYNTHROID) 100 MCG tablet Take 100 mcg by mouth daily.   lisinopril (PRINIVIL,ZESTRIL) 2.5 MG tablet Take 2.5 mg by mouth  daily.    metFORMIN (GLUCOPHAGE-XR) 500 MG 24 hr tablet Take 500 mg by mouth daily with breakfast.    montelukast (SINGULAIR) 10 MG tablet Take 10 mg by mouth daily.   NON FORMULARY Diet: _____ Regular, _X__Pureed, __X__ Honey ______ NAS, _______Consistent Carbohydrate, _______NPO _____Other Special Instructions: Pureed Diet with Honey Thick Liquids   Nutritional Supplements (NUTRITIONAL SUPPLEMENT PO) Take 1 each by mouth daily. Magic Cup   potassium chloride SA (K-DUR,KLOR-CON) 20 MEQ tablet Take 20 mEq by mouth 2 (two) times daily.    sertraline (ZOLOFT) 50 MG tablet Take 50 mg by mouth daily.   No facility-administered encounter medications on file as of 11/04/2019.     SIGNIFICANT DIAGNOSTIC EXAMS  PREVIOUS   10-14-19: ct of head:  1. No evidence of acute intracranial abnormality. 2. ASPECTS is 10. 3. Mild chronic small vessel ischemic disease. 4. Unchanged large left mastoid effusion.  10-14-19: ct angio of head and neck:  1. No emergent large vessel occlusion. 2. Intracranial atherosclerosis with severe bilateral PCA stenoses. No significant proximal anterior circulation stenosis. 3. Widely patent cervical carotid and vertebral arteries. 4. Negative CTP. 5. Aortic Atherosclerosis   10-15-19: 2-d echo:  Left ventricular ejection fraction, by estimation, is 60 to 65%. The  left ventricle has normal function. The left ventricle has no regional  wall motion abnormalities. Left ventricular diastolic parameters were  normal.   10-16-19: MRI of brain:  1. Acute infarct of the left pons. No hemorrhage or mass effect. 2.  Generalized atrophy and findings of chronic small vessel disease.   10-18-19: swallow study: Dysphagia 1 (Puree) solids;Nectar thick liquid   NO NEW EXAMS.    LABS REVIEWED PREVIOUS  10-14-19: wbc 8.6; hgb 13.8; hct 40.5; mcv 90.8 plt 252 glucose 160; bun 14; creat 0.59; k+ 3.7; na++ 132; ca 9.1 liver normal albumin 4.1 vit B 12: 744 free t4: 1.00 hgb a1c 5.8; chol 203; ldl 134; trig 66; hdl 56 10-17-19: wbc 8.2; hgb 11.9; hct 36.9; mcv 93.4 plt 188 glucose 98; bun 7; creat 0.56; k+ 3.0; na++ 137; ca 8.0 liver normal albumin 2.7 11/01/2019: glucose 115; bun 6; creat 0.52; k+ 3.2; na++ 136; ca 8.1 10-24-19: glucose 152; bun 21; creat 0.49; k+ 3.2; na++ 147; ca 8.9  10-28-19: glucose 206; bun 29; creat 0.69; k+ 4.1; na++ 150 ca 9.0 10-31-19: glucose 167; bun 21; creat 0.51; k+ 3.8; na++ 147; ca 8.8   TODAY  11-02-19; glucose 147; bun 16; creat 0.49; k+ 4.0; na++ 141; ca 8.7    Review of Systems  Reason unable to perform ROS: aphasia    Physical Exam Constitutional:      General: She is not in acute distress.    Appearance: She is well-developed. She is not diaphoretic.  Neck:     Thyroid: No thyromegaly.  Cardiovascular:     Rate and Rhythm: Normal rate and regular rhythm.     Pulses: Normal pulses.     Heart sounds: Normal heart sounds.  Pulmonary:     Effort: Pulmonary effort is normal. No respiratory distress.     Breath sounds: Normal breath sounds.  Abdominal:     General: Bowel sounds are normal. There is no distension.     Palpations: Abdomen is soft.     Tenderness: There is no abdominal tenderness.  Musculoskeletal:     Cervical back: Neck supple.     Right lower leg: No edema.     Left lower leg: No edema.  Comments: Right side flaccid hemiplegia   Lymphadenopathy:     Cervical: No cervical adenopathy.  Skin:    General: Skin is warm and dry.  Neurological:     Mental Status: She is alert. Mental status is at baseline.  Psychiatric:        Mood and Affect: Mood  normal.      ASSESSMENT/ PLAN:  TODAY  1. Acquired hypothyroidism: is stable free T4: 1.00 will continue synthroid 100 mcg daily  2. Hyperlipidemia associated with type 2 diabetes mellitus is stable LDL 134 will continue lipitor 40 mg daily fish oil daily  3. Type 2 diabetes mellitus with neurologic complications: is stable hgb a1c 5.8 will continue metformin xr 500 mg daily is on asa statin and ace   PREVIOUS  4. Chronic open angle glaucoma of both eyes undetermined stage: is stable will continue xalatan to both eyes   5. Hypokalemia: k+ 4.0 will continue k+ 20 meq twice daily   6.  Acute CVA (cerebrovascular accident/right hemiplegia: is neurological stable: will continue asa 81 mg daily for total 21 days.  plavix 75 mg daily   7. Dysphagia due to recent cerebrovascular accident (CVA): has declined; now requires honey thick liquids. No signs of aspiration present.   8. Hypertension associated with type 2 diabetes mellitus: is stable b/p 142/73 will continue norvasc 5 mg daily lisinopril 2.5 mg daily   9. Chronic non-seasonal allergic rhinitis: is stable will continue allegra 180 mg daily and singulair 10 mg daily   10. GERD without esophagitis: is stable will continue pepcid 40 mg twice daily   11. Chronic pancreatitis unspecified pancreatitis type: is stable with no recent flares. Will monitor       MD is aware of resident's narcotic use and is in agreement with current plan of care. We will attempt to wean resident as appropriate.  Ok Edwards NP [facility]  Contact [phone] Monday through Friday 8am- 5pm  After hours call [phone]

## 2019-11-08 ENCOUNTER — Other Ambulatory Visit: Payer: Self-pay | Admitting: Adult Health

## 2019-11-08 ENCOUNTER — Non-Acute Institutional Stay (SKILLED_NURSING_FACILITY): Payer: PPO | Admitting: Adult Health

## 2019-11-08 ENCOUNTER — Encounter: Payer: Self-pay | Admitting: Adult Health

## 2019-11-08 DIAGNOSIS — R400 Somnolence: Secondary | ICD-10-CM

## 2019-11-08 MED ORDER — MODAFINIL 100 MG PO TABS: 100.0000 mg | ORAL_TABLET | Freq: Every day | ORAL | 0 refills | Status: DC

## 2019-11-08 NOTE — Progress Notes (Signed)
Location:    [facility] Room Number: 128-P Place of Service:  SNF (31)   CODE STATUS: DNR  Allergies  Allergen Reactions  . Sulfamethoxazole-Trimethoprim Rash  . Alendronate     Unknown reaction  . Cephalexin Other (See Comments)    Unknown reaction Unknown reaction Unknown reaction  . Clindamycin/Lincomycin Hives  . Creon [Pancreatin] Hives  . Doxycycline Other (See Comments)    Unknown reaction Unknown reaction  . Florastor [Yeast] Hives  . Penicillins Other (See Comments)    Has patient had a PCN reaction causing immediate rash, facial/tongue/throat swelling, SOB or lightheadedness with hypotension: unknown Has patient had a PCN reaction causing severe rash involving mucus membranes or skin necrosis: unknown Has patient had a PCN reaction that required hospitalization unknown Has patient had a PCN reaction occurring within the last 10 years:unknown If all of the above answers are "NO", then may proceed with Cephalosporin use.  Marland Kitchen Zenpep [Pancrelipase (Lip-Prot-Amyl)] Hives    Chief Complaint  Patient presents with  . Acute Visit    Patient c/o being lethargic     HPI:  She remains lethargic and is unable to fully participate in therapy. She has had her allegra; singulair and zoloft changes to night time to help with day time lethargy. She has been lethargic since her cva.   Past Medical History:  Diagnosis Date  . Arthritis   . Diabetes mellitus without complication ([place])    "borderline"  . Diverticulosis of colon   . Dysphagia due to recent cerebrovascular accident (CVA) 10/21/2019  . High cholesterol   . Hypertension   . Hypothyroidism   . Pancreatitis     Past Surgical History:  Procedure Laterality Date  . ABDOMINAL HYSTERECTOMY     partial  . APPENDECTOMY    . bladder tac    . CHOLECYSTECTOMY  1999   cholelithiasis  . COLONOSCOPY  01/2007   Dr Olevia Perches: normal  . COLONOSCOPY  12/2000   Dr Deatra Ina- diverticulosis  .  ESOPHAGOGASTRODUODENOSCOPY     Dr Damaris Hippo cannot remember  . EUS N/A 03/24/2012   Dr. Paulita Fujita: suspected passed CBD stone as cause of acute pancreatitis, suspective chronic pancreatitis.   Marland Kitchen EXCISION OF BREAST BIOPSY    . HIP PINNING,CANNULATED Left 01/15/2015   Procedure: CANNULATED HIP PINNING/LEFT;  Surgeon: Renette Butters, MD;  Location: [facility];  Service: Orthopedics;  Laterality: Left;  Marland Kitchen VEIN SURGERY      Social History   Socioeconomic History  . Marital status: Married    Spouse name: Not on file  . Number of children: 1  . Years of education: Not on file  . Highest education level: Not on file  Occupational History  . Occupation: retired, tobacco field  Tobacco Use  . Smoking status: Never Smoker  . Smokeless tobacco: Never Used  Vaping Use  . Vaping Use: Never used  Substance and Sexual Activity  . Alcohol use: No    Alcohol/week: 0.0 standard drinks  . Drug use: No  . Sexual activity: Not on file  Other Topics Concern  . Not on file  Social History Narrative   Lost 1 son age 55-?MI   1 living son         Social Determinants of Health   Financial Resource Strain:   . Difficulty of Paying Living Expenses: Not on file  Food Insecurity:   . Worried About Charity fundraiser in the Last Year: Not on file  . Ran  Out of Food in the Last Year: Not on file  Transportation Needs:   . Lack of Transportation (Medical): Not on file  . Lack of Transportation (Non-Medical): Not on file  Physical Activity:   . Days of Exercise per Week: Not on file  . Minutes of Exercise per Session: Not on file  Stress:   . Feeling of Stress : Not on file  Social Connections:   . Frequency of Communication with Friends and Family: Not on file  . Frequency of Social Gatherings with Friends and Family: Not on file  . Attends Religious Services: Not on file  . Active Member of Clubs or Organizations: Not on file  . Attends Archivist Meetings: Not on file  . Marital  Status: Not on file  Intimate Partner Violence:   . Fear of Current or Ex-Partner: Not on file  . Emotionally Abused: Not on file  . Physically Abused: Not on file  . Sexually Abused: Not on file   Family History  Problem Relation Age of Onset  . Diabetes Father   . Diabetes Mother   . Cancer Mother        ovarian  . Cancer Sister 55       breast  . Cancer Sister 75       bone      VITAL SIGNS BP 126/68   Pulse 85   Temp 98 F (36.7 C)   Resp 20   Ht 5\' 2"  (1.575 m)   Wt 132 lb (59.9 kg)   BMI 24.14 kg/m   Outpatient Encounter Medications as of 11/08/2019  Medication Sig  . acetaminophen (TYLENOL) 325 MG tablet Take 650 mg by mouth every 6 (six) hours.  Marland Kitchen amLODipine (NORVASC) 5 MG tablet Take 5 mg by mouth every morning.   Marland Kitchen atorvastatin (LIPITOR) 40 MG tablet Take 1 tablet (40 mg total) by mouth daily.  . carboxymethylcellulose (REFRESH PLUS) 0.5 % SOLN Place 1 drop into both eyes 3 (three) times daily as needed (for dry eyes).   . clopidogrel (PLAVIX) 75 MG tablet Take 1 tablet (75 mg total) by mouth daily.  . collagenase (SANTYL) ointment Apply 1 application topically daily. 250 unit/gram apply to sacral wound per treatment orders  . dorzolamidel-timolol (COSOPT PF) 22.3-6.8 MG/ML SOLN ophthalmic solution Place 1 drop into both eyes 2 (two) times daily. Wait 5 minutes between multiple drops in the same eye  . famotidine (PEPCID) 40 MG tablet Take 40 mg by mouth 2 (two) times daily.  . fexofenadine (ALLEGRA) 180 MG tablet Take 180 mg by mouth daily.  Marland Kitchen latanoprost (XALATAN) 0.005 % ophthalmic solution Place 1 drop into both eyes at bedtime.  Marland Kitchen levothyroxine (SYNTHROID) 100 MCG tablet Take 100 mcg by mouth daily.  Marland Kitchen lisinopril (PRINIVIL,ZESTRIL) 2.5 MG tablet Take 2.5 mg by mouth daily.   . metFORMIN (GLUCOPHAGE-XR) 500 MG 24 hr tablet Take 500 mg by mouth daily with breakfast.   . modafinil (PROVIGIL) 100 MG tablet Take 1 tablet (100 mg total) by mouth daily.  .  montelukast (SINGULAIR) 10 MG tablet Take 10 mg by mouth daily.  . NON FORMULARY Diet: _____ Regular, _X__Pureed, __X__ Honey ______ NAS, _______Consistent Carbohydrate, _______NPO _____Other Special Instructions: Pureed Diet with Honey Thick Liquids  . Nutritional Supplements (NUTRITIONAL SUPPLEMENT PO) Take 1 each by mouth daily. Magic Cup  . potassium chloride SA (K-DUR,KLOR-CON) 20 MEQ tablet Take 20 mEq by mouth 2 (two) times daily.   . sertraline (ZOLOFT) 50 MG  tablet Take 50 mg by mouth daily.  . [DISCONTINUED] aspirin EC 81 MG tablet Take 1 tablet (81 mg total) by mouth at bedtime for 21 days.   No facility-administered encounter medications on file as of 11/08/2019.     SIGNIFICANT DIAGNOSTIC EXAMS   PREVIOUS   10-14-19: ct of head:  1. No evidence of acute intracranial abnormality. 2. ASPECTS is 10. 3. Mild chronic small vessel ischemic disease. 4. Unchanged large left mastoid effusion.  10-14-19: ct angio of head and neck:  1. No emergent large vessel occlusion. 2. Intracranial atherosclerosis with severe bilateral PCA stenoses. No significant proximal anterior circulation stenosis. 3. Widely patent cervical carotid and vertebral arteries. 4. Negative CTP. 5. Aortic Atherosclerosis   10-15-19: 2-d echo:  Left ventricular ejection fraction, by estimation, is 60 to 65%. The  left ventricle has normal function. The left ventricle has no regional  wall motion abnormalities. Left ventricular diastolic parameters were  normal.   10-16-19: MRI of brain:  1. Acute infarct of the left pons. No hemorrhage or mass effect. 2. Generalized atrophy and findings of chronic small vessel disease.   10-18-19: swallow study: Dysphagia 1 (Puree) solids;Nectar thick liquid   NO NEW EXAMS.    LABS REVIEWED PREVIOUS  10-14-19: wbc 8.6; hgb 13.8; hct 40.5; mcv 90.8 plt 252 glucose 160; bun 14; creat 0.59; k+ 3.7; na++ 132; ca 9.1 liver normal albumin 4.1 vit B 12: 744 free t4: 1.00 hgb a1c 5.8;  chol 203; ldl 134; trig 66; hdl 56 10-17-19: wbc 8.2; hgb 11.9; hct 36.9; mcv 93.4 plt 188 glucose 98; bun 7; creat 0.56; k+ 3.0; na++ 137; ca 8.0 liver normal albumin 2.7 11/01/2019: glucose 115; bun 6; creat 0.52; k+ 3.2; na++ 136; ca 8.1 10-24-19: glucose 152; bun 21; creat 0.49; k+ 3.2; na++ 147; ca 8.9  10-28-19: glucose 206; bun 29; creat 0.69; k+ 4.1; na++ 150 ca 9.0 10-31-19: glucose 167; bun 21; creat 0.51; k+ 3.8; na++ 147; ca 8.8  11-02-19; glucose 147; bun 16; creat 0.49; k+ 4.0; na++ 141; ca 8.7   NO NEW LABS.   Review of Systems  Reason unable to perform ROS: aphasia    Physical Exam Constitutional:      General: She is not in acute distress.    Appearance: She is well-developed. She is not diaphoretic.  Neck:     Thyroid: No thyromegaly.  Cardiovascular:     Rate and Rhythm: Normal rate and regular rhythm.     Pulses: Normal pulses.     Heart sounds: Normal heart sounds.  Pulmonary:     Effort: Pulmonary effort is normal. No respiratory distress.     Breath sounds: Normal breath sounds.  Abdominal:     General: Bowel sounds are normal. There is no distension.     Palpations: Abdomen is soft.     Tenderness: There is no abdominal tenderness.  Musculoskeletal:     Cervical back: Neck supple.     Right lower leg: No edema.     Left lower leg: No edema.     Comments: Right side flaccid hemiplegia    Lymphadenopathy:     Cervical: No cervical adenopathy.  Skin:    General: Skin is warm and dry.  Neurological:     Mental Status: She is alert. Mental status is at baseline.  Psychiatric:        Mood and Affect: Mood normal.       ASSESSMENT/ PLAN:  TODAY  1. Somnolence Is  worse:  Will begin provigil 100 mg daily will continue to monitor her status.      MD is aware of resident's narcotic use and is in agreement with current plan of care. We will attempt to wean resident as appropriate.  Ok Edwards NP [facility]  Contact [phone] Monday  through Friday 8am- 5pm  After hours call [phone]

## 2019-11-09 DIAGNOSIS — R400 Somnolence: Secondary | ICD-10-CM | POA: Insufficient documentation

## 2019-11-10 ENCOUNTER — Encounter: Payer: Self-pay | Admitting: Adult Health

## 2019-11-10 ENCOUNTER — Non-Acute Institutional Stay (SKILLED_NURSING_FACILITY): Payer: PPO | Admitting: Adult Health

## 2019-11-10 DIAGNOSIS — I639 Cerebral infarction, unspecified: Secondary | ICD-10-CM

## 2019-11-10 DIAGNOSIS — K861 Other chronic pancreatitis: Secondary | ICD-10-CM

## 2019-11-10 DIAGNOSIS — I69391 Dysphagia following cerebral infarction: Secondary | ICD-10-CM

## 2019-11-10 NOTE — Progress Notes (Signed)
Location:    [facility] Room Number: 128/P Place of Service:  SNF (31)   CODE STATUS: DNR  Allergies  Allergen Reactions  . Sulfamethoxazole-Trimethoprim Rash  . Alendronate     Unknown reaction  . Cephalexin Other (See Comments)    Unknown reaction Unknown reaction Unknown reaction  . Clindamycin/Lincomycin Hives  . Creon [Pancreatin] Hives  . Doxycycline Other (See Comments)    Unknown reaction Unknown reaction  . Florastor [Yeast] Hives  . Penicillins Other (See Comments)    Has patient had a PCN reaction causing immediate rash, facial/tongue/throat swelling, SOB or lightheadedness with hypotension: unknown Has patient had a PCN reaction causing severe rash involving mucus membranes or skin necrosis: unknown Has patient had a PCN reaction that required hospitalization unknown Has patient had a PCN reaction occurring within the last 10 years:unknown If all of the above answers are "NO", then may proceed with Cephalosporin use.  Marland Kitchen Zenpep [Pancrelipase (Lip-Prot-Amyl)] Hives    Chief Complaint  Patient presents with  . Acute Visit    Care Plan Meeting    HPI:  We have come together for her care plan meeting. No BIMS mood 0/30. She is more awake today; was started on provigil to help with her alertness. There are no reports of falls. She is dependent for her adls is incontinent of bladder and bowel. Her appetite is fair at 50-75% her current weight is 132 down from 136 pounds. She remains on honey thick pureed diet. She continues to be followed for her chronic illnesses including: Dysphagia due to recent cerebrovascular accident (CVA)    Acute CVA (cerebrovascular accident)  Chronic pancreatitis unspecified pancreatitis type  Past Medical History:  Diagnosis Date  . Arthritis   . Diabetes mellitus without complication ([place])    "borderline"  . Diverticulosis of colon   . Dysphagia due to recent cerebrovascular accident (CVA) 10/21/2019  . High  cholesterol   . Hypertension   . Hypothyroidism   . Pancreatitis     Past Surgical History:  Procedure Laterality Date  . ABDOMINAL HYSTERECTOMY     partial  . APPENDECTOMY    . bladder tac    . CHOLECYSTECTOMY  1999   cholelithiasis  . COLONOSCOPY  01/2007   Dr Olevia Perches: normal  . COLONOSCOPY  12/2000   Dr Deatra Ina- diverticulosis  . ESOPHAGOGASTRODUODENOSCOPY     Dr Damaris Hippo cannot remember  . EUS N/A 03/24/2012   Dr. Paulita Fujita: suspected passed CBD stone as cause of acute pancreatitis, suspective chronic pancreatitis.   Marland Kitchen EXCISION OF BREAST BIOPSY    . HIP PINNING,CANNULATED Left 01/15/2015   Procedure: CANNULATED HIP PINNING/LEFT;  Surgeon: Renette Butters, MD;  Location: [facility];  Service: Orthopedics;  Laterality: Left;  Marland Kitchen VEIN SURGERY      Social History   Socioeconomic History  . Marital status: Married    Spouse name: Not on file  . Number of children: 1  . Years of education: Not on file  . Highest education level: Not on file  Occupational History  . Occupation: retired, tobacco field  Tobacco Use  . Smoking status: Never Smoker  . Smokeless tobacco: Never Used  Vaping Use  . Vaping Use: Never used  Substance and Sexual Activity  . Alcohol use: No    Alcohol/week: 0.0 standard drinks  . Drug use: No  . Sexual activity: Not on file  Other Topics Concern  . Not on file  Social History Narrative   Lost 1  son age 77-?MI   1 living son         Social Determinants of Health   Financial Resource Strain:   . Difficulty of Paying Living Expenses: Not on file  Food Insecurity:   . Worried About Charity fundraiser in the Last Year: Not on file  . Ran Out of Food in the Last Year: Not on file  Transportation Needs:   . Lack of Transportation (Medical): Not on file  . Lack of Transportation (Non-Medical): Not on file  Physical Activity:   . Days of Exercise per Week: Not on file  . Minutes of Exercise per Session: Not on file  Stress:   . Feeling of Stress  : Not on file  Social Connections:   . Frequency of Communication with Friends and Family: Not on file  . Frequency of Social Gatherings with Friends and Family: Not on file  . Attends Religious Services: Not on file  . Active Member of Clubs or Organizations: Not on file  . Attends Archivist Meetings: Not on file  . Marital Status: Not on file  Intimate Partner Violence:   . Fear of Current or Ex-Partner: Not on file  . Emotionally Abused: Not on file  . Physically Abused: Not on file  . Sexually Abused: Not on file   Family History  Problem Relation Age of Onset  . Diabetes Father   . Diabetes Mother   . Cancer Mother        ovarian  . Cancer Sister 58       breast  . Cancer Sister 77       bone      VITAL SIGNS BP (!) 183/87   Pulse 88   Temp 97.6 F (36.4 C)   Resp 20   Ht 5\' 2"  (1.575 m)   Wt 132 lb 3.2 oz (60 kg)   SpO2 94%   BMI 24.18 kg/m   Outpatient Encounter Medications as of 11/10/2019  Medication Sig  . acetaminophen (TYLENOL) 325 MG tablet Take 650 mg by mouth every 6 (six) hours.  Marland Kitchen amLODipine (NORVASC) 5 MG tablet Take 5 mg by mouth every morning.   Marland Kitchen atorvastatin (LIPITOR) 40 MG tablet Take 1 tablet (40 mg total) by mouth daily.  . carboxymethylcellulose (REFRESH PLUS) 0.5 % SOLN Place 1 drop into both eyes 3 (three) times daily as needed (for dry eyes).   . clopidogrel (PLAVIX) 75 MG tablet Take 1 tablet (75 mg total) by mouth daily.  . collagenase (SANTYL) ointment Apply 1 application topically daily. 250 unit/gram apply to sacral wound per treatment orders  . dorzolamidel-timolol (COSOPT PF) 22.3-6.8 MG/ML SOLN ophthalmic solution Place 1 drop into both eyes 2 (two) times daily. Wait 5 minutes between multiple drops in the same eye  . famotidine (PEPCID) 40 MG tablet Take 40 mg by mouth 2 (two) times daily.  . fexofenadine (ALLEGRA) 180 MG tablet Take 180 mg by mouth daily.  Marland Kitchen latanoprost (XALATAN) 0.005 % ophthalmic solution Place 1  drop into both eyes at bedtime.  Marland Kitchen levothyroxine (SYNTHROID) 100 MCG tablet Take 100 mcg by mouth daily.  Marland Kitchen lisinopril (PRINIVIL,ZESTRIL) 2.5 MG tablet Take 2.5 mg by mouth daily.   . metFORMIN (GLUCOPHAGE-XR) 500 MG 24 hr tablet Take 500 mg by mouth daily with breakfast.   . montelukast (SINGULAIR) 10 MG tablet Take 10 mg by mouth daily.  . NON FORMULARY Diet: _____ Regular, _X__Pureed, __X__ Honey ______ NAS, _______Consistent Carbohydrate, _______NPO  _____Other Special Instructions: Pureed Diet with Honey Thick Liquids  . Nutritional Supplements (NUTRITIONAL SUPPLEMENT PO) Take 1 each by mouth daily. Magic Cup  . potassium chloride SA (K-DUR,KLOR-CON) 20 MEQ tablet Take 20 mEq by mouth 2 (two) times daily.   . sertraline (ZOLOFT) 50 MG tablet Take 50 mg by mouth daily.  . [DISCONTINUED] modafinil (PROVIGIL) 100 MG tablet Take 1 tablet (100 mg total) by mouth daily.   No facility-administered encounter medications on file as of 11/10/2019.     SIGNIFICANT DIAGNOSTIC EXAMS   PREVIOUS   10-14-19: ct of head:  1. No evidence of acute intracranial abnormality. 2. ASPECTS is 10. 3. Mild chronic small vessel ischemic disease. 4. Unchanged large left mastoid effusion.  10-14-19: ct angio of head and neck:  1. No emergent large vessel occlusion. 2. Intracranial atherosclerosis with severe bilateral PCA stenoses. No significant proximal anterior circulation stenosis. 3. Widely patent cervical carotid and vertebral arteries. 4. Negative CTP. 5. Aortic Atherosclerosis   10-15-19: 2-d echo:  Left ventricular ejection fraction, by estimation, is 60 to 65%. The  left ventricle has normal function. The left ventricle has no regional  wall motion abnormalities. Left ventricular diastolic parameters were  normal.   10-16-19: MRI of brain:  1. Acute infarct of the left pons. No hemorrhage or mass effect. 2. Generalized atrophy and findings of chronic small vessel disease.   10-18-19: swallow study:  Dysphagia 1 (Puree) solids;Nectar thick liquid   NO NEW EXAMS.    LABS REVIEWED PREVIOUS  10-14-19: wbc 8.6; hgb 13.8; hct 40.5; mcv 90.8 plt 252 glucose 160; bun 14; creat 0.59; k+ 3.7; na++ 132; ca 9.1 liver normal albumin 4.1 vit B 12: 744 free t4: 1.00 hgb a1c 5.8; chol 203; ldl 134; trig 66; hdl 56 10-17-19: wbc 8.2; hgb 11.9; hct 36.9; mcv 93.4 plt 188 glucose 98; bun 7; creat 0.56; k+ 3.0; na++ 137; ca 8.0 liver normal albumin 2.7 10/26/2019: glucose 115; bun 6; creat 0.52; k+ 3.2; na++ 136; ca 8.1 10-24-19: glucose 152; bun 21; creat 0.49; k+ 3.2; na++ 147; ca 8.9  10-28-19: glucose 206; bun 29; creat 0.69; k+ 4.1; na++ 150 ca 9.0 10-31-19: glucose 167; bun 21; creat 0.51; k+ 3.8; na++ 147; ca 8.8  11-02-19; glucose 147; bun 16; creat 0.49; k+ 4.0; na++ 141; ca 8.7   NO NEW LABS.   Review of Systems  Reason unable to perform ROS: aphasia     Physical Exam Constitutional:      General: She is not in acute distress.    Appearance: She is well-developed. She is not diaphoretic.  Neck:     Thyroid: No thyromegaly.  Cardiovascular:     Rate and Rhythm: Normal rate and regular rhythm.     Pulses: Normal pulses.     Heart sounds: Normal heart sounds.  Pulmonary:     Effort: Pulmonary effort is normal. No respiratory distress.     Breath sounds: Normal breath sounds.  Abdominal:     General: Bowel sounds are normal. There is no distension.     Palpations: Abdomen is soft.     Tenderness: There is no abdominal tenderness.  Musculoskeletal:     Cervical back: Neck supple.     Right lower leg: No edema.     Left lower leg: No edema.     Comments: Right side flaccid hemiplegia     Lymphadenopathy:     Cervical: No cervical adenopathy.  Skin:    General: Skin is  warm and dry.  Neurological:     Mental Status: She is alert. Mental status is at baseline.  Psychiatric:        Mood and Affect: Mood normal.      ASSESSMENT/ PLAN:  TODAY  1. Dysphagia due to recent cerebrovascular  accident (CVA)  2. Acute CVA (cerebrovascular accident) 3. Chronic pancreatitis unspecified pancreatitis type  Will continue therapy as directed Will continue current medications Will continue current plan of care Her goal is for long term placement at this time.   MD is aware of resident's narcotic use and is in agreement with current plan of care. We will attempt to wean resident as appropriate.  Ok Edwards NP [facility]  Contact [phone] Monday through Friday 8am- 5pm  After hours call [phone]

## 2019-11-11 ENCOUNTER — Encounter: Payer: Self-pay | Admitting: Adult Health

## 2019-11-11 ENCOUNTER — Non-Acute Institutional Stay (SKILLED_NURSING_FACILITY): Payer: PPO | Admitting: Adult Health

## 2019-11-11 DIAGNOSIS — E876 Hypokalemia: Secondary | ICD-10-CM

## 2019-11-11 DIAGNOSIS — I639 Cerebral infarction, unspecified: Secondary | ICD-10-CM | POA: Diagnosis not present

## 2019-11-11 DIAGNOSIS — G8191 Hemiplegia, unspecified affecting right dominant side: Secondary | ICD-10-CM

## 2019-11-11 DIAGNOSIS — H401134 Primary open-angle glaucoma, bilateral, indeterminate stage: Secondary | ICD-10-CM | POA: Diagnosis not present

## 2019-11-11 NOTE — Progress Notes (Signed)
Location:    [facility] Room Number: 128/P Place of Service:  SNF (31)   CODE STATUS: DNR  Allergies  Allergen Reactions  . Sulfamethoxazole-Trimethoprim Rash  . Alendronate     Unknown reaction  . Cephalexin Other (See Comments)    Unknown reaction Unknown reaction Unknown reaction  . Clindamycin/Lincomycin Hives  . Creon [Pancreatin] Hives  . Doxycycline Other (See Comments)    Unknown reaction Unknown reaction  . Florastor [Yeast] Hives  . Penicillins Other (See Comments)    Has patient had a PCN reaction causing immediate rash, facial/tongue/throat swelling, SOB or lightheadedness with hypotension: unknown Has patient had a PCN reaction causing severe rash involving mucus membranes or skin necrosis: unknown Has patient had a PCN reaction that required hospitalization unknown Has patient had a PCN reaction occurring within the last 10 years:unknown If all of the above answers are "NO", then may proceed with Cephalosporin use.  Marland Kitchen Zenpep [Pancrelipase (Lip-Prot-Amyl)] Hives    Chief Complaint  Patient presents with  . Short Term Rehab (STR)            Chronic open angle glaucoma of both eyes undetermined stage:    Hypokalemia: Acute CVA (cerebrovascular accident) / right hemiplegia    Weekly follow up for the first 30 days post hospitalization     HPI:  She is a long term resident of this facility being seen for the management of her chronic illnesses: Chronic open angle glaucoma of both eyes undetermined stage:    Hypokalemia: Acute CVA (cerebrovascular accident) / right hemiplegia. Is less lethargic since starting provigil. She is confused. There are no reports of uncontrolled pain.   Past Medical History:  Diagnosis Date  . Arthritis   . Diabetes mellitus without complication ([place])    "borderline"  . Diverticulosis of colon   . Dysphagia due to recent cerebrovascular accident (CVA) 10/21/2019  . High cholesterol   . Hypertension   .  Hypothyroidism   . Pancreatitis     Past Surgical History:  Procedure Laterality Date  . ABDOMINAL HYSTERECTOMY     partial  . APPENDECTOMY    . bladder tac    . CHOLECYSTECTOMY  1999   cholelithiasis  . COLONOSCOPY  01/2007   Dr Olevia Perches: normal  . COLONOSCOPY  12/2000   Dr Deatra Ina- diverticulosis  . ESOPHAGOGASTRODUODENOSCOPY     Dr Damaris Hippo cannot remember  . EUS N/A 03/24/2012   Dr. Paulita Fujita: suspected passed CBD stone as cause of acute pancreatitis, suspective chronic pancreatitis.   Marland Kitchen EXCISION OF BREAST BIOPSY    . HIP PINNING,CANNULATED Left 01/15/2015   Procedure: CANNULATED HIP PINNING/LEFT;  Surgeon: Renette Butters, MD;  Location: [facility];  Service: Orthopedics;  Laterality: Left;  Marland Kitchen VEIN SURGERY      Social History   Socioeconomic History  . Marital status: Married    Spouse name: Not on file  . Number of children: 1  . Years of education: Not on file  . Highest education level: Not on file  Occupational History  . Occupation: retired, tobacco field  Tobacco Use  . Smoking status: Never Smoker  . Smokeless tobacco: Never Used  Vaping Use  . Vaping Use: Never used  Substance and Sexual Activity  . Alcohol use: No    Alcohol/week: 0.0 standard drinks  . Drug use: No  . Sexual activity: Not on file  Other Topics Concern  . Not on file  Social History Narrative   Lost 1 son  age 21-?MI   1 living son         Social Determinants of Health   Financial Resource Strain:   . Difficulty of Paying Living Expenses: Not on file  Food Insecurity:   . Worried About Charity fundraiser in the Last Year: Not on file  . Ran Out of Food in the Last Year: Not on file  Transportation Needs:   . Lack of Transportation (Medical): Not on file  . Lack of Transportation (Non-Medical): Not on file  Physical Activity:   . Days of Exercise per Week: Not on file  . Minutes of Exercise per Session: Not on file  Stress:   . Feeling of Stress : Not on file  Social Connections:    . Frequency of Communication with Friends and Family: Not on file  . Frequency of Social Gatherings with Friends and Family: Not on file  . Attends Religious Services: Not on file  . Active Member of Clubs or Organizations: Not on file  . Attends Archivist Meetings: Not on file  . Marital Status: Not on file  Intimate Partner Violence:   . Fear of Current or Ex-Partner: Not on file  . Emotionally Abused: Not on file  . Physically Abused: Not on file  . Sexually Abused: Not on file   Family History  Problem Relation Age of Onset  . Diabetes Father   . Diabetes Mother   . Cancer Mother        ovarian  . Cancer Sister 79       breast  . Cancer Sister 3       bone      VITAL SIGNS BP 136/78   Pulse 83   Temp 97.9 F (36.6 C)   Resp 20   Ht 5\' 2"  (1.575 m)   Wt 132 lb 3.2 oz (60 kg)   SpO2 95%   BMI 24.18 kg/m   Outpatient Encounter Medications as of 11/11/2019  Medication Sig  . acetaminophen (TYLENOL) 325 MG tablet Take 650 mg by mouth every 6 (six) hours.  Marland Kitchen amLODipine (NORVASC) 5 MG tablet Take 5 mg by mouth every morning.   Marland Kitchen atorvastatin (LIPITOR) 40 MG tablet Take 1 tablet (40 mg total) by mouth daily.  . carboxymethylcellulose (REFRESH PLUS) 0.5 % SOLN Place 1 drop into both eyes 3 (three) times daily as needed (for dry eyes).   . clopidogrel (PLAVIX) 75 MG tablet Take 1 tablet (75 mg total) by mouth daily.  . collagenase (SANTYL) ointment Apply 1 application topically daily. 250 unit/gram apply to sacral wound per treatment orders  . dorzolamidel-timolol (COSOPT PF) 22.3-6.8 MG/ML SOLN ophthalmic solution Place 1 drop into both eyes 2 (two) times daily. Wait 5 minutes between multiple drops in the same eye  . famotidine (PEPCID) 40 MG tablet Take 40 mg by mouth 2 (two) times daily.  . fexofenadine (ALLEGRA) 180 MG tablet Take 180 mg by mouth daily.  Marland Kitchen latanoprost (XALATAN) 0.005 % ophthalmic solution Place 1 drop into both eyes at bedtime.  Marland Kitchen  levothyroxine (SYNTHROID) 100 MCG tablet Take 100 mcg by mouth daily.  Marland Kitchen lisinopril (PRINIVIL,ZESTRIL) 2.5 MG tablet Take 2.5 mg by mouth daily.   . metFORMIN (GLUCOPHAGE-XR) 500 MG 24 hr tablet Take 500 mg by mouth daily with breakfast.   . modafinil (PROVIGIL) 100 MG tablet Take 100 mg by mouth daily. Special Instructions: somnolence  . montelukast (SINGULAIR) 10 MG tablet Take 10 mg by mouth daily.  Marland Kitchen  NON FORMULARY Diet: _____ Regular, _X__Pureed, __X__ Honey ______ NAS, _______Consistent Carbohydrate, _______NPO _____Other Special Instructions: Pureed Diet with Honey Thick Liquids  . Nutritional Supplements (NUTRITIONAL SUPPLEMENT PO) Take 1 each by mouth daily. Magic Cup  . potassium chloride SA (K-DUR,KLOR-CON) 20 MEQ tablet Take 20 mEq by mouth 2 (two) times daily.   . sertraline (ZOLOFT) 50 MG tablet Take 50 mg by mouth daily.   No facility-administered encounter medications on file as of 11/11/2019.     SIGNIFICANT DIAGNOSTIC EXAMS   PREVIOUS   10-14-19: ct of head:  1. No evidence of acute intracranial abnormality. 2. ASPECTS is 10. 3. Mild chronic small vessel ischemic disease. 4. Unchanged large left mastoid effusion.  10-14-19: ct angio of head and neck:  1. No emergent large vessel occlusion. 2. Intracranial atherosclerosis with severe bilateral PCA stenoses. No significant proximal anterior circulation stenosis. 3. Widely patent cervical carotid and vertebral arteries. 4. Negative CTP. 5. Aortic Atherosclerosis   10-15-19: 2-d echo:  Left ventricular ejection fraction, by estimation, is 60 to 65%. The  left ventricle has normal function. The left ventricle has no regional  wall motion abnormalities. Left ventricular diastolic parameters were  normal.   10-16-19: MRI of brain:  1. Acute infarct of the left pons. No hemorrhage or mass effect. 2. Generalized atrophy and findings of chronic small vessel disease.   10-18-19: swallow study: Dysphagia 1 (Puree) solids;Nectar  thick liquid   NO NEW EXAMS.    LABS REVIEWED PREVIOUS  10-14-19: wbc 8.6; hgb 13.8; hct 40.5; mcv 90.8 plt 252 glucose 160; bun 14; creat 0.59; k+ 3.7; na++ 132; ca 9.1 liver normal albumin 4.1 vit B 12: 744 free t4: 1.00 hgb a1c 5.8; chol 203; ldl 134; trig 66; hdl 56 10-17-19: wbc 8.2; hgb 11.9; hct 36.9; mcv 93.4 plt 188 glucose 98; bun 7; creat 0.56; k+ 3.0; na++ 137; ca 8.0 liver normal albumin 2.7 10/14/2019: glucose 115; bun 6; creat 0.52; k+ 3.2; na++ 136; ca 8.1 10-24-19: glucose 152; bun 21; creat 0.49; k+ 3.2; na++ 147; ca 8.9  10-28-19: glucose 206; bun 29; creat 0.69; k+ 4.1; na++ 150 ca 9.0 10-31-19: glucose 167; bun 21; creat 0.51; k+ 3.8; na++ 147; ca 8.8  11-02-19; glucose 147; bun 16; creat 0.49; k+ 4.0; na++ 141; ca 8.7  NO NEW LABS.    Review of Systems  Reason unable to perform ROS: aphasia       Physical Exam Constitutional:      General: She is not in acute distress.    Appearance: She is well-developed. She is not diaphoretic.  Neck:     Thyroid: No thyromegaly.  Cardiovascular:     Rate and Rhythm: Normal rate and regular rhythm.     Pulses: Normal pulses.     Heart sounds: Normal heart sounds.  Pulmonary:     Effort: Pulmonary effort is normal. No respiratory distress.     Breath sounds: Normal breath sounds.  Abdominal:     General: Bowel sounds are normal. There is no distension.     Palpations: Abdomen is soft.     Tenderness: There is no abdominal tenderness.  Musculoskeletal:     Cervical back: Neck supple.     Right lower leg: No edema.     Left lower leg: No edema.     Comments: Right side flaccid hemiplegia      Lymphadenopathy:     Cervical: No cervical adenopathy.  Skin:    General: Skin is warm  and dry.  Neurological:     Mental Status: She is alert. Mental status is at baseline.  Psychiatric:        Mood and Affect: Mood normal.      ASSESSMENT/ PLAN:  TODAY  1. Chronic open angle glaucoma of both eyes undetermined stage: is  stable will continue xalatan to both eyes  2. Hypokalemia: stable k+ 4.0 will continue k+ 20 meq twice daily   3. Acute CVA (cerebrovascular accident) / right hemiplegia: is stable will continue plavix 75 mg daily    PREVIOUS  4. Dysphagia due to recent cerebrovascular accident (CVA): has declined; now requires honey thick liquids. No signs of aspiration present.   5. Hypertension associated with type 2 diabetes mellitus: is stable b/p 136/78 will continue norvasc 5 mg daily lisinopril 2.5 mg daily   6. Chronic non-seasonal allergic rhinitis: is stable will continue allegra 180 mg daily and singulair 10 mg daily   7. GERD without esophagitis: is stable will continue pepcid 40 mg twice daily   8. Chronic pancreatitis unspecified pancreatitis type: is stable with no recent flares. Will monitor   9. Acquired hypothyroidism: is stable free T4: 1.00 will continue synthroid 100 mcg daily  10. Hyperlipidemia associated with type 2 diabetes mellitus is stable LDL 134 will continue lipitor 40 mg daily fish oil daily  11. Type 2 diabetes mellitus with neurologic complications: is stable hgb a1c 5.8 will continue metformin xr 500 mg daily is on asa statin and ace  12. Somnolence: is more alert with confusion present; will continue provigil 100 mg daily       MD is aware of resident's narcotic use and is in agreement with current plan of care. We will attempt to wean resident as appropriate.  Ok Edwards NP [facility]  Contact [phone] Monday through Friday 8am- 5pm  After hours call [phone]

## 2019-11-11 DEATH — deceased

## 2019-11-14 ENCOUNTER — Encounter: Payer: Self-pay | Admitting: Adult Health

## 2019-11-14 ENCOUNTER — Non-Acute Institutional Stay (SKILLED_NURSING_FACILITY): Payer: PPO | Admitting: Adult Health

## 2019-11-14 ENCOUNTER — Other Ambulatory Visit: Payer: Self-pay | Admitting: Adult Health

## 2019-11-14 DIAGNOSIS — R627 Adult failure to thrive: Secondary | ICD-10-CM

## 2019-11-14 DIAGNOSIS — M79621 Pain in right upper arm: Secondary | ICD-10-CM | POA: Diagnosis not present

## 2019-11-14 DIAGNOSIS — I69391 Dysphagia following cerebral infarction: Secondary | ICD-10-CM

## 2019-11-14 DIAGNOSIS — I639 Cerebral infarction, unspecified: Secondary | ICD-10-CM | POA: Diagnosis not present

## 2019-11-14 DIAGNOSIS — J690 Pneumonitis due to inhalation of food and vomit: Secondary | ICD-10-CM

## 2019-11-14 DIAGNOSIS — R2231 Localized swelling, mass and lump, right upper limb: Secondary | ICD-10-CM | POA: Diagnosis not present

## 2019-11-14 MED ORDER — HYDROCOD POLST-CPM POLST ER 10-8 MG/5ML PO SUER
5.0000 mL | Freq: Two times a day (BID) | ORAL | 0 refills | Status: AC
Start: 2019-11-14 — End: ?

## 2019-11-14 NOTE — Progress Notes (Signed)
Location:    [facility] Room Number: 128/P Place of Service:  SNF (31)   CODE STATUS: DNR  Allergies  Allergen Reactions  . Sulfamethoxazole-Trimethoprim Rash  . Alendronate     Unknown reaction  . Cephalexin Other (See Comments)    Unknown reaction Unknown reaction Unknown reaction  . Clindamycin/Lincomycin Hives  . Creon [Pancreatin] Hives  . Doxycycline Other (See Comments)    Unknown reaction Unknown reaction  . Florastor [Yeast] Hives  . Penicillins Other (See Comments)    Has patient had a PCN reaction causing immediate rash, facial/tongue/throat swelling, SOB or lightheadedness with hypotension: unknown Has patient had a PCN reaction causing severe rash involving mucus membranes or skin necrosis: unknown Has patient had a PCN reaction that required hospitalization unknown Has patient had a PCN reaction occurring within the last 10 years:unknown If all of the above answers are "NO", then may proceed with Cephalosporin use.  Marland Kitchen Zenpep [Pancrelipase (Lip-Prot-Amyl)] Hives    Chief Complaint  Patient presents with  . Follow-up    Follow Up Status    HPI:  Speech therapy reports that she is aspirating on her own salvia. She is not a candidate at this time for a MBS. She is coughing most of the time. More than likely this does represent an aspiration pneumonia.  I have had a prolonged discussion with her family regarding her poor status and poor prognosis.  Her family does not want antibiotics at this time; but would for her cough to be treated and to be kept comfortable.   Past Medical History:  Diagnosis Date  . Arthritis   . Diabetes mellitus without complication ([place])    "borderline"  . Diverticulosis of colon   . Dysphagia due to recent cerebrovascular accident (CVA) 10/21/2019  . High cholesterol   . Hypertension   . Hypothyroidism   . Pancreatitis     Past Surgical History:  Procedure Laterality Date  . ABDOMINAL HYSTERECTOMY      partial  . APPENDECTOMY    . bladder tac    . CHOLECYSTECTOMY  1999   cholelithiasis  . COLONOSCOPY  01/2007   Dr Olevia Perches: normal  . COLONOSCOPY  12/2000   Dr Deatra Ina- diverticulosis  . ESOPHAGOGASTRODUODENOSCOPY     Dr Damaris Hippo cannot remember  . EUS N/A 03/24/2012   Dr. Paulita Fujita: suspected passed CBD stone as cause of acute pancreatitis, suspective chronic pancreatitis.   Marland Kitchen EXCISION OF BREAST BIOPSY    . HIP PINNING,CANNULATED Left 01/15/2015   Procedure: CANNULATED HIP PINNING/LEFT;  Surgeon: Renette Butters, MD;  Location: [place];  Service: Orthopedics;  Laterality: Left;  Marland Kitchen VEIN SURGERY      Social History   Socioeconomic History  . Marital status: Married    Spouse name: Not on file  . Number of children: 1  . Years of education: Not on file  . Highest education level: Not on file  Occupational History  . Occupation: retired, tobacco field  Tobacco Use  . Smoking status: Never Smoker  . Smokeless tobacco: Never Used  Vaping Use  . Vaping Use: Never used  Substance and Sexual Activity  . Alcohol use: No    Alcohol/week: 0.0 standard drinks  . Drug use: No  . Sexual activity: Not on file  Other Topics Concern  . Not on file  Social History Narrative   Lost 1 son age 54-?MI   1 living son         Social Determinants of  Health   Financial Resource Strain:   . Difficulty of Paying Living Expenses: Not on file  Food Insecurity:   . Worried About Charity fundraiser in the Last Year: Not on file  . Ran Out of Food in the Last Year: Not on file  Transportation Needs:   . Lack of Transportation (Medical): Not on file  . Lack of Transportation (Non-Medical): Not on file  Physical Activity:   . Days of Exercise per Week: Not on file  . Minutes of Exercise per Session: Not on file  Stress:   . Feeling of Stress : Not on file  Social Connections:   . Frequency of Communication with Friends and Family: Not on file  . Frequency of Social Gatherings with Friends and  Family: Not on file  . Attends Religious Services: Not on file  . Active Member of Clubs or Organizations: Not on file  . Attends Archivist Meetings: Not on file  . Marital Status: Not on file  Intimate Partner Violence:   . Fear of Current or Ex-Partner: Not on file  . Emotionally Abused: Not on file  . Physically Abused: Not on file  . Sexually Abused: Not on file   Family History  Problem Relation Age of Onset  . Diabetes Father   . Diabetes Mother   . Cancer Mother        ovarian  . Cancer Sister 30       breast  . Cancer Sister 52       bone      VITAL SIGNS BP 134/81   Pulse 84   Temp 98.1 F (36.7 C)   Resp 20   Ht 5\' 2"  (1.575 m)   Wt 130 lb (59 kg)   SpO2 94%   BMI 23.78 kg/m   Outpatient Encounter Medications as of 11/14/2019  Medication Sig  . acetaminophen (TYLENOL) 325 MG tablet Take 650 mg by mouth every 6 (six) hours.  Marland Kitchen amLODipine (NORVASC) 5 MG tablet Take 5 mg by mouth every morning.   Marland Kitchen atorvastatin (LIPITOR) 40 MG tablet Take 1 tablet (40 mg total) by mouth daily.  . carboxymethylcellulose (REFRESH PLUS) 0.5 % SOLN Place 1 drop into both eyes 3 (three) times daily as needed (for dry eyes).   . clopidogrel (PLAVIX) 75 MG tablet Take 1 tablet (75 mg total) by mouth daily.  . collagenase (SANTYL) ointment Apply 1 application topically daily. 250 unit/gram apply to sacral wound per treatment orders  . dorzolamidel-timolol (COSOPT PF) 22.3-6.8 MG/ML SOLN ophthalmic solution Place 1 drop into both eyes 2 (two) times daily. Wait 5 minutes between multiple drops in the same eye  . famotidine (PEPCID) 40 MG tablet Take 40 mg by mouth 2 (two) times daily.  . fexofenadine (ALLEGRA) 180 MG tablet Take 180 mg by mouth daily.  Marland Kitchen latanoprost (XALATAN) 0.005 % ophthalmic solution Place 1 drop into both eyes at bedtime.  Marland Kitchen levothyroxine (SYNTHROID) 100 MCG tablet Take 100 mcg by mouth daily.  Marland Kitchen lisinopril (PRINIVIL,ZESTRIL) 2.5 MG tablet Take 2.5 mg by  mouth daily.   . metFORMIN (GLUCOPHAGE-XR) 500 MG 24 hr tablet Take 500 mg by mouth daily with breakfast.   . modafinil (PROVIGIL) 100 MG tablet Take 100 mg by mouth daily. Special Instructions: somnolence  . montelukast (SINGULAIR) 10 MG tablet Take 10 mg by mouth daily.  . NON FORMULARY Diet: _____ Regular, _X__Pureed, __X__ Honey ______ NAS, _______Consistent Carbohydrate, _______NPO _____Other Special Instructions: Pureed Diet with  Honey Thick Liquids  . Nutritional Supplements (NUTRITIONAL SUPPLEMENT PO) Take 1 each by mouth daily. Magic Cup  . potassium chloride SA (K-DUR,KLOR-CON) 20 MEQ tablet Take 20 mEq by mouth 2 (two) times daily.   . sertraline (ZOLOFT) 50 MG tablet Take 50 mg by mouth daily.   No facility-administered encounter medications on file as of 11/14/2019.     SIGNIFICANT DIAGNOSTIC EXAMS   PREVIOUS   10-14-19: ct of head:  1. No evidence of acute intracranial abnormality. 2. ASPECTS is 10. 3. Mild chronic small vessel ischemic disease. 4. Unchanged large left mastoid effusion.  10-14-19: ct angio of head and neck:  1. No emergent large vessel occlusion. 2. Intracranial atherosclerosis with severe bilateral PCA stenoses. No significant proximal anterior circulation stenosis. 3. Widely patent cervical carotid and vertebral arteries. 4. Negative CTP. 5. Aortic Atherosclerosis   10-15-19: 2-d echo:  Left ventricular ejection fraction, by estimation, is 60 to 65%. The  left ventricle has normal function. The left ventricle has no regional  wall motion abnormalities. Left ventricular diastolic parameters were  normal.   10-16-19: MRI of brain:  1. Acute infarct of the left pons. No hemorrhage or mass effect. 2. Generalized atrophy and findings of chronic small vessel disease.   10-18-19: swallow study: Dysphagia 1 (Puree) solids;Nectar thick liquid   NO NEW EXAMS.    LABS REVIEWED PREVIOUS  10-14-19: wbc 8.6; hgb 13.8; hct 40.5; mcv 90.8 plt 252 glucose 160; bun  14; creat 0.59; k+ 3.7; na++ 132; ca 9.1 liver normal albumin 4.1 vit B 12: 744 free t4: 1.00 hgb a1c 5.8; chol 203; ldl 134; trig 66; hdl 56 10-17-19: wbc 8.2; hgb 11.9; hct 36.9; mcv 93.4 plt 188 glucose 98; bun 7; creat 0.56; k+ 3.0; na++ 137; ca 8.0 liver normal albumin 2.7 10/27/2019: glucose 115; bun 6; creat 0.52; k+ 3.2; na++ 136; ca 8.1 10-24-19: glucose 152; bun 21; creat 0.49; k+ 3.2; na++ 147; ca 8.9  10-28-19: glucose 206; bun 29; creat 0.69; k+ 4.1; na++ 150 ca 9.0 10-31-19: glucose 167; bun 21; creat 0.51; k+ 3.8; na++ 147; ca 8.8  11-02-19; glucose 147; bun 16; creat 0.49; k+ 4.0; na++ 141; ca 8.7  NO NEW LABS.    Review of Systems  Reason unable to perform ROS: aphasia     Physical Exam Constitutional:      General: She is not in acute distress.    Appearance: She is well-developed. She is not diaphoretic.  Neck:     Thyroid: No thyromegaly.  Cardiovascular:     Rate and Rhythm: Normal rate and regular rhythm.     Pulses: Normal pulses.     Heart sounds: Normal heart sounds.  Pulmonary:     Effort: Pulmonary effort is normal. No respiratory distress.     Breath sounds: Rhonchi and rales present.  Abdominal:     General: Bowel sounds are normal. There is no distension.     Palpations: Abdomen is soft.     Tenderness: There is no abdominal tenderness.  Musculoskeletal:     Cervical back: Neck supple.     Right lower leg: No edema.     Left lower leg: No edema.     Comments: Right side flaccid hemiplegia       Lymphadenopathy:     Cervical: No cervical adenopathy.  Skin:    General: Skin is warm and dry.  Neurological:     Mental Status: She is alert.     Comments: Is  awake; confused        ASSESSMENT/ PLAN:  TODAY  1. Failure to thrive in adult 2. Aspiration pneumonia; unspecified aspiration pneumonia type unspecified laterality unspecified part of lung 3. Dysphagia due to recent cerebrovascular accident  4. Acute CVA (cerebrovascular accident)  Her  status is declining Will stop lipitor and metformin Will begin tussionex 5 cc twice daily for her cough Will continue to monitor her status.  Her family desires only for symptoms to be treated; no abt.   MD is aware of resident's narcotic use and is in agreement with current plan of care. We will attempt to wean resident as appropriate.  Ok Edwards NP [facility]  Contact [phone] Monday through Friday 8am- 5pm  After hours call [phone]

## 2019-11-15 ENCOUNTER — Other Ambulatory Visit: Payer: Self-pay | Admitting: Adult Health

## 2019-11-15 ENCOUNTER — Encounter: Payer: Self-pay | Admitting: Adult Health

## 2019-11-15 ENCOUNTER — Other Ambulatory Visit (HOSPITAL_COMMUNITY)
Admission: RE | Admit: 2019-11-15 | Discharge: 2019-11-15 | Disposition: A | Discharge status: Home / Self Care | Payer: PPO | Source: Skilled Nursing Facility | Attending: Adult Health | Admitting: Adult Health

## 2019-11-15 ENCOUNTER — Non-Acute Institutional Stay (SKILLED_NURSING_FACILITY): Payer: PPO | Admitting: Adult Health

## 2019-11-15 DIAGNOSIS — I639 Cerebral infarction, unspecified: Secondary | ICD-10-CM | POA: Diagnosis not present

## 2019-11-15 DIAGNOSIS — R627 Adult failure to thrive: Secondary | ICD-10-CM | POA: Diagnosis not present

## 2019-11-15 DIAGNOSIS — E876 Hypokalemia: Secondary | ICD-10-CM | POA: Diagnosis present

## 2019-11-15 DIAGNOSIS — J690 Pneumonitis due to inhalation of food and vomit: Secondary | ICD-10-CM | POA: Insufficient documentation

## 2019-11-15 LAB — BASIC METABOLIC PANEL
Anion gap: 8 (ref 5–15)
BUN: 31 mg/dL — ABNORMAL HIGH (ref 8–23)
CO2: 28 mmol/L (ref 22–32)
Calcium: 9 mg/dL (ref 8.9–10.3)
Chloride: 122 mmol/L — ABNORMAL HIGH (ref 98–111)
Creatinine, Ser: 0.77 mg/dL (ref 0.44–1.00)
GFR calc non Af Amer: >60 mL/min (ref >60)
Glucose, Bld: 178 mg/dL — ABNORMAL HIGH (ref 70–99)
Potassium: 4.2 mmol/L (ref 3.5–5.1)
Sodium: 158 mmol/L — ABNORMAL HIGH (ref 135–145)

## 2019-11-15 MED ORDER — MORPHINE SULFATE (CONCENTRATE) 20 MG/ML PO SOLN
5.0000 mg | ORAL | 0 refills | Status: AC | PRN
Start: 2019-11-15 — End: ?

## 2019-11-15 NOTE — Progress Notes (Signed)
Location:    [facility] Room Number: 128/P Place of Service:  SNF (31)   CODE STATUS: DNR  Allergies  Allergen Reactions   Sulfamethoxazole-Trimethoprim Rash   Alendronate     Unknown reaction   Cephalexin Other (See Comments)    Unknown reaction Unknown reaction Unknown reaction   Clindamycin/Lincomycin Hives   Creon [Pancreatin] Hives   Doxycycline Other (See Comments)    Unknown reaction Unknown reaction   Florastor [Yeast] Hives   Penicillins Other (See Comments)    Has patient had a PCN reaction causing immediate rash, facial/tongue/throat swelling, SOB or lightheadedness with hypotension: unknown Has patient had a PCN reaction causing severe rash involving mucus membranes or skin necrosis: unknown Has patient had a PCN reaction that required hospitalization unknown Has patient had a PCN reaction occurring within the last 10 years:unknown If all of the above answers are "NO", then may proceed with Cephalosporin use.   Zenpep [Pancrelipase (Lip-Prot-Amyl)] Hives    Chief Complaint  Patient presents with   Acute Visit    End of Life Visit    HPI:  Her status has declined. Her po intake is very poor with a sodium ot 158. She is restless; appears to be uncomfortable. I have spoken with her family at great length the focus of her is for comfort. Her family is in agreement to stop all medications that do not provide for her comfort.    Past Medical History:  Diagnosis Date   Arthritis    Diabetes mellitus without complication ([place])    "borderline"   Diverticulosis of colon    Dysphagia due to recent cerebrovascular accident (CVA) 10/21/2019   High cholesterol    Hypertension    Hypothyroidism    Pancreatitis     Past Surgical History:  Procedure Laterality Date   ABDOMINAL HYSTERECTOMY     partial   APPENDECTOMY     bladder tac     CHOLECYSTECTOMY  1999   cholelithiasis   COLONOSCOPY  01/2007   Dr Olevia Perches:  normal   COLONOSCOPY  12/2000   Dr Deatra Ina- diverticulosis   ESOPHAGOGASTRODUODENOSCOPY     Dr Damaris Hippo cannot remember   EUS N/A 03/24/2012   Dr. Paulita Fujita: suspected passed CBD stone as cause of acute pancreatitis, suspective chronic pancreatitis.    EXCISION OF BREAST BIOPSY     HIP PINNING,CANNULATED Left 01/15/2015   Procedure: CANNULATED HIP PINNING/LEFT;  Surgeon: Renette Butters, MD;  Location: [place];  Service: Orthopedics;  Laterality: Left;   VEIN SURGERY      Social History   Socioeconomic History   Marital status: Married    Spouse name: Not on file   Number of children: 1   Years of education: Not on file   Highest education level: Not on file  Occupational History   Occupation: retired, tobacco field  Tobacco Use   Smoking status: Never Smoker   Smokeless tobacco: Never Used  Scientific laboratory technician Use: Never used  Substance and Sexual Activity   Alcohol use: No    Alcohol/week: 0.0 standard drinks   Drug use: No   Sexual activity: Not on file  Other Topics Concern   Not on file  Social History Narrative   Lost 1 son age 6-?MI   1 living son         Social Determinants of Health   Financial Resource Strain:    Difficulty of Paying Living Expenses: Not on file  Food Insecurity:  Worried About Charity fundraiser in the Last Year: Not on file   [employer] of Food in the Last Year: Not on file  Transportation Needs:    Lack of Transportation (Medical): Not on file   Lack of Transportation (Non-Medical): Not on file  Physical Activity:    Days of Exercise per Week: Not on file   Minutes of Exercise per Session: Not on file  Stress:    Feeling of Stress : Not on file  Social Connections:    Frequency of Communication with Friends and Family: Not on file   Frequency of Social Gatherings with Friends and Family: Not on file   Attends Religious Services: Not on file   Active Member of [place] or Organizations: Not on file    Attends Archivist Meetings: Not on file   Marital Status: Not on file  Intimate Partner Violence:    Fear of Current or Ex-Partner: Not on file   Emotionally Abused: Not on file   Physically Abused: Not on file   Sexually Abused: Not on file   Family History  Problem Relation Age of Onset   Diabetes Father    Diabetes Mother    Cancer Mother        ovarian   Cancer Sister 39       breast   Cancer Sister 34       bone      VITAL SIGNS BP 113/78    Pulse 95    Temp 97.7 F (36.5 C)    Resp 20    Ht 5\' 2"  (1.575 m)    Wt 130 lb (59 kg)    SpO2 97%    BMI 23.78 kg/m   Outpatient Encounter Medications as of 11/15/2019  Medication Sig   acetaminophen (TYLENOL) 325 MG tablet Take 650 mg by mouth every 6 (six) hours.   carboxymethylcellulose (REFRESH PLUS) 0.5 % SOLN Place 1 drop into both eyes 3 (three) times daily as needed (for dry eyes).    chlorpheniramine-HYDROcodone (TUSSIONEX PENNKINETIC ER) 10-8 MG/5ML SUER Take 5 mLs by mouth 2 (two) times daily.   collagenase (SANTYL) ointment Apply 1 application topically daily. 250 unit/gram apply to sacral wound per treatment orders   dorzolamidel-timolol (COSOPT PF) 22.3-6.8 MG/ML SOLN ophthalmic solution Place 1 drop into both eyes 2 (two) times daily. Wait 5 minutes between multiple drops in the same eye   famotidine (PEPCID) 40 MG tablet Take 40 mg by mouth 2 (two) times daily.   fexofenadine (ALLEGRA) 180 MG tablet Take 180 mg by mouth daily.   latanoprost (XALATAN) 0.005 % ophthalmic solution Place 1 drop into both eyes at bedtime.   modafinil (PROVIGIL) 100 MG tablet Take 100 mg by mouth daily. Special Instructions: somnolence   montelukast (SINGULAIR) 10 MG tablet Take 10 mg by mouth daily.   NON FORMULARY Diet: _____ Regular, _X__Pureed, __X__ Honey ______ NAS, _______Consistent Carbohydrate, _______NPO _____Other Special Instructions: Pureed Diet with Honey Thick Liquids   Nutritional  Supplements (NUTRITIONAL SUPPLEMENT PO) Take 1 each by mouth daily. Magic Cup   sertraline (ZOLOFT) 50 MG tablet Take 50 mg by mouth daily.   [DISCONTINUED] amLODipine (NORVASC) 5 MG tablet Take 5 mg by mouth every morning.    [DISCONTINUED] atorvastatin (LIPITOR) 40 MG tablet Take 1 tablet (40 mg total) by mouth daily.   [DISCONTINUED] clopidogrel (PLAVIX) 75 MG tablet Take 1 tablet (75 mg total) by mouth daily.   [DISCONTINUED] levothyroxine (SYNTHROID) 100 MCG tablet Take  100 mcg by mouth daily.   [DISCONTINUED] lisinopril (PRINIVIL,ZESTRIL) 2.5 MG tablet Take 2.5 mg by mouth daily.    [DISCONTINUED] metFORMIN (GLUCOPHAGE-XR) 500 MG 24 hr tablet Take 500 mg by mouth daily with breakfast.    [DISCONTINUED] potassium chloride SA (K-DUR,KLOR-CON) 20 MEQ tablet Take 20 mEq by mouth 2 (two) times daily.    No facility-administered encounter medications on file as of 11/15/2019.     SIGNIFICANT DIAGNOSTIC EXAMS   PREVIOUS   10-14-19: ct of head:  1. No evidence of acute intracranial abnormality. 2. ASPECTS is 10. 3. Mild chronic small vessel ischemic disease. 4. Unchanged large left mastoid effusion.  10-14-19: ct angio of head and neck:  1. No emergent large vessel occlusion. 2. Intracranial atherosclerosis with severe bilateral PCA stenoses. No significant proximal anterior circulation stenosis. 3. Widely patent cervical carotid and vertebral arteries. 4. Negative CTP. 5. Aortic Atherosclerosis   10-15-19: 2-d echo:  Left ventricular ejection fraction, by estimation, is 60 to 65%. The  left ventricle has normal function. The left ventricle has no regional  wall motion abnormalities. Left ventricular diastolic parameters were  normal.   10-16-19: MRI of brain:  1. Acute infarct of the left pons. No hemorrhage or mass effect. 2. Generalized atrophy and findings of chronic small vessel disease.   10-18-19: swallow study: Dysphagia 1 (Puree) solids;Nectar thick liquid   NO NEW EXAMS.     LABS REVIEWED PREVIOUS  10-14-19: wbc 8.6; hgb 13.8; hct 40.5; mcv 90.8 plt 252 glucose 160; bun 14; creat 0.59; k+ 3.7; na++ 132; ca 9.1 liver normal albumin 4.1 vit B 12: 744 free t4: 1.00 hgb a1c 5.8; chol 203; ldl 134; trig 66; hdl 56 10-17-19: wbc 8.2; hgb 11.9; hct 36.9; mcv 93.4 plt 188 glucose 98; bun 7; creat 0.56; k+ 3.0; na++ 137; ca 8.0 liver normal albumin 2.7 10/22/2019: glucose 115; bun 6; creat 0.52; k+ 3.2; na++ 136; ca 8.1 10-24-19: glucose 152; bun 21; creat 0.49; k+ 3.2; na++ 147; ca 8.9  10-28-19: glucose 206; bun 29; creat 0.69; k+ 4.1; na++ 150 ca 9.0 10-31-19: glucose 167; bun 21; creat 0.51; k+ 3.8; na++ 147; ca 8.8  11-02-19; glucose 147; bun 16; creat 0.49; k+ 4.0; na++ 141; ca 8.7  NO NEW LABS.    Review of Systems  Reason unable to perform ROS: aphasia     Physical Exam Constitutional:      General: She is not in acute distress.    Appearance: She is well-developed. She is not diaphoretic.     Comments: Restless   Neck:     Thyroid: No thyromegaly.  Cardiovascular:     Rate and Rhythm: Normal rate and regular rhythm.     Pulses: Normal pulses.     Heart sounds: Normal heart sounds.  Pulmonary:     Effort: Pulmonary effort is normal. No respiratory distress.     Breath sounds: Rhonchi and rales present.  Abdominal:     General: Bowel sounds are normal. There is no distension.     Palpations: Abdomen is soft.     Tenderness: There is no abdominal tenderness.  Musculoskeletal:     Cervical back: Neck supple.     Right lower leg: No edema.     Left lower leg: No edema.     Comments: Right side flaccid hemiplegia        Lymphadenopathy:     Cervical: No cervical adenopathy.  Skin:    General: Skin is warm and dry.  Neurological:     Comments: Is aware   Psychiatric:     Comments: Restless        ASSESSMENT/ PLAN:  TODAY  1. Failure to thrive in adult 2. Acute CVA (cerebrovasuclar accident)  Will stop the following medications: norvasc  plavix synthroid lisinopril k+  Will begin roxanol 5 mg every 2 hours as needed for indications of pain or distress Will monitor her status.   MD is aware of resident's narcotic use and is in agreement with current plan of care. We will attempt to wean resident as appropriate.  Ok Edwards NP [facility]  Contact [phone] Monday through Friday 8am- 5pm  After hours call [phone]

## 2019-11-16 ENCOUNTER — Other Ambulatory Visit: Payer: Self-pay | Admitting: Adult Health

## 2019-11-21 ENCOUNTER — Inpatient Hospital Stay: Payer: PPO | Admitting: Adult Health

## 2019-12-12 DEATH — deceased
